# Patient Record
Sex: Male | Born: 1960 | Race: Black or African American | Hispanic: No | Marital: Married | Smoking: Former smoker
Health system: Southern US, Community
[De-identification: ages and names within clinical notes are randomized; demographics above are authoritative.]

## PROBLEM LIST (undated history)

## (undated) DIAGNOSIS — J9811 Atelectasis: Secondary | ICD-10-CM

## (undated) DIAGNOSIS — I10 Essential (primary) hypertension: Secondary | ICD-10-CM

## (undated) HISTORY — PX: COLONOSCOPY: SHX174

---

## 1998-07-19 ENCOUNTER — Encounter: Admission: RE | Admit: 1998-07-19 | Discharge: 1998-10-17 | Payer: Self-pay | Admitting: Orthopedic Surgery

## 2002-10-15 ENCOUNTER — Emergency Department (HOSPITAL_COMMUNITY): Admission: EM | Admit: 2002-10-15 | Discharge: 2002-10-15 | Payer: Self-pay | Admitting: Emergency Medicine

## 2002-10-15 ENCOUNTER — Encounter: Payer: Self-pay | Admitting: Emergency Medicine

## 2004-01-03 ENCOUNTER — Encounter: Admission: RE | Admit: 2004-01-03 | Discharge: 2004-01-03 | Payer: Self-pay | Admitting: *Deleted

## 2005-12-07 ENCOUNTER — Emergency Department (HOSPITAL_COMMUNITY): Admission: EM | Admit: 2005-12-07 | Discharge: 2005-12-07 | Payer: Self-pay | Admitting: Family Medicine

## 2013-12-23 ENCOUNTER — Emergency Department (HOSPITAL_COMMUNITY): Payer: Self-pay

## 2013-12-23 ENCOUNTER — Encounter (HOSPITAL_COMMUNITY): Payer: Self-pay | Admitting: Emergency Medicine

## 2013-12-23 ENCOUNTER — Emergency Department (HOSPITAL_COMMUNITY)
Admission: EM | Admit: 2013-12-23 | Discharge: 2013-12-23 | Disposition: A | Discharge status: Home / Self Care | Payer: Self-pay | Attending: Emergency Medicine | Admitting: Emergency Medicine

## 2013-12-23 ENCOUNTER — Emergency Department (INDEPENDENT_AMBULATORY_CARE_PROVIDER_SITE_OTHER)
Admission: EM | Admit: 2013-12-23 | Discharge: 2013-12-23 | Disposition: A | Discharge status: Nursing Home (Includes ICF) | Payer: Self-pay | Source: Home / Self Care

## 2013-12-23 DIAGNOSIS — Z79899 Other long term (current) drug therapy: Secondary | ICD-10-CM | POA: Insufficient documentation

## 2013-12-23 DIAGNOSIS — R100 Acute abdomen: Secondary | ICD-10-CM

## 2013-12-23 DIAGNOSIS — R109 Unspecified abdominal pain: Secondary | ICD-10-CM

## 2013-12-23 DIAGNOSIS — F17200 Nicotine dependence, unspecified, uncomplicated: Secondary | ICD-10-CM | POA: Insufficient documentation

## 2013-12-23 DIAGNOSIS — R1013 Epigastric pain: Secondary | ICD-10-CM | POA: Insufficient documentation

## 2013-12-23 DIAGNOSIS — I10 Essential (primary) hypertension: Secondary | ICD-10-CM | POA: Insufficient documentation

## 2013-12-23 DIAGNOSIS — R1110 Vomiting, unspecified: Secondary | ICD-10-CM

## 2013-12-23 DIAGNOSIS — K2920 Alcoholic gastritis without bleeding: Secondary | ICD-10-CM | POA: Insufficient documentation

## 2013-12-23 HISTORY — DX: Essential (primary) hypertension: I10

## 2013-12-23 LAB — URINALYSIS, ROUTINE W REFLEX MICROSCOPIC
Bilirubin Urine: NEGATIVE
Glucose, UA: NEGATIVE mg/dL
Ketones, ur: >80 mg/dL — AB
Leukocytes, UA: NEGATIVE
Protein, ur: 100 mg/dL — AB
Specific Gravity, Urine: 1.029 (ref 1.005–1.030)
pH: 5.5 (ref 5.0–8.0)

## 2013-12-23 LAB — CBC WITH DIFFERENTIAL/PLATELET
Basophils Absolute: 0 10*3/uL (ref 0.0–0.1)
Eosinophils Absolute: 0 10*3/uL (ref 0.0–0.7)
Eosinophils Relative: 0 % (ref 0–5)
HCT: 45.3 % (ref 39.0–52.0)
Hemoglobin: 15.6 g/dL (ref 13.0–17.0)
Lymphocytes Relative: 5 % — ABNORMAL LOW (ref 12–46)
MCH: 33.2 pg (ref 26.0–34.0)
MCV: 96.4 fL (ref 78.0–100.0)
Monocytes Relative: 4 % (ref 3–12)
Neutro Abs: 14.8 10*3/uL — ABNORMAL HIGH (ref 1.7–7.7)
Platelets: 294 10*3/uL (ref 150–400)
RBC: 4.7 MIL/uL (ref 4.22–5.81)
RDW: 13.1 % (ref 11.5–15.5)
WBC: 16.3 10*3/uL — ABNORMAL HIGH (ref 4.0–10.5)

## 2013-12-23 LAB — COMPREHENSIVE METABOLIC PANEL
ALT: 42 U/L (ref 0–53)
AST: 70 U/L — ABNORMAL HIGH (ref 0–37)
Albumin: 4.9 g/dL (ref 3.5–5.2)
BUN: 19 mg/dL (ref 6–23)
CO2: 22 mEq/L (ref 19–32)
Calcium: 9.3 mg/dL (ref 8.4–10.5)
Creatinine, Ser: 0.92 mg/dL (ref 0.50–1.35)
Sodium: 145 mEq/L (ref 137–147)
Total Bilirubin: 0.4 mg/dL (ref 0.3–1.2)
Total Protein: 8.5 g/dL — ABNORMAL HIGH (ref 6.0–8.3)

## 2013-12-23 LAB — URINE MICROSCOPIC-ADD ON

## 2013-12-23 MED ORDER — FENTANYL CITRATE 0.05 MG/ML IJ SOLN
100.0000 ug | Freq: Once | INTRAMUSCULAR | Status: AC
  Administered 2013-12-23: 100 ug via INTRAVENOUS
  Filled 2013-12-23: qty 2

## 2013-12-23 MED ORDER — ONDANSETRON HCL 4 MG/2ML IJ SOLN
4.0000 mg | Freq: Once | INTRAMUSCULAR | Status: AC
  Administered 2013-12-23: 4 mg via INTRAVENOUS
  Filled 2013-12-23: qty 2

## 2013-12-23 MED ORDER — FENTANYL CITRATE 0.05 MG/ML IJ SOLN
50.0000 ug | Freq: Once | INTRAMUSCULAR | Status: AC
  Administered 2013-12-23: 50 ug via INTRAVENOUS
  Filled 2013-12-23: qty 2

## 2013-12-23 MED ORDER — OXYCODONE-ACETAMINOPHEN 10-325 MG PO TABS: 1.0000 | ORAL_TABLET | ORAL | Status: DC | PRN

## 2013-12-23 MED ORDER — IOHEXOL 300 MG/ML  SOLN
100.0000 mL | Freq: Once | INTRAMUSCULAR | Status: AC | PRN
  Administered 2013-12-23: 100 mL via INTRAVENOUS

## 2013-12-23 MED ORDER — BISOPROLOL-HYDROCHLOROTHIAZIDE 10-6.25 MG PO TABS: 1.0000 | ORAL_TABLET | Freq: Every day | ORAL | Status: DC

## 2013-12-23 MED ORDER — KETOROLAC TROMETHAMINE 30 MG/ML IJ SOLN
30.0000 mg | Freq: Once | INTRAMUSCULAR | Status: AC
  Administered 2013-12-23: 30 mg via INTRAVENOUS
  Filled 2013-12-23: qty 1

## 2013-12-23 MED ORDER — PROMETHAZINE HCL 25 MG PO TABS: 25.0000 mg | ORAL_TABLET | Freq: Four times a day (QID) | ORAL | Status: DC | PRN

## 2013-12-23 MED ORDER — CYCLOBENZAPRINE HCL 10 MG PO TABS: 10.0000 mg | ORAL_TABLET | Freq: Three times a day (TID) | ORAL | Status: DC | PRN

## 2013-12-23 MED ORDER — SODIUM CHLORIDE 0.9 % IV BOLUS (SEPSIS)
1000.0000 mL | Freq: Once | INTRAVENOUS | Status: AC
  Administered 2013-12-23: 1000 mL via INTRAVENOUS

## 2013-12-23 MED ORDER — SODIUM CHLORIDE 0.9 % IV SOLN: Freq: Once | INTRAVENOUS | Status: DC

## 2013-12-23 NOTE — ED Notes (Signed)
Per EMS pt came from UC c/o vomiting and RLQ abdominal pain that started at approximately 1300 today. UC reports no visible blood in vomit. Pt denies diarrhea. Abdomen is rigid. EMS gave 4mg  zofran IV and approximately of NS IV. Pt reports nausea has improved.

## 2013-12-23 NOTE — ED Notes (Signed)
[facility] was contacted with request for transport of this patient, but no truck is currently available.  [facility] was contacted, and an ALS truck has been dispatched @ 14:48.

## 2013-12-23 NOTE — ED Provider Notes (Signed)
CSN: [id number]     Arrival date & time 12/23/13  1523 History   First MD Initiated Contact with Patient 12/23/13 1526     Chief Complaint  Patient presents with  . Abdominal Pain  . Emesis    HPI Pt reports he drinks 1 beer daily and last night he drank 2 cups of liquor with beer. Pt reports he vomited "all morning" more than 20 times - thinks it is due to drinking alcohol last night. Pt reports vomit was yellow after many episodes. No fever or chills.  No diarrhea.  No blood in emesis.  Past Medical History  Diagnosis Date  . Hypertension    History reviewed. No pertinent past surgical history. No family history on file. History  Substance Use Topics  . Smoking status: Current Every Day Smoker  . Smokeless tobacco: Not on file  . Alcohol Use: Yes     Comment: pt reports 1 beer daily    Review of Systems  All other systems reviewed and are negative.    Allergies  Asa  Home Medications   Current Outpatient Rx  Name  Route  Sig  Dispense  Refill  . bisoprolol-hydrochlorothiazide (ZIAC) 10-6.25 MG per tablet   Oral   Take 1 tablet by mouth daily.         . cholecalciferol (VITAMIN D) 1000 UNITS tablet   Oral   Take 1,000 Units by mouth daily.         . Cyanocobalamin (VITAMIN B-12 PO)   Oral   Take 1 tablet by mouth daily.         . vitamin C (ASCORBIC ACID) 500 MG tablet   Oral   Take 500 mg by mouth daily.         . vitamin E 200 UNIT capsule   Oral   Take 400 Units by mouth daily.         . cyclobenzaprine (FLEXERIL) 10 MG tablet   Oral   Take 1 tablet (10 mg total) by mouth 3 (three) times daily as needed for muscle spasms.   30 tablet   0   . oxyCODONE-acetaminophen (PERCOCET) 10-325 MG per tablet   Oral   Take 1 tablet by mouth every 4 (four) hours as needed for pain.   20 tablet   0   . promethazine (PHENERGAN) 25 MG tablet   Oral   Take 1 tablet (25 mg total) by mouth every 6 (six) hours as needed for nausea or vomiting.   30  tablet   0    BP 132/67  Pulse 104  Temp(Src) 98 F (36.7 C) (Oral)  Resp 16  SpO2 96% Physical Exam  Nursing note and vitals reviewed. Constitutional: He is oriented to person, place, and time. He appears well-developed and well-nourished. No distress.  HENT:  Head: Normocephalic and atraumatic.  Eyes: Pupils are equal, round, and reactive to light.  Neck: Normal range of motion.  Cardiovascular: Normal rate and intact distal pulses.   Pulmonary/Chest: No respiratory distress.  Abdominal: Normal appearance. He exhibits no distension. There is tenderness (epigastric). There is no rebound and no guarding.  Musculoskeletal: Normal range of motion.  Neurological: He is alert and oriented to person, place, and time. No cranial nerve deficit.  Skin: Skin is warm and dry. No rash noted.  Psychiatric: He has a normal mood and affect. His behavior is normal.    ED Course  Procedures (including critical care time)  Medications  fentaNYL (SUBLIMAZE)  injection 100 mcg (100 mcg Intravenous Given 12/23/13 1540)  ondansetron (ZOFRAN) injection 4 mg (4 mg Intravenous Given 12/23/13 1540)  iohexol (OMNIPAQUE) 300 MG/ML solution 100 mL (100 mLs Intravenous Contrast Given 12/23/13 1842)  fentaNYL (SUBLIMAZE) injection 100 mcg (100 mcg Intravenous Given 12/23/13 1938)  sodium chloride 0.9 % bolus 1,000 mL (0 mLs Intravenous Stopped 12/23/13 2036)  ondansetron (ZOFRAN) injection 4 mg (4 mg Intravenous Given 12/23/13 2000)  fentaNYL (SUBLIMAZE) injection 50 mcg (50 mcg Intravenous Given 12/23/13 2118)  ketorolac (TORADOL) 30 MG/ML injection 30 mg (30 mg Intravenous Given 12/23/13 2118)    Labs Review Labs Reviewed  COMPREHENSIVE METABOLIC PANEL - Abnormal; Notable for the following:    Glucose, Bld 137 (*)    Total Protein 8.5 (*)    AST 70 (*)    All other components within normal limits  URINALYSIS, ROUTINE W REFLEX MICROSCOPIC - Abnormal; Notable for the following:    Hgb urine dipstick  TRACE (*)    Ketones, ur >80 (*)    Protein, ur 100 (*)    All other components within normal limits  CBC WITH DIFFERENTIAL - Abnormal; Notable for the following:    WBC 16.3 (*)    Neutrophils Relative % 91 (*)    Lymphocytes Relative 5 (*)    Neutro Abs 14.8 (*)    All other components within normal limits  LIPASE, BLOOD  URINE MICROSCOPIC-ADD ON   Imaging Review Ct Abdomen Pelvis W Contrast  12/23/2013   CLINICAL DATA:  Abdominal pain right lower quadrant.  EXAM: CT ABDOMEN AND PELVIS WITH CONTRAST  TECHNIQUE: Multidetector CT imaging of the abdomen and pelvis was performed using the standard protocol following bolus administration of intravenous contrast.  CONTRAST:  OMNIPAQUE IOHEXOL 300 MG/ML  SOLN  COMPARISON:  None.  FINDINGS: Lung bases are unremarkable.  Abdominal images demonstrate minimal fatty infiltration of the liver without focal mass. There is a normal spleen, pancreas, gallbladder and adrenal glands. Kidneys are normal in size as there is a sub cm hypodensity over the mid pole of the right kidney likely a cyst but too small to characterize. Ureters are within normal. The appendix is normal. There is no free fluid or inflammatory change in the right lower quadrant. There is no evidence of bowel obstruction.  Pelvic images demonstrate no focal abnormality. There is normal spondylosis of the spine.  IMPRESSION: No acute findings in the abdomen/pelvis. No evidence of acute appendicitis.  Minimal fatty infiltration of the liver.  Subcentimeter hypodensity over the mid pole the right kidney too small to characterize but likely a cyst.   Electronically Signed   By: Elberta Fortis M.D.   On: 12/23/2013 18:55   Dg Abd Acute W/chest  12/23/2013   CLINICAL DATA:  Abdominal pain. Nausea and vomiting. Hypertension. Smoker.  EXAM: ACUTE ABDOMEN SERIES (ABDOMEN 2 VIEW & CHEST 1 VIEW)  COMPARISON:  None.  FINDINGS: There is no evidence of dilated bowel loops or free intraperitoneal air. No  radiopaque calculi or other significant radiographic abnormality is seen. Heart size and mediastinal contours are within normal limits. Both lungs are clear.  IMPRESSION: Negative abdominal radiographs.  No acute cardiopulmonary disease.   Electronically Signed   By: Myles Rosenthal M.D.   On: 12/23/2013 16:14    After treatment in the ED the patient feels back to baseline and wants to go home.  MDM   1. Alcoholic gastritis   2. Flank pain   3. Vomiting  Nelia Shi, MD 12/25/13 2154

## 2013-12-23 NOTE — ED Provider Notes (Signed)
CSN: [id number]     Arrival date & time 12/23/13  1412 History   First MD Initiated Contact with Patient 12/23/13 1440     Chief Complaint  Patient presents with  . Abdominal Pain   (Consider location/radiation/quality/duration/timing/severity/associated sxs/prior Treatment) HPI Comments: 52 year old male started with profuse and persistent vomiting since proximal o'clock this morning. Early this afternoon he developed severe acute abdominal pain greatest on the right. He presents now with vomiting, moaning, groaning and writhing in severe pain.  Patient is a 52 y.o. male presenting with abdominal pain.  Abdominal Pain Associated symptoms: nausea and vomiting     Past Medical History  Diagnosis Date  . Hypertension    History reviewed. No pertinent past surgical history. No family history on file. History  Substance Use Topics  . Smoking status: Current Every Day Smoker  . Smokeless tobacco: Not on file  . Alcohol Use: Yes    Review of Systems  Constitutional: Positive for activity change and appetite change.  HENT: Negative.   Respiratory: Negative.   Cardiovascular: Negative.   Gastrointestinal: Positive for nausea, vomiting and abdominal pain. Negative for abdominal distention.  Genitourinary: Negative.   Neurological: Negative.     Allergies  Asa  Home Medications  No current outpatient prescriptions on file. BP 172/91  Pulse 122  Temp(Src) 98.8 F (37.1 C) (Oral)  Resp 26  SpO2 99% Physical Exam  Nursing note and vitals reviewed. Constitutional: He is oriented to person, place, and time. He appears well-developed and well-nourished. He appears distressed.  Neck: Normal range of motion. Neck supple.  Cardiovascular: Normal heart sounds.   tachycardic  Pulmonary/Chest: Breath sounds normal. He has no wheezes.  Hyperventilating, lungs are clear.  Abdominal: There is tenderness.  Abdomen is flat and rigid. Palpation to any portion of the abdomen produces  greater pain.  Musculoskeletal: He exhibits no edema.  Neurological: He is alert and oriented to person, place, and time. He exhibits normal muscle tone.  Skin:  Clammy and warm    ED Course  Procedures (including critical care time) Labs Review Labs Reviewed - No data to display Imaging Review No results found.    MDM   1. Acute abdomen   2. Abdominal pain     Transfer this 52 year old male with severe abdominal pain, rigid abdomen and vomiting suspected of having an acute or surgical abdomen to the [facility] by EMS    Hayden Rasmussen, NP 12/23/13 1451

## 2013-12-23 NOTE — ED Notes (Signed)
Pt completed oral contrast. CT notified

## 2013-12-23 NOTE — ED Notes (Signed)
52 yr old male is here today with complaints of sever RLQ pain that started this morning. Denies: SOB, chest pain

## 2013-12-23 NOTE — ED Notes (Signed)
Pt reports he drinks 1 beer daily and last night he drank 2 cups of liquor with beer. Pt reports he vomited "all morning" more than 20 times - thinks it is due to drinking alcohol last night. Pt reports vomit was yellow after many episodes.

## 2013-12-27 NOTE — ED Provider Notes (Signed)
Medical screening examination/treatment/procedure(s) were performed by a resident physician or non-physician practitioner and as the supervising physician I was immediately available for consultation/collaboration.  Gayland Nicol, MD    Logyn Dedominicis S Marquesha Robideau, MD 12/27/13 0844 

## 2017-02-26 ENCOUNTER — Encounter (HOSPITAL_COMMUNITY): Payer: Self-pay | Admitting: Emergency Medicine

## 2017-02-26 ENCOUNTER — Inpatient Hospital Stay (HOSPITAL_COMMUNITY): Payer: 59

## 2017-02-26 ENCOUNTER — Emergency Department (HOSPITAL_COMMUNITY): Payer: 59

## 2017-02-26 ENCOUNTER — Inpatient Hospital Stay (HOSPITAL_COMMUNITY)
Admission: EM | Admit: 2017-02-26 | Discharge: 2017-03-01 | DRG: 201 | Disposition: A | Discharge status: Home / Self Care | Payer: 59 | Attending: Family Medicine | Admitting: Family Medicine

## 2017-02-26 DIAGNOSIS — Z886 Allergy status to analgesic agent status: Secondary | ICD-10-CM | POA: Diagnosis not present

## 2017-02-26 DIAGNOSIS — I10 Essential (primary) hypertension: Secondary | ICD-10-CM

## 2017-02-26 DIAGNOSIS — Z9689 Presence of other specified functional implants: Secondary | ICD-10-CM

## 2017-02-26 DIAGNOSIS — J439 Emphysema, unspecified: Secondary | ICD-10-CM | POA: Diagnosis not present

## 2017-02-26 DIAGNOSIS — Z79899 Other long term (current) drug therapy: Secondary | ICD-10-CM | POA: Diagnosis not present

## 2017-02-26 DIAGNOSIS — F17200 Nicotine dependence, unspecified, uncomplicated: Secondary | ICD-10-CM | POA: Diagnosis not present

## 2017-02-26 DIAGNOSIS — J939 Pneumothorax, unspecified: Secondary | ICD-10-CM

## 2017-02-26 DIAGNOSIS — J9383 Other pneumothorax: Principal | ICD-10-CM | POA: Diagnosis present

## 2017-02-26 DIAGNOSIS — R0602 Shortness of breath: Secondary | ICD-10-CM | POA: Diagnosis present

## 2017-02-26 DIAGNOSIS — F17290 Nicotine dependence, other tobacco product, uncomplicated: Secondary | ICD-10-CM | POA: Diagnosis present

## 2017-02-26 DIAGNOSIS — R079 Chest pain, unspecified: Secondary | ICD-10-CM | POA: Diagnosis not present

## 2017-02-26 DIAGNOSIS — F17209 Nicotine dependence, unspecified, with unspecified nicotine-induced disorders: Secondary | ICD-10-CM | POA: Diagnosis not present

## 2017-02-26 DIAGNOSIS — J9311 Primary spontaneous pneumothorax: Secondary | ICD-10-CM

## 2017-02-26 LAB — CBC WITH DIFFERENTIAL/PLATELET
Basophils Absolute: 0 10*3/uL (ref 0.0–0.1)
Basophils Relative: 1 %
Eosinophils Absolute: 0.1 10*3/uL (ref 0.0–0.7)
Eosinophils Relative: 1 %
HEMATOCRIT: 41.5 % (ref 39.0–52.0)
Hemoglobin: 14.3 g/dL (ref 13.0–17.0)
LYMPHS ABS: 1.9 10*3/uL (ref 0.7–4.0)
Lymphocytes Relative: 39 %
MCH: 33 pg (ref 26.0–34.0)
MCHC: 34.5 g/dL (ref 30.0–36.0)
MCV: 95.8 fL (ref 78.0–100.0)
MONOS PCT: 11 %
Monocytes Absolute: 0.5 10*3/uL (ref 0.1–1.0)
NEUTROS ABS: 2.3 10*3/uL (ref 1.7–7.7)
NEUTROS PCT: 48 %
Platelets: 290 10*3/uL (ref 150–400)
RBC: 4.33 MIL/uL (ref 4.22–5.81)
RDW: 13.1 % (ref 11.5–15.5)
WBC: 4.8 10*3/uL (ref 4.0–10.5)

## 2017-02-26 LAB — RAPID URINE DRUG SCREEN, HOSP PERFORMED
Amphetamines: NOT DETECTED
Barbiturates: NOT DETECTED
Benzodiazepines: NOT DETECTED
Cocaine: NOT DETECTED
Opiates: NOT DETECTED
TETRAHYDROCANNABINOL: POSITIVE — AB

## 2017-02-26 LAB — I-STAT CHEM 8, ED
BUN: 11 mg/dL (ref 6–20)
CREATININE: 1.1 mg/dL (ref 0.61–1.24)
Calcium, Ion: 1.09 mmol/L — ABNORMAL LOW (ref 1.15–1.40)
Chloride: 105 mmol/L (ref 101–111)
Glucose, Bld: 124 mg/dL — ABNORMAL HIGH (ref 65–99)
HCT: 43 % (ref 39.0–52.0)
Hemoglobin: 14.6 g/dL (ref 13.0–17.0)
POTASSIUM: 3.5 mmol/L (ref 3.5–5.1)
Sodium: 142 mmol/L (ref 135–145)
TCO2: 27 mmol/L (ref 0–100)

## 2017-02-26 LAB — I-STAT CG4 LACTIC ACID, ED: Lactic Acid, Venous: 1.89 mmol/L (ref 0.5–1.9)

## 2017-02-26 LAB — COMPREHENSIVE METABOLIC PANEL
ALK PHOS: 47 U/L (ref 38–126)
ALT: 19 U/L (ref 17–63)
ANION GAP: 9 (ref 5–15)
AST: 30 U/L (ref 15–41)
Albumin: 4.4 g/dL (ref 3.5–5.0)
BILIRUBIN TOTAL: 0.5 mg/dL (ref 0.3–1.2)
BUN: 12 mg/dL (ref 6–20)
CALCIUM: 9.1 mg/dL (ref 8.9–10.3)
CO2: 27 mmol/L (ref 22–32)
Chloride: 105 mmol/L (ref 101–111)
Creatinine, Ser: 1.01 mg/dL (ref 0.61–1.24)
GFR calc non Af Amer: >60 mL/min (ref >60)
GLUCOSE: 123 mg/dL — AB (ref 65–99)
Potassium: 3.4 mmol/L — ABNORMAL LOW (ref 3.5–5.1)
Sodium: 141 mmol/L (ref 135–145)
TOTAL PROTEIN: 7.7 g/dL (ref 6.5–8.1)

## 2017-02-26 LAB — ETHANOL: Alcohol, Ethyl (B): 47 mg/dL — ABNORMAL HIGH (ref <5)

## 2017-02-26 LAB — I-STAT TROPONIN, ED: Troponin i, poc: 0 ng/mL (ref 0.00–0.08)

## 2017-02-26 LAB — MAGNESIUM
MAGNESIUM: 2.3 mg/dL (ref 1.7–2.4)
MAGNESIUM: 2.1 mg/dL (ref 1.7–2.4)

## 2017-02-26 LAB — PROTIME-INR
INR: 0.92
Prothrombin Time: 12.3 seconds (ref 11.4–15.2)

## 2017-02-26 LAB — TROPONIN I

## 2017-02-26 MED ORDER — IOPAMIDOL (ISOVUE-370) INJECTION 76%: 100.0000 mL | Freq: Once | INTRAVENOUS | Status: DC | PRN

## 2017-02-26 MED ORDER — LEVALBUTEROL HCL 0.63 MG/3ML IN NEBU
0.6300 mg | INHALATION_SOLUTION | Freq: Four times a day (QID) | RESPIRATORY_TRACT | Status: DC | PRN
Start: 2017-02-26 — End: 2017-03-01

## 2017-02-26 MED ORDER — TRAZODONE HCL 50 MG PO TABS: 50.0000 mg | ORAL_TABLET | Freq: Every evening | ORAL | Status: DC | PRN

## 2017-02-26 MED ORDER — ACETAMINOPHEN 650 MG RE SUPP
650.0000 mg | Freq: Four times a day (QID) | RECTAL | Status: DC | PRN
Start: 2017-02-26 — End: 2017-03-01

## 2017-02-26 MED ORDER — ENOXAPARIN SODIUM 40 MG/0.4ML ~~LOC~~ SOLN
40.0000 mg | SUBCUTANEOUS | Status: DC
  Administered 2017-02-26 – 2017-02-28 (×3): 40 mg via SUBCUTANEOUS
  Filled 2017-02-26 (×3): qty 0.4

## 2017-02-26 MED ORDER — LIDOCAINE HCL (PF) 1 % IJ SOLN
5.0000 mL | Freq: Once | INTRAMUSCULAR | Status: AC
  Administered 2017-02-26: 5 mL via INTRADERMAL
  Filled 2017-02-26: qty 30

## 2017-02-26 MED ORDER — FENTANYL CITRATE (PF) 100 MCG/2ML IJ SOLN
50.0000 ug | Freq: Once | INTRAMUSCULAR | Status: AC
  Administered 2017-02-26: 50 ug via INTRAVENOUS
  Filled 2017-02-26: qty 2

## 2017-02-26 MED ORDER — ONDANSETRON HCL 4 MG/2ML IJ SOLN
4.0000 mg | Freq: Four times a day (QID) | INTRAMUSCULAR | Status: DC | PRN
  Administered 2017-02-26: 4 mg via INTRAVENOUS
  Filled 2017-02-26: qty 2

## 2017-02-26 MED ORDER — IOPAMIDOL (ISOVUE-370) INJECTION 76%
100.0000 mL | Freq: Once | INTRAVENOUS | Status: AC | PRN
  Administered 2017-02-26: 100 mL via INTRAVENOUS

## 2017-02-26 MED ORDER — IOPAMIDOL (ISOVUE-370) INJECTION 76%
INTRAVENOUS | Status: AC
  Filled 2017-02-26: qty 100

## 2017-02-26 MED ORDER — SODIUM CHLORIDE 0.9% FLUSH
3.0000 mL | Freq: Two times a day (BID) | INTRAVENOUS | Status: DC
  Administered 2017-02-26 – 2017-03-01 (×7): 3 mL via INTRAVENOUS

## 2017-02-26 MED ORDER — HYDROMORPHONE HCL 1 MG/ML IJ SOLN
0.5000 mg | Freq: Once | INTRAMUSCULAR | Status: AC
  Administered 2017-02-26: 0.5 mg via INTRAVENOUS
  Filled 2017-02-26: qty 0.5

## 2017-02-26 MED ORDER — ACETAMINOPHEN 325 MG PO TABS: 650.0000 mg | ORAL_TABLET | Freq: Four times a day (QID) | ORAL | Status: DC | PRN

## 2017-02-26 MED ORDER — LOSARTAN POTASSIUM 50 MG PO TABS
100.0000 mg | ORAL_TABLET | Freq: Every day | ORAL | Status: DC
  Administered 2017-02-27 – 2017-03-01 (×3): 100 mg via ORAL
  Filled 2017-02-26 (×3): qty 2

## 2017-02-26 MED ORDER — SODIUM CHLORIDE 0.9 % IV BOLUS (SEPSIS)
1000.0000 mL | Freq: Once | INTRAVENOUS | Status: AC
  Administered 2017-02-26: 1000 mL via INTRAVENOUS

## 2017-02-26 MED ORDER — HYDROMORPHONE HCL 1 MG/ML IJ SOLN: 0.5000 mg | Freq: Once | INTRAMUSCULAR | Status: DC

## 2017-02-26 MED ORDER — ONDANSETRON HCL 4 MG PO TABS: 4.0000 mg | ORAL_TABLET | Freq: Four times a day (QID) | ORAL | Status: DC | PRN

## 2017-02-26 MED ORDER — OXYCODONE HCL 5 MG PO TABS
5.0000 mg | ORAL_TABLET | ORAL | Status: DC | PRN
  Administered 2017-02-26 – 2017-02-28 (×11): 5 mg via ORAL
  Filled 2017-02-26 (×11): qty 1

## 2017-02-26 NOTE — ED Provider Notes (Signed)
[facility]-EMERGENCY DEPT Provider Note   CSN: [id number] Arrival date & time: 02/26/17  4098     History   Chief Complaint Chief Complaint  Patient presents with  . Chest Pain     HPI   Ray [name] is a 55 y.o. male complaining of acute severe back pain radiating around to the right chest proximally 1 hour prior to arrival with associated lightheadedness, diaphoresis. He says it is pressure-like and severe, exacerbated by movement and palpation. Patient states he is a smoker, history of hypertension, no diabetes, hyperlipidemia, family history of ACS, history of DVT/PE. Nice abdominal pain, shortness of breath, syncope, similar prior episodes, history of cardiac issues.  Past Medical History:  Diagnosis Date  . Hypertension     There are no active problems to display for this patient.   History reviewed. No pertinent surgical history.    Home Medications    Prior to Admission medications   Medication Sig Start Date End Date Taking? Authorizing Provider  bisoprolol-hydrochlorothiazide (ZIAC) 10-6.25 MG per tablet Take 1 tablet by mouth daily. Patient taking differently: Take 1 tablet by mouth every morning.  12/23/13  Yes Nelva Nay, MD  losartan (COZAAR) 100 MG tablet Take 100 mg by mouth every morning. 02/16/17  Yes Historical Provider, MD  Multiple Vitamins-Minerals (MULTIVITAMIN MEN 50+ PO) Take 1 tablet by mouth daily.   Yes Historical Provider, MD  traZODone (DESYREL) 50 MG tablet Take 50 mg by mouth at bedtime as needed for sleep.  01/29/17  Yes Historical Provider, MD    Family History History reviewed. No pertinent family history.  Social History Social History  Substance Use Topics  . Smoking status: Current Every Day Smoker  . Smokeless tobacco: Never Used  . Alcohol use Yes     Comment: pt reports 1 beer daily     Allergies   Asa [aspirin]   Review of Systems Review of Systems  10 systems reviewed and found to be negative, except as noted in the  HPI.  Physical Exam Updated Vital Signs BP 149/87   Pulse 84   Temp 98.1 F (36.7 C) (Oral)   Resp 20   Ht 6' (1.829 m)   Wt 72.6 kg   SpO2 100%   BMI 21.70 kg/m   Physical Exam  Constitutional: He is oriented to person, place, and time. He appears well-developed and well-nourished. He appears distressed.  Mildly distress, diaphoretic  HENT:  Head: Normocephalic and atraumatic.  Mouth/Throat: Oropharynx is clear and moist.  Eyes: Conjunctivae and EOM are normal. Pupils are equal, round, and reactive to light.  Neck: Normal range of motion. No JVD present. No tracheal deviation present.  Cardiovascular: Normal rate, regular rhythm and intact distal pulses.   Radial pulse equal bilaterally  Pulmonary/Chest: Effort normal and breath sounds normal. No stridor. No respiratory distress. He has no wheezes. He has no rales. He exhibits no tenderness.  Abdominal: Soft. He exhibits no distension and no mass. There is no tenderness. There is no rebound and no guarding.  Musculoskeletal: Normal range of motion. He exhibits no edema or tenderness.  No calf asymmetry, superficial collaterals, palpable cords, edema, Homans sign negative bilaterally.    Neurological: He is alert and oriented to person, place, and time.  Skin: Skin is warm. He is diaphoretic.  Psychiatric: He has a normal mood and affect.  Nursing note and vitals reviewed.    ED Treatments / Results  Labs (all labs ordered are listed, but only abnormal results are displayed)  Labs Reviewed  COMPREHENSIVE METABOLIC PANEL - Abnormal; Notable for the following:       Result Value   Potassium 3.4 (*)    Glucose, Bld 123 (*)    All other components within normal limits  ETHANOL - Abnormal; Notable for the following:    Alcohol, Ethyl (B) 47 (*)    All other components within normal limits  I-STAT CHEM 8, ED - Abnormal; Notable for the following:    Glucose, Bld 124 (*)    Calcium, Ion 1.09 (*)    All other components  within normal limits  CBC WITH DIFFERENTIAL/PLATELET  PROTIME-INR  MAGNESIUM  RAPID URINE DRUG SCREEN, HOSP PERFORMED  I-STAT TROPOININ, ED  I-STAT CG4 LACTIC ACID, ED    EKG  EKG Interpretation  Date/Time:  Tuesday February 26 2017 06:50:52 EST Ventricular Rate:  103 PR Interval:    QRS Duration: 82 QT Interval:  329 QTC Calculation: 431 R Axis:   87 Text Interpretation:  Sinus tachycardia Prolonged PR interval rate is faster Confirmed by Erroll Luna [phone]) on 02/26/2017 6:56:44 AM       Radiology Dg Chest Portable 2 Views  Result Date: 02/26/2017 CLINICAL DATA:  Right chest tube insertion EXAM: CHEST  2 VIEW PORTABLE COMPARISON:  02/26/2017 FINDINGS: Interval placement of pleural pigtail chest tube. The locking pigtail loop is partially formed in the medial right hemithorax. Interval partial re-expansion of the right lung. Moderate right lateral basilar pneumothorax remains, approximately 15%. Heart is normal size. No confluent airspace opacities. IMPRESSION: Partial re-expansion of the right lung with placement of pigtail pleural drainage catheter. Approximately 15% right basilar pneumothorax remains. Electronically Signed   By: Charlett Nose M.D.   On: 02/26/2017 08:49   Dg Chest Port 1 View  Result Date: 02/26/2017 CLINICAL DATA:  Chest pain and shortness of breath. EXAM: PORTABLE CHEST 1 VIEW COMPARISON:  Single-view of the chest 12/23/2013. FINDINGS: The patient has a very large right pneumothorax with complete collapse the right lung. There is minimal right to left midline shift CROWDING OF THE bronchovascular structures in the left chest is noted. Heart size is normal. IMPRESSION: Large right pneumothorax with mild right to left midline shift. Critical Value/emergent results were called by telephone at the time of interpretation on 02/26/2017 at 7:36 am to Dr. Theda Belfast , who verbally acknowledged these results. Electronically Signed   By: Drusilla Kanner M.D.   On:  02/26/2017 07:37     CRITICAL CARE Performed by: Wynetta Emery   Total critical care time: 45 minutes  Critical care time was exclusive of separately billable procedures and treating other patients.  Critical care was necessary to treat or prevent imminent or life-threatening deterioration.  Critical care was time spent personally by me on the following activities: development of treatment plan with patient and/or surrogate as well as nursing, discussions with consultants, evaluation of patient's response to treatment, examination of patient, obtaining history from patient or surrogate, ordering and performing treatments and interventions, ordering and review of laboratory studies, ordering and review of radiographic studies, pulse oximetry and re-evaluation of patient's condition.   Procedures CHEST TUBE INSERTION Date/Time: 02/26/2017 9:13 AM Performed by: Wynetta Emery Authorized by: Wynetta Emery   Consent:    Consent obtained:  Written   Consent given by:  Patient   Risks discussed:  Damage to surrounding structures, infection, pain, bleeding, incomplete drainage and nerve damage   Alternatives discussed:  No treatment, delayed treatment and alternative treatment Pre-procedure details:  Skin preparation:  ChloraPrep   Preparation: Patient was prepped and draped in the usual sterile fashion   Procedure details:    Placement location:  R lateral   Scalpel size:  11   Tube size ([place]): 10.   Dissection instrument: N/A: pigtail with guidwire.   Tube connected to:  Suction and water seal   Drainage characteristics:  Air only   Suture material:  2-0 silk   Dressing:  Petrolatum-impregnated gauze and 4x4 sterile gauze Post-procedure details:    Post-insertion x-ray findings: tube in good position     Patient tolerance of procedure:  Tolerated well, no immediate complications   (including critical care time)  Medications Ordered in ED Medications  iopamidol  (ISOVUE-370) 76 % injection (not administered)  iopamidol (ISOVUE-370) 76 % injection 100 mL (not administered)  fentaNYL (SUBLIMAZE) injection 50 mcg (50 mcg Intravenous Given 02/26/17 0706)  sodium chloride 0.9 % bolus 1,000 mL (0 mLs Intravenous Stopped 02/26/17 0800)  lidocaine (PF) (XYLOCAINE) 1 % injection 5 mL (5 mLs Intradermal Given 02/26/17 0839)  HYDROmorphone (DILAUDID) injection 0.5 mg (0.5 mg Intravenous Given 02/26/17 0932)     Initial Impression / Assessment and Plan / ED Course  I have reviewed the triage vital signs and the nursing notes.  Pertinent labs & imaging results that were available during my care of the patient were reviewed by me and considered in my medical decision making (see chart for details).     Vitals:   02/26/17 0815 02/26/17 0830 02/26/17 0900 02/26/17 0930  BP: 161/87 145/88 141/92 149/87  Pulse: 94 87 88 84  Resp: (!) 29 25 24 20   Temp:      TempSrc:      SpO2: 97% 100% 100% 100%  Weight:      Height:        Medications  iopamidol (ISOVUE-370) 76 % injection (not administered)  iopamidol (ISOVUE-370) 76 % injection 100 mL (not administered)  fentaNYL (SUBLIMAZE) injection 50 mcg (50 mcg Intravenous Given 02/26/17 0706)  sodium chloride 0.9 % bolus 1,000 mL (0 mLs Intravenous Stopped 02/26/17 0800)  lidocaine (PF) (XYLOCAINE) 1 % injection 5 mL (5 mLs Intradermal Given 02/26/17 0839)  HYDROmorphone (DILAUDID) injection 0.5 mg (0.5 mg Intravenous Given 02/26/17 0932)    Dia Sitteryrone Barnier is 56 y.o. male presenting with 2 severe right-sided chest pain onset 1 hour prior to arrival with associated diaphoresis and feeling lightheaded. Patient has no shortness of breath or syncope. Pulses equal bilaterally, chest x-ray with a right sided pneumothorax approximately 80% with no tension component. Vital signs stable. Lungs sounds audible on both sides.  Pigtail drain catheter inserted under supervision of attending physician with out complication. X-ray shows a  significant reduction in the pneumothorax, patient's vital signs remained normal, he is comfortable with some back pain.  Cardiothoracic surgery from Dr. Dorris FetchHendrickson appreciated: He states that since this patient are he has the tube they can be admitted to hospitalist and they will consult. Case discussed with Dr. Susie CassetteAbrol who accepts admission.  Final Clinical Impressions(s) / ED Diagnoses   Final diagnoses:  Spontaneous pneumothorax    New Prescriptions New Prescriptions   No medications on file     Wynetta Emeryicole Kyshaun Barnette, PA-C 02/26/17 [id number]    Canary Brimhristopher J Tegeler, MD 02/28/17 [phone]

## 2017-02-26 NOTE — Progress Notes (Signed)
Chest tube output 34mL

## 2017-02-26 NOTE — ED Triage Notes (Signed)
Pt reports having pressure in substernal chest with radiation into back that started appx 1 hr prior to arrival while in shower. Pt diaphoretic and short of breath on triage.

## 2017-02-26 NOTE — H&P (Addendum)
[facility] History and Physical  Ray [name] RUE:[id number] DOB: [birth year] DOA: 02/26/2017  Referring physician:   PCP: Evlyn Courier, MD   Chief Complaint: Chest pain and back pain  HPI:  56 year old male with a history of hypertension, who smokes an occasional cigar, presents to the ER today with acute onset of right-sided back pain, that started spontaneously this morning. Pain subsequently radiated into his chest and substernal area. Pain was exacerbated by movement. Patient then started experiencing diaphoresis, lightheadedness, chest heaviness. He denies any preceding viral URI symptoms. Denies any preceding spasmodic cough, fever. No prior history of lung disease. Denies any recent weight loss. No prior history of cystic fibrosis ER course BP 149/87   Pulse 84   Temp 98.1 F (36.7 C) (Oral)   Resp 20   Ht 6' (1.829 m)   Wt 72.6 kg   SpO2 100%    Patient found to be in significant respiratory distress and diaphoretic on presentation. chest x-ray with a right sided pneumothorax approximately 80% with no tension component. Pigtail drain catheter inserted  . Cardiothoracic surgery called by EDP, Dr. Dorris Fetch notified: He states that since this patient are he has the tube they can be admitted to hospitalist and they will consult.     Review of Systems: negative for the following  Constitutional: Denies fever, chills, diaphoresis, appetite change and fatigue.  HEENT: Denies photophobia, eye pain, redness, hearing loss, ear pain, congestion, sore throat, rhinorrhea, sneezing, mouth sores, trouble swallowing, neck pain, neck stiffness and tinnitus.  Respiratory: Positive for SOB, DOE, cough, chest tightness, and wheezing.  Cardiovascular: Denies chest pain, palpitations and leg swelling.  Gastrointestinal: Denies nausea, vomiting, abdominal pain, diarrhea, constipation, blood in stool and abdominal distention.  Genitourinary: Denies dysuria, urgency, frequency, hematuria,  flank pain and difficulty urinating.  Musculoskeletal: Denies myalgias, back pain, joint swelling, arthralgias and gait problem.  Skin: Denies pallor, rash and wound.  Neurological: Denies dizziness, seizures, syncope, weakness, light-headedness, numbness and headaches.  Hematological: Denies adenopathy. Easy bruising, personal or family bleeding history  Psychiatric/Behavioral: Denies suicidal ideation, mood changes, confusion, nervousness, sleep disturbance and agitation       Past Medical History:  Diagnosis Date  . Hypertension      History reviewed. No pertinent surgical history.    Social History:  reports that he has been smoking.  He has never used smokeless tobacco. He reports that he drinks alcohol. He reports that he uses drugs, including Marijuana.    Allergies  Allergen Reactions  . Asa [Aspirin] Nausea Only        FAMILY HISTORY  When questioned  Directly-patient reports  No family history of HTN, CVA ,DIABETES, TB, Cancer CAD, Bleeding Disorders, Sickle Cell, diabetes, anemia, asthma,   Prior to Admission medications   Medication Sig Start Date End Date Taking? Authorizing Provider  bisoprolol-hydrochlorothiazide (ZIAC) 10-6.25 MG per tablet Take 1 tablet by mouth daily. Patient taking differently: Take 1 tablet by mouth every morning.  12/23/13  Yes Nelva Nay, MD  losartan (COZAAR) 100 MG tablet Take 100 mg by mouth every morning. 02/16/17  Yes Historical Provider, MD  Multiple Vitamins-Minerals (MULTIVITAMIN MEN 50+ PO) Take 1 tablet by mouth daily.   Yes Historical Provider, MD  traZODone (DESYREL) 50 MG tablet Take 50 mg by mouth at bedtime as needed for sleep.  01/29/17  Yes Historical Provider, MD     Physical Exam: Vitals:   02/26/17 0900 02/26/17 0930 02/26/17 1000 02/26/17 1030  BP: 141/92 149/87 147/83 144/90  Pulse: 88 84 88 82  Resp: 24 20 (!) 27 23  Temp:      TempSrc:      SpO2: 100% 100% 100% 100%  Weight:      Height:           Constitutional: NAD, calm, comfortable Vitals:   02/26/17 0900 02/26/17 0930 02/26/17 1000 02/26/17 1030  BP: 141/92 149/87 147/83 144/90  Pulse: 88 84 88 82  Resp: 24 20 (!) 27 23  Temp:      TempSrc:      SpO2: 100% 100% 100% 100%  Weight:      Height:       Eyes: PERRL, lids and conjunctivae normal ENMT: Mucous membranes are moist. Posterior pharynx clear of any exudate or lesions.Normal dentition.  Neck: normal, supple, no masses, no thyromegaly Respiratory: clear to auscultation bilaterally, no wheezing, no crackles. Normal respiratory effort. No accessory muscle use.  Cardiovascular: Regular rate and rhythm, no murmurs / rubs / gallops. No extremity edema. 2+ pedal pulses. No carotid bruits.  Abdomen: no tenderness, no masses palpated. No hepatosplenomegaly. Bowel sounds positive.  Musculoskeletal: no clubbing / cyanosis. No joint deformity upper and lower extremities. Good ROM, no contractures. Normal muscle tone.  Skin: no rashes, lesions, ulcers. No induration Neurologic: CN 2-12 grossly intact. Sensation intact, DTR normal. Strength 5/5 in all 4.  Psychiatric: Normal judgment and insight. Alert and oriented x 3. Normal mood.     Labs on Admission: I have personally reviewed following labs and imaging studies  CBC:  Recent Labs Lab 02/26/17 0650 02/26/17 0701  WBC 4.8  --   NEUTROABS 2.3  --   HGB 14.3 14.6  HCT 41.5 43.0  MCV 95.8  --   PLT 290  --     Basic Metabolic Panel:  Recent Labs Lab 02/26/17 0650 02/26/17 0700 02/26/17 0701  NA 141  --  142  K 3.4*  --  3.5  CL 105  --  105  CO2 27  --   --   GLUCOSE 123*  --  124*  BUN 12  --  11  CREATININE 1.01  --  1.10  CALCIUM 9.1  --   --   MG 2.3 2.1  --     GFR: Estimated Creatinine Clearance: 79.8 mL/min (by C-G formula based on SCr of 1.1 mg/dL).  Liver Function Tests:  Recent Labs Lab 02/26/17 0650  AST 30  ALT 19  ALKPHOS 47  BILITOT 0.5  PROT 7.7  ALBUMIN 4.4   No  results for input(s): LIPASE, AMYLASE in the last 168 hours. No results for input(s): AMMONIA in the last 168 hours.  Coagulation Profile:  Recent Labs Lab 02/26/17 0650  INR 0.92   No results for input(s): DDIMER in the last 72 hours.  Cardiac Enzymes: No results for input(s): CKTOTAL, CKMB, CKMBINDEX, TROPONINI in the last 168 hours.  BNP (last 3 results) No results for input(s): PROBNP in the last 8760 hours.  HbA1C: No results for input(s): HGBA1C in the last 72 hours. No results found for: HGBA1C   CBG: No results for input(s): GLUCAP in the last 168 hours.  Lipid Profile: No results for input(s): CHOL, HDL, LDLCALC, TRIG, CHOLHDL, LDLDIRECT in the last 72 hours.  Thyroid Function Tests: No results for input(s): TSH, T4TOTAL, FREET4, T3FREE, THYROIDAB in the last 72 hours.  Anemia Panel: No results for input(s): VITAMINB12, FOLATE, FERRITIN, TIBC, IRON, RETICCTPCT in the last 72 hours.  Urine analysis:  Component Value Date/Time   COLORURINE YELLOW 12/23/2013 1646   APPEARANCEUR CLEAR 12/23/2013 1646   LABSPEC 1.029 12/23/2013 1646   PHURINE 5.5 12/23/2013 1646   GLUCOSEU NEGATIVE 12/23/2013 1646   HGBUR TRACE (A) 12/23/2013 1646   BILIRUBINUR NEGATIVE 12/23/2013 1646   KETONESUR >80 (A) 12/23/2013 1646   PROTEINUR 100 (A) 12/23/2013 1646   UROBILINOGEN 0.2 12/23/2013 1646   NITRITE NEGATIVE 12/23/2013 1646   LEUKOCYTESUR NEGATIVE 12/23/2013 1646    Sepsis Labs: @LABRCNTIP (procalcitonin:4,lacticidven:4) )No results found for this or any previous visit (from the past 240 hour(s)).       Radiological Exams on Admission: Dg Chest Portable 2 Views  Result Date: 02/26/2017 CLINICAL DATA:  Right chest tube insertion EXAM: CHEST  2 VIEW PORTABLE COMPARISON:  02/26/2017 FINDINGS: Interval placement of pleural pigtail chest tube. The locking pigtail loop is partially formed in the medial right hemithorax. Interval partial re-expansion of the right lung.  Moderate right lateral basilar pneumothorax remains, approximately 15%. Heart is normal size. No confluent airspace opacities. IMPRESSION: Partial re-expansion of the right lung with placement of pigtail pleural drainage catheter. Approximately 15% right basilar pneumothorax remains. Electronically Signed   By: Charlett Nose M.D.   On: 02/26/2017 08:49   Dg Chest Port 1 View  Result Date: 02/26/2017 CLINICAL DATA:  Chest pain and shortness of breath. EXAM: PORTABLE CHEST 1 VIEW COMPARISON:  Single-view of the chest 12/23/2013. FINDINGS: The patient has a very large right pneumothorax with complete collapse the right lung. There is minimal right to left midline shift CROWDING OF THE bronchovascular structures in the left chest is noted. Heart size is normal. IMPRESSION: Large right pneumothorax with mild right to left midline shift. Critical Value/emergent results were called by telephone at the time of interpretation on 02/26/2017 at 7:36 am to Dr. Theda Belfast , who verbally acknowledged these results. Electronically Signed   By: Drusilla Kanner M.D.   On: 02/26/2017 07:37   Dg Chest Portable 2 Views  Result Date: 02/26/2017 CLINICAL DATA:  Right chest tube insertion EXAM: CHEST  2 VIEW PORTABLE COMPARISON:  02/26/2017 FINDINGS: Interval placement of pleural pigtail chest tube. The locking pigtail loop is partially formed in the medial right hemithorax. Interval partial re-expansion of the right lung. Moderate right lateral basilar pneumothorax remains, approximately 15%. Heart is normal size. No confluent airspace opacities. IMPRESSION: Partial re-expansion of the right lung with placement of pigtail pleural drainage catheter. Approximately 15% right basilar pneumothorax remains. Electronically Signed   By: Charlett Nose M.D.   On: 02/26/2017 08:49   Dg Chest Port 1 View  Result Date: 02/26/2017 CLINICAL DATA:  Chest pain and shortness of breath. EXAM: PORTABLE CHEST 1 VIEW COMPARISON:  Single-view of the  chest 12/23/2013. FINDINGS: The patient has a very large right pneumothorax with complete collapse the right lung. There is minimal right to left midline shift CROWDING OF THE bronchovascular structures in the left chest is noted. Heart size is normal. IMPRESSION: Large right pneumothorax with mild right to left midline shift. Critical Value/emergent results were called by telephone at the time of interpretation on 02/26/2017 at 7:36 am to Dr. Theda Belfast , who verbally acknowledged these results. Electronically Signed   By: Drusilla Kanner M.D.   On: 02/26/2017 07:37      EKG: Independently reviewed. * EKG Interpretation  Date/Time:                  Tuesday February 26 2017 06:50:52 EST Ventricular Rate:  103 PR Interval:                        QRS Duration:        82 QT Interval:                      329 QTC Calculation:    431 R Axis:                         87 Text Interpretation:  Sinus tachycardia Prolonged PR interval rate is faster    Assessment/Plan Principal Problem:   Spontaneous Pneumothorax, without tension pneumothorax Status post insertion of pigtail pleural catheter Resolution of spontaneous pneumothorax from 80% to 15% Cardiothoracic surgery notified  Will admit to telemetry CT scan to rule out potential underlying causes such as-chronic obstructive pulmonary disease with emphysema, cystic fibrosis, tuberculosis, lung cancer and HIV-associated Pneumocystis carinii pneumonia, followed by more rare but "typical" disorders, such as lymphangioleiomyomatosis and histiocytosis X  We'll also check HIV antibody   Chest pain Likely secondary to spontaneous pneumothorax Given history of hypertension will obtain 2-D echo to rule out wall motion abnormalities Monitor on telemetry Continue to cycle cardiac enzymes     Hypertension-continue losartan.     Nicotine dependence-occasional cigar, smoking cessation counseling has been done     DVT prophylaxis:  Lovenox      Code Status Orders  Full code        consults called:  Family Communication: Admission, patients condition and plan of care including tests being ordered have been discussed with the patient/mother   who indicates understanding and agree with the plan and Code Status  Admission status: inpatient    Disposition plan: Further plan will depend as patient's clinical course evolves and further radiologic and laboratory data become available. Likely home when stable   At the time of admission, it appears that the appropriate admission status for this patient is INPATIENT .Thisis judged to be reasonable and necessary in order to provide the required intensity of service to ensure the patient's safetygiven thepresenting symptoms, physical exam findings, and initial radiographic and laboratory data in the context of their chronic comorbidities.   Richarda OverlieABROL,Kilyn Maragh MD [facility] Pager [phone]  If 7PM-7AM, please contact night-coverage [email or web address] Password [email or web address]  02/26/2017, 10:46 AM

## 2017-02-26 NOTE — ED Notes (Signed)
Patient transported to CT 

## 2017-02-26 NOTE — ED Notes (Signed)
Hospitalist at bedside 

## 2017-02-26 NOTE — Progress Notes (Signed)
[address]       [place] [address]             [phone]        Ray [name] [facility] Medical Record #[id number] Date of Birth: 11/03/1961  Referring: [facility] ER  Primary Care: Evlyn Courier, MD  Chief Complaint:    Chief Complaint  Patient presents with  . Chest Pain    History of Present Illness:     Pt is 56 yo male came to ER this am. He  reports having pressure in substernal chest with radiation into back that started appx 1 hr prior to arrival while in shower. Pt diaphoretic and short of breath on triage. He says it is pressure-like and severe, exacerbated by movement and palpation. Patient very intermittent cigar  Smoker. He has  history of hypertension, no diabetes, hyperlipidemia, family history of ACS, history of DVT/PE.  No  similar prior episodes of PTX , history of cardiac issues.  Current Activity/ Functional Status: Patient is independent with mobility/ambulation, transfers, ADL's, IADL's.   Zubrod Score: At the time of surgery this patient's most appropriate activity status/level should be described as: [x]     0    Normal activity, no symptoms []     1    Restricted in physical strenuous activity but ambulatory, able to do out light work []     2    Ambulatory and capable of self care, unable to do work activities, up and about                 more than 50%  Of the time                            []     3    Only limited self care, in bed greater than 50% of waking hours []     4    Completely disabled, no self care, confined to bed or chair []     5    Moribund  Past Medical History:  Diagnosis Date  . Hypertension     History reviewed. No pertinent surgical history.  History  Smoking Status  . Current Every Day Smoker  Smokeless Tobacco  . Never Used   History  Alcohol Use  . Yes    Comment: pt reports 1 beer daily    Social History   Social History  . Marital status: Married    Spouse name: N/A  . Number of children:  N/A  . Years of education: N/A   Occupational History  . Not on file.   Social History Main Topics  . Smoking status: Current Every Day Smoker  . Smokeless tobacco: Never Used  . Alcohol use Yes     Comment: pt reports 1 beer daily  . Drug use: Yes    Types: Marijuana  . Sexual activity: Yes      Allergies  Allergen Reactions  . Asa [Aspirin] Nausea Only    Current Facility-Administered Medications  Medication Dose Route Frequency Provider Last Rate Last Dose  . acetaminophen (TYLENOL) tablet 650 mg  650 mg Oral Q6H PRN Richarda Overlie, MD       Or  . acetaminophen (TYLENOL) suppository 650 mg  650 mg Rectal Q6H PRN Richarda Overlie, MD      . enoxaparin (LOVENOX) injection 40 mg  40 mg Subcutaneous Q24H Richarda Overlie, MD      .  iopamidol (ISOVUE-370) 76 % injection           . levalbuterol (XOPENEX) nebulizer solution 0.63 mg  0.63 mg Nebulization Q6H PRN Richarda Overlie, MD      . ondansetron (ZOFRAN) tablet 4 mg  4 mg Oral Q6H PRN Richarda Overlie, MD       Or  . ondansetron (ZOFRAN) injection 4 mg  4 mg Intravenous Q6H PRN Richarda Overlie, MD      . oxyCODONE (Oxy IR/ROXICODONE) immediate release tablet 5 mg  5 mg Oral Q4H PRN Richarda Overlie, MD   5 mg at 02/26/17 1156  . sodium chloride flush (NS) 0.9 % injection 3 mL  3 mL Intravenous Q12H Richarda Overlie, MD   3 mL at 02/26/17 1203   Current Outpatient Prescriptions  Medication Sig Dispense Refill  . bisoprolol-hydrochlorothiazide (ZIAC) 10-6.25 MG per tablet Take 1 tablet by mouth daily. (Patient taking differently: Take 1 tablet by mouth every morning. ) 30 tablet 0  . losartan (COZAAR) 100 MG tablet Take 100 mg by mouth every morning.  1  . Multiple Vitamins-Minerals (MULTIVITAMIN MEN 50+ PO) Take 1 tablet by mouth daily.    . traZODone (DESYREL) 50 MG tablet Take 50 mg by mouth at bedtime as needed for sleep.   4     (Not in a hospital admission)  History reviewed. No pertinent family history.   Review of Systems:        Cardiac Review of Systems: Y or N  Chest Pain Cove.Etienne    ]  Resting SOB Cove.Etienne   ] Exertional SOB  [ y]  Orthopnea Milo.Brash  ]   Pedal Edema Milo.Brash   ]    Palpitations Milo.Brash  ] Syncope  Milo.Brash  ]   Presyncope [ n  ]  General Review of Systems: [Y] = yes [  ]=no Constitional: recent weight change [  ]; anorexia [  ]; fatigue [  ]; nausea [  ]; night sweats [  ]; fever [  ]; or chills [  ]                                                               Dental: poor dentition[  ]; Last Dentist visit:   Eye : blurred vision [  ]; diplopia [   ]; vision changes [  ];  Amaurosis fugax[  ]; Resp: cough [  ];  wheezing[n  ];  hemoptysis[ n ]; shortness of breath[  ]; paroxysmal nocturnal dyspnea[  ]; dyspnea on exertion[  ]; or orthopnea[  ];  GI:  gallstones[  ], vomiting[  ];  dysphagia[  ]; melena[  ];  hematochezia [  ]; heartburn[  ];   Hx of  Colonoscopy[  ]; GU: kidney stones [  ]; hematuria[  ];   dysuria [  ];  nocturia[  ];  history of     obstruction [  ]; urinary frequency [  ]             Skin: rash, swelling[  ];, hair loss[  ];  peripheral edema[  ];  or itching[  ]; Musculosketetal: myalgias[  ];  joint swelling[  ];  joint erythema[  ];  joint pain[  ];  back pain[  ];  Heme/Lymph: bruising[  ];  bleeding[  ];  anemia[  ];  Neuro: TIA[  ];  headaches[  ];  stroke[  ];  vertigo[  ];  seizures[  ];   paresthesias[  ];  difficulty walking[  ];  Psych:depression[  ]; anxiety[  ];  Endocrine: diabetes[ n ];  thyroid dysfunction[ n ];  Immunizations: Flu Milo.Brash[n  ]; Pneumococcal[n  ];  Other:  Physical Exam: BP 144/90   Pulse 82   Temp 98.1 F (36.7 C) (Oral)   Resp 23   Ht 6' (1.829 m)   Wt 160 lb (72.6 kg)   SpO2 100%   BMI 21.70 kg/m    General appearance: alert, cooperative, appears stated age and no distress Head: Normocephalic, without obvious abnormality, atraumatic Neck: no adenopathy, no carotid bruit, no JVD, supple, symmetrical, trachea midline and thyroid not enlarged, symmetric, no  tenderness/mass/nodules Lymph nodes: Cervical, supraclavicular, and axillary nodes normal. Resp: clear to auscultation bilaterally Back: symmetric, no curvature. ROM normal. No CVA tenderness. Cardio: regular rate and rhythm, S1, S2 normal, no murmur, click, rub or gallop GI: soft, non-tender; bowel sounds normal; no masses,  no organomegaly Extremities: extremities normal, atraumatic, no cyanosis or edema and Homans sign is negative, no sign of DVT Neurologic: Grossly normal Small bore chest tube in the right chest, no air leak   Diagnostic Studies & Laboratory data:     Recent Radiology Findings:   Ct Angio Chest Pe W Or Wo Contrast  Result Date: 02/26/2017 CLINICAL DATA:  Shortness breath. Sudden onset of upper chest pain. Right pneumothorax today. EXAM: CT ANGIOGRAPHY CHEST WITH CONTRAST TECHNIQUE: Multidetector CT imaging of the chest was performed using the standard protocol during bolus administration of intravenous contrast. Multiplanar CT image reconstructions and MIPs were obtained to evaluate the vascular anatomy. CONTRAST:  100 cc Isovue 370 COMPARISON:  Chest x-rays dated 02/26/2017 FINDINGS: Cardiovascular: Satisfactory opacification of the pulmonary arteries to the segmental level. No evidence of pulmonary embolism. Normal heart size. No pericardial effusion. Mediastinum/Nodes: No enlarged mediastinal, hilar, or axillary lymph nodes. Thyroid gland, trachea, and esophagus demonstrate no significant findings. Lungs/Pleura: There is a small anterior basal right pneumothorax. Right-sided chest tube in good position. Multiple small blebs at the lung apices, more on the right than the left. Focal atelectasis and consolidation in the right lower lobe with a small amount of pleural fluid loculated around the right lower lobe. Upper Abdomen: Normal. Musculoskeletal: No chest wall abnormality. No acute or significant osseous findings. Review of the MIP images confirms the above findings.  IMPRESSION: 1. No pulmonary emboli. 2. Multiple small emphysematous blebs at both lung apices. 3. Small residual anterior basal right pneumothorax. 4. Residual atelectasis/consolidation in the right lower lobe with a small amount of fluid loculated along the major fissure. Electronically Signed   By: Francene BoyersJames  Maxwell M.D.   On: 02/26/2017 11:26   Dg Chest Portable 2 Views  Result Date: 02/26/2017 CLINICAL DATA:  Right chest tube insertion EXAM: CHEST  2 VIEW PORTABLE COMPARISON:  02/26/2017 FINDINGS: Interval placement of pleural pigtail chest tube. The locking pigtail loop is partially formed in the medial right hemithorax. Interval partial re-expansion of the right lung. Moderate right lateral basilar pneumothorax remains, approximately 15%. Heart is normal size. No confluent airspace opacities. IMPRESSION: Partial re-expansion of the right lung with placement of pigtail pleural drainage catheter. Approximately 15% right basilar pneumothorax remains. Electronically Signed   By: Charlett NoseKevin  Dover M.D.   On: 02/26/2017 08:49   Dg  Chest Port 1 View  Result Date: 02/26/2017 CLINICAL DATA:  Chest pain and shortness of breath. EXAM: PORTABLE CHEST 1 VIEW COMPARISON:  Single-view of the chest 12/23/2013. FINDINGS: The patient has a very large right pneumothorax with complete collapse the right lung. There is minimal right to left midline shift CROWDING OF THE bronchovascular structures in the left chest is noted. Heart size is normal. IMPRESSION: Large right pneumothorax with mild right to left midline shift. Critical Value/emergent results were called by telephone at the time of interpretation on 02/26/2017 at 7:36 am to Dr. Theda Belfast , who verbally acknowledged these results. Electronically Signed   By: Drusilla Kanner M.D.   On: 02/26/2017 07:37     I have independently reviewed the above radiologic studies.  Recent Lab Findings: Lab Results  Component Value Date   WBC 4.8 02/26/2017   HGB 14.6 02/26/2017     HCT 43.0 02/26/2017   PLT 290 02/26/2017   GLUCOSE 124 (H) 02/26/2017   ALT 19 02/26/2017   AST 30 02/26/2017   NA 142 02/26/2017   K 3.5 02/26/2017   CL 105 02/26/2017   CREATININE 1.10 02/26/2017   BUN 11 02/26/2017   CO2 27 02/26/2017   INR 0.92 02/26/2017      Assessment / Plan:    Initial episode of spontaneous PTX on the right, now with out air leak and chest tube in place.  Does not appear at this point to require VATS for treatment, ie no air leak and initial  Episode of PTX- would recommend  Follow with chest xrays, ensure full inflation , try on water seal , if stable then remove chest tube. If needs further treatment then please call and will need transfer to [facility].      I  spent 30 minutes counseling the patient face to face and 50% or more the  time was spent in counseling and coordination of care. The total time spent in the appointment was 40 minutes.    Delight Ovens MD      [address] [place] [place] [address] Office [phone]   Beeper [phone]  02/26/2017 12:35 PM     Patient ID: Ray [name], male   DOB: November 07, 1961, 56 y.o.   MRN: [id number]

## 2017-02-27 ENCOUNTER — Inpatient Hospital Stay (HOSPITAL_COMMUNITY): Payer: 59

## 2017-02-27 DIAGNOSIS — F17200 Nicotine dependence, unspecified, uncomplicated: Secondary | ICD-10-CM

## 2017-02-27 DIAGNOSIS — R079 Chest pain, unspecified: Secondary | ICD-10-CM

## 2017-02-27 LAB — COMPREHENSIVE METABOLIC PANEL
ALK PHOS: 45 U/L (ref 38–126)
ALT: 15 U/L — AB (ref 17–63)
ANION GAP: 8 (ref 5–15)
AST: 20 U/L (ref 15–41)
Albumin: 3.8 g/dL (ref 3.5–5.0)
BUN: 8 mg/dL (ref 6–20)
CALCIUM: 8.8 mg/dL — AB (ref 8.9–10.3)
CO2: 27 mmol/L (ref 22–32)
CREATININE: 0.9 mg/dL (ref 0.61–1.24)
Chloride: 106 mmol/L (ref 101–111)
Glucose, Bld: 106 mg/dL — ABNORMAL HIGH (ref 65–99)
Potassium: 3.6 mmol/L (ref 3.5–5.1)
SODIUM: 141 mmol/L (ref 135–145)
Total Bilirubin: 0.9 mg/dL (ref 0.3–1.2)
Total Protein: 6.7 g/dL (ref 6.5–8.1)

## 2017-02-27 LAB — CBC
HCT: 42.1 % (ref 39.0–52.0)
HEMOGLOBIN: 14.1 g/dL (ref 13.0–17.0)
MCH: 32.3 pg (ref 26.0–34.0)
MCHC: 33.5 g/dL (ref 30.0–36.0)
MCV: 96.3 fL (ref 78.0–100.0)
PLATELETS: 281 10*3/uL (ref 150–400)
RBC: 4.37 MIL/uL (ref 4.22–5.81)
RDW: 13.2 % (ref 11.5–15.5)
WBC: 6.1 10*3/uL (ref 4.0–10.5)

## 2017-02-27 LAB — ECHOCARDIOGRAM COMPLETE
Height: 72 in
Weight: 2560 oz

## 2017-02-27 MED ORDER — PERFLUTREN LIPID MICROSPHERE
1.0000 mL | INTRAVENOUS | Status: AC | PRN
  Administered 2017-02-27: 3 mL via INTRAVENOUS
  Filled 2017-02-27: qty 10

## 2017-02-27 MED ORDER — PERFLUTREN LIPID MICROSPHERE
INTRAVENOUS | Status: AC
  Filled 2017-02-27: qty 10

## 2017-02-27 NOTE — Progress Notes (Signed)
PROGRESS NOTE [facility]   [place]   RUE:[id number] DOB: [birth year]  DOA: 02/26/2017 PCP: Evlyn Courier, MD   Brief Narrative:  56 y/o old M PMHx of HTN who presented to the ED c/o r sided back pain associated with diaphoresis, lightheadedness, and chest heaviness. Patient was found to have a r side pneumothorax. Patient admitted for chest tube placement.   Subjective: Patient seen and examined with wife at bedside, patient c/o pain at the surgical site. Denies dyspnea.   Assessment & Plan: Spontaneous Pneumothorax - 2/2 to emphysematous blebs S/p pigtail pleural catheter  CXR this AM - full inflation  CT surgery recommendations appreciated  Change CT to water seal and repeat CXR in AM   HTN - stable  Continue home medications   Tobacco abuse  Tobacco cessation discussed   DVT prophylaxis: Lovenox  Code Status: FULL  Family Communication: Wife at bedside  Disposition Plan: Home likely in 48 hrs   Consultants:   CT surgery Dr. Secundino Sage   Procedures:   Pigtail pleural cath   Antimicrobials:  None    Objective: Vitals:   02/26/17 1330 02/26/17 1645 02/26/17 2030 02/27/17 0532  BP: 159/90 (!) 164/90 (!) 147/84 140/69  Pulse: 88 86 69 66  Resp: (!) 36 18 20 20   Temp:  98.6 F (37 C) 98.4 F (36.9 C) 98.6 F (37 C)  TempSrc:  Oral Oral Oral  SpO2: 100% 96% 100% 99%  Weight:      Height:  6' (1.829 m)      Intake/Output Summary (Last 24 hours) at 02/27/17 1316 Last data filed at 02/27/17 0600  Gross per 24 hour  Intake                0 ml  Output               15 ml  Net              -15 ml   Filed Weights   02/26/17 0654  Weight: 72.6 kg (160 lb)    Examination:  General exam: Appears calm and comfortable  Respiratory system: CTA, no wheezing, rales or crackles. Chest tube in place no air leak noted Cardiovascular system: S1 & S2 heard, RRR. No JVD, murmurs, rubs or gallops Gastrointestinal system: Abdomen is nondistended, soft and  nontender.  Central nervous system: Alert and oriented. No focal neurological deficits. Extremities: No pedal edema.  Skin: No rashes, lesions or ulcers Psychiatry: Judgement and insight appear normal. Mood & affect appropriate.    Data Reviewed: I have personally reviewed following labs and imaging studies  CBC:  Recent Labs Lab 02/26/17 0650 02/26/17 0701 02/27/17 0522  WBC 4.8  --  6.1  NEUTROABS 2.3  --   --   HGB 14.3 14.6 14.1  HCT 41.5 43.0 42.1  MCV 95.8  --  96.3  PLT 290  --  281   Basic Metabolic Panel:  Recent Labs Lab 02/26/17 0650 02/26/17 0700 02/26/17 0701 02/27/17 0522  NA 141  --  142 141  K 3.4*  --  3.5 3.6  CL 105  --  105 106  CO2 27  --   --  27  GLUCOSE 123*  --  124* 106*  BUN 12  --  11 8  CREATININE 1.01  --  1.10 0.90  CALCIUM 9.1  --   --  8.8*  MG 2.3 2.1  --   --    GFR: Estimated  Creatinine Clearance: 97.5 mL/min (by C-G formula based on SCr of 0.9 mg/dL). Liver Function Tests:  Recent Labs Lab 02/26/17 0650 02/27/17 0522  AST 30 20  ALT 19 15*  ALKPHOS 47 45  BILITOT 0.5 0.9  PROT 7.7 6.7  ALBUMIN 4.4 3.8   No results for input(s): LIPASE, AMYLASE in the last 168 hours. No results for input(s): AMMONIA in the last 168 hours. Coagulation Profile:  Recent Labs Lab 02/26/17 0650  INR 0.92   Cardiac Enzymes:  Recent Labs Lab 02/26/17 1158  TROPONINI <0.03   BNP (last 3 results) No results for input(s): PROBNP in the last 8760 hours. HbA1C: No results for input(s): HGBA1C in the last 72 hours. CBG: No results for input(s): GLUCAP in the last 168 hours. Lipid Profile: No results for input(s): CHOL, HDL, LDLCALC, TRIG, CHOLHDL, LDLDIRECT in the last 72 hours. Thyroid Function Tests: No results for input(s): TSH, T4TOTAL, FREET4, T3FREE, THYROIDAB in the last 72 hours. Anemia Panel: No results for input(s): VITAMINB12, FOLATE, FERRITIN, TIBC, IRON, RETICCTPCT in the last 72 hours. Sepsis Labs:  Recent  Labs Lab 02/26/17 0702  LATICACIDVEN 1.89    No results found for this or any previous visit (from the past 240 hour(s)).    Radiology Studies: Ct Angio Chest Pe W Or Wo Contrast  Result Date: 02/26/2017 CLINICAL DATA:  Shortness breath. Sudden onset of upper chest pain. Right pneumothorax today. EXAM: CT ANGIOGRAPHY CHEST WITH CONTRAST TECHNIQUE: Multidetector CT imaging of the chest was performed using the standard protocol during bolus administration of intravenous contrast. Multiplanar CT image reconstructions and MIPs were obtained to evaluate the vascular anatomy. CONTRAST:  100 cc Isovue 370 COMPARISON:  Chest x-rays dated 02/26/2017 FINDINGS: Cardiovascular: Satisfactory opacification of the pulmonary arteries to the segmental level. No evidence of pulmonary embolism. Normal heart size. No pericardial effusion. Mediastinum/Nodes: No enlarged mediastinal, hilar, or axillary lymph nodes. Thyroid gland, trachea, and esophagus demonstrate no significant findings. Lungs/Pleura: There is a small anterior basal right pneumothorax. Right-sided chest tube in good position. Multiple small blebs at the lung apices, more on the right than the left. Focal atelectasis and consolidation in the right lower lobe with a small amount of pleural fluid loculated around the right lower lobe. Upper Abdomen: Normal. Musculoskeletal: No chest wall abnormality. No acute or significant osseous findings. Review of the MIP images confirms the above findings. IMPRESSION: 1. No pulmonary emboli. 2. Multiple small emphysematous blebs at both lung apices. 3. Small residual anterior basal right pneumothorax. 4. Residual atelectasis/consolidation in the right lower lobe with a small amount of fluid loculated along the major fissure. Electronically Signed   By: Francene BoyersJames  Maxwell M.D.   On: 02/26/2017 11:26   Dg Chest Portable 2 Views  Result Date: 02/26/2017 CLINICAL DATA:  Right chest tube insertion EXAM: CHEST  2 VIEW PORTABLE  COMPARISON:  02/26/2017 FINDINGS: Interval placement of pleural pigtail chest tube. The locking pigtail loop is partially formed in the medial right hemithorax. Interval partial re-expansion of the right lung. Moderate right lateral basilar pneumothorax remains, approximately 15%. Heart is normal size. No confluent airspace opacities. IMPRESSION: Partial re-expansion of the right lung with placement of pigtail pleural drainage catheter. Approximately 15% right basilar pneumothorax remains. Electronically Signed   By: Charlett NoseKevin  Dover M.D.   On: 02/26/2017 08:49   Dg Chest Port 1 View  Result Date: 02/27/2017 CLINICAL DATA:  Follow-up right-sided pneumothorax EXAM: PORTABLE CHEST 1 VIEW COMPARISON:  Portable chest x-ray of February 26, 2017 FINDINGS:  The lungs are well-expanded. No definite right-sided pneumothorax is observed. The small caliber right-sided chest tube tip overlies the dome of the right hemidiaphragm an appears to been withdrawn slightly since yesterday's study. Persistent patchy density at the right lung base is observed. There is no alveolar infiltrate elsewhere or pleural effusion. There is no mediastinal shift. The heart and pulmonary vascularity are normal. The bony thorax exhibits no acute abnormality. IMPRESSION: No residual right-sided pneumothorax. The small caliber chest tube tip overlies the dome of the hemidiaphragm. Persistent subsegmental atelectasis at the right lung base. Electronically Signed   By: David  [place]az[place]and M.D.   On: 02/27/2017 08:53   Dg Chest Port 1 View  Result Date: 02/26/2017 CLINICAL DATA:  Follow up pneumothorax, chest tube. EXAM: PORTABLE CHEST 1 VIEW COMPARISON:  Chest x-rays dated 02/26/2017 and 12/23/2013. FINDINGS: Right-sided chest tube is now positioned at the lung base. There is now no residual pneumothorax. Probable atelectasis and small pleural effusion at the right lung base. Coarse interstitial markings bilaterally suggesting interstitial edema. Heart size and  mediastinal contours are normal. Osseous structures about the chest are unremarkable. IMPRESSION: Chest tube now at the right lung base. No residual pneumothorax. Probable bilateral interstitial edema. Probable atelectasis and small effusion at the right lung base. Electronically Signed   By: Bary Richard M.D.   On: 02/26/2017 13:38   Dg Chest Port 1 View  Result Date: 02/26/2017 CLINICAL DATA:  Chest pain and shortness of breath. EXAM: PORTABLE CHEST 1 VIEW COMPARISON:  Single-view of the chest 12/23/2013. FINDINGS: The patient has a very large right pneumothorax with complete collapse the right lung. There is minimal right to left midline shift CROWDING OF THE bronchovascular structures in the left chest is noted. Heart size is normal. IMPRESSION: Large right pneumothorax with mild right to left midline shift. Critical Value/emergent results were called by telephone at the time of interpretation on 02/26/2017 at 7:36 am to Dr. Theda Belfast , who verbally acknowledged these results. Electronically Signed   By: Drusilla Kanner M.D.   On: 02/26/2017 07:37    Scheduled Meds: . enoxaparin (LOVENOX) injection  40 mg Subcutaneous Q24H  . losartan  100 mg Oral Daily  . sodium chloride flush  3 mL Intravenous Q12H   Continuous Infusions:   LOS: 1 day    Latrelle Dodrill, MD Pager: Text Page via [email or web address]  [phone]  If 7PM-7AM, please contact night-coverage [email or web address] Password [email or web address] 02/27/2017, 1:16 PM

## 2017-02-27 NOTE — Progress Notes (Signed)
  Echocardiogram 2D Echocardiogram with definity has been performed.  Leta JunglingCooper, Lisl Slingerland M 02/27/2017, 10:50 AM

## 2017-02-27 NOTE — Care Management Note (Signed)
Case Management Note  Patient Details  Name: Ray [name] MRN: [id number] Date of Birth: 05/10/1961  Subjective/Objective:55 y.o. [name] admitted with Spontaneous Pneumothorax. Anticipate CT to be removed today.Home with spouse.                     Action/Plan: Anticipate discharge home 02/28/2017. No further CM needs but will be available should additional discharge needs arise.   Expected Discharge Date:   (unknown)               Expected Discharge Plan:  Home/Self Care  In-House Referral:  NA  Discharge planning Services  CM Consult  Post Acute Care Choice:  NA Choice offered to:  Patient, Spouse  DME Arranged:  N/A DME Agency:  NA  HH Arranged:  NA HH Agency:  NA  Status of Service:  Completed, signed off  If discussed at Long Length of Stay Meetings, dates discussed:    Additional Comments:  Ray [name], Ray Leitzel M, RN 02/27/2017, 10:04 AM

## 2017-02-27 NOTE — Progress Notes (Signed)
    Xray reviewed this am- appears full inflation Suggest placing ct to water seal today if no air leak. Delight OvensEdward B Juquan Reznick MD      [address] [place] [id number] Office [phone]   Beeper [phone]

## 2017-02-28 ENCOUNTER — Inpatient Hospital Stay (HOSPITAL_COMMUNITY): Payer: 59

## 2017-02-28 DIAGNOSIS — F17209 Nicotine dependence, unspecified, with unspecified nicotine-induced disorders: Secondary | ICD-10-CM

## 2017-02-28 MED ORDER — MORPHINE SULFATE (PF) 4 MG/ML IV SOLN
1.0000 mg | Freq: Once | INTRAVENOUS | Status: AC
  Administered 2017-02-28: 1 mg via INTRAVENOUS
  Filled 2017-02-28: qty 1

## 2017-02-28 NOTE — Progress Notes (Signed)
Patient ID: Ray [name], male   DOB: 28-Jul-1961, 56 y.o.   MRN: [id number] Chest xray reviewed If not air leak on water seal, consider remove of chest tube Will need follow up pa and lat chest xray before d/c  Delight OvensEdward B Latayna Ritchie MD      [address] [place] [id number] Office [phone]   Beeper [phone]

## 2017-02-28 NOTE — Progress Notes (Signed)
PROGRESS NOTE [facility]   [place]   ZOX:[id number] DOB: 1961/08/02  DOA: 02/26/2017 PCP: Evlyn Courier, MD   Brief Narrative:  56 y/o old M PMHx of HTN who presented to the ED c/o r sided back pain associated with diaphoresis, lightheadedness, and chest heaviness. Patient was found to have a r side pneumothorax. Patient admitted for chest tube placement.   Subjective: Doing well, today pain at the surgical site well controlled. No acute events overnight. Denies SOB. No air leak noted on water seal    Assessment & Plan: Spontaneous Pneumothorax - 2/2 to emphysematous blebs S/p pigtail pleural catheter  CXR this AM - continues to be reassuring  CT surgery recommendations appreciated  Will remove chest tube today. Repeat CXR PA and Lateral tomorrow   HTN - stable  Continue home medications   Tobacco abuse  Tobacco cessation discussed   DVT prophylaxis: Lovenox  Code Status: FULL  Family Communication: Wife at bedside  Disposition Plan: Home in AM if CXR stable   Consultants:   CT surgery Dr. Hakim Sage   Procedures:   Pigtail pleural cath   Antimicrobials:  None    Objective: Vitals:   02/27/17 1323 02/27/17 2002 02/28/17 0435 02/28/17 1346  BP: 134/75 103/61 132/81 125/66  Pulse: 75 96 65 73  Resp: 20 20 18 16   Temp: 98.7 F (37.1 C) 97.9 F (36.6 C) 98.4 F (36.9 C) 99 F (37.2 C)  TempSrc: Oral Oral Oral Oral  SpO2: 100% 100% 100% 99%  Weight:      Height:        Intake/Output Summary (Last 24 hours) at 02/28/17 1536 Last data filed at 02/28/17 1248  Gross per 24 hour  Intake                0 ml  Output              254 ml  Net             -254 ml   Filed Weights   02/26/17 0654  Weight: 72.6 kg (160 lb)    Examination:  General exam: NAD  Respiratory system: Clear, Chest tube no air leak noted  Cardiovascular system: S1S2 no murmurs  Gastrointestinal system: Soft NTND  Central nervous system: Non focal  Extremities: No LE  edema   Skin: No rash  Psychiatry: Mood appropriate     Data Reviewed: I have personally reviewed following labs and imaging studies  CBC:  Recent Labs Lab 02/26/17 0650 02/26/17 0701 02/27/17 0522  WBC 4.8  --  6.1  NEUTROABS 2.3  --   --   HGB 14.3 14.6 14.1  HCT 41.5 43.0 42.1  MCV 95.8  --  96.3  PLT 290  --  281   Basic Metabolic Panel:  Recent Labs Lab 02/26/17 0650 02/26/17 0700 02/26/17 0701 02/27/17 0522  NA 141  --  142 141  K 3.4*  --  3.5 3.6  CL 105  --  105 106  CO2 27  --   --  27  GLUCOSE 123*  --  124* 106*  BUN 12  --  11 8  CREATININE 1.01  --  1.10 0.90  CALCIUM 9.1  --   --  8.8*  MG 2.3 2.1  --   --    GFR: Estimated Creatinine Clearance: 97.5 mL/min (by C-G formula based on SCr of 0.9 mg/dL). Liver Function Tests:  Recent Labs Lab 02/26/17 0650 02/27/17 0522  AST 30 20  ALT 19 15*  ALKPHOS 47 45  BILITOT 0.5 0.9  PROT 7.7 6.7  ALBUMIN 4.4 3.8   No results for input(s): LIPASE, AMYLASE in the last 168 hours. No results for input(s): AMMONIA in the last 168 hours. Coagulation Profile:  Recent Labs Lab 02/26/17 0650  INR 0.92   Cardiac Enzymes:  Recent Labs Lab 02/26/17 1158  TROPONINI <0.03   BNP (last 3 results) No results for input(s): PROBNP in the last 8760 hours. HbA1C: No results for input(s): HGBA1C in the last 72 hours. CBG: No results for input(s): GLUCAP in the last 168 hours. Lipid Profile: No results for input(s): CHOL, HDL, LDLCALC, TRIG, CHOLHDL, LDLDIRECT in the last 72 hours. Thyroid Function Tests: No results for input(s): TSH, T4TOTAL, FREET4, T3FREE, THYROIDAB in the last 72 hours. Anemia Panel: No results for input(s): VITAMINB12, FOLATE, FERRITIN, TIBC, IRON, RETICCTPCT in the last 72 hours. Sepsis Labs:  Recent Labs Lab 02/26/17 0702  LATICACIDVEN 1.89    No results found for this or any previous visit (from the past 240 hour(s)).    Radiology Studies: Dg Chest Port 1  View  Result Date: 02/28/2017 CLINICAL DATA:  Follow-up pneumothorax EXAM: PORTABLE CHEST 1 VIEW COMPARISON:  02/27/2017 FINDINGS: Right chest tube is again identified and stable. No residual or recurrent pneumothorax is seen. The left lung remains clear. No cardiac abnormality is noted. IMPRESSION: No evidence of pneumothorax. Electronically Signed   By: Alcide CleverMark  Lukens M.D.   On: 02/28/2017 07:10   Dg Chest Port 1 View  Result Date: 02/27/2017 CLINICAL DATA:  Follow-up right-sided pneumothorax EXAM: PORTABLE CHEST 1 VIEW COMPARISON:  Portable chest x-ray of February 26, 2017 FINDINGS: The lungs are well-expanded. No definite right-sided pneumothorax is observed. The small caliber right-sided chest tube tip overlies the dome of the right hemidiaphragm an appears to been withdrawn slightly since yesterday's study. Persistent patchy density at the right lung base is observed. There is no alveolar infiltrate elsewhere or pleural effusion. There is no mediastinal shift. The heart and pulmonary vascularity are normal. The bony thorax exhibits no acute abnormality. IMPRESSION: No residual right-sided pneumothorax. The small caliber chest tube tip overlies the dome of the hemidiaphragm. Persistent subsegmental atelectasis at the right lung base. Electronically Signed   By: David  SwazilandJordan M.D.   On: 02/27/2017 08:53    Scheduled Meds: . enoxaparin (LOVENOX) injection  40 mg Subcutaneous Q24H  . losartan  100 mg Oral Daily  .  morphine injection  1 mg Intravenous Once  . sodium chloride flush  3 mL Intravenous Q12H   Continuous Infusions:   LOS: 2 days    Latrelle DodrillEdwin Silva, MD Pager: Text Page via [email or web address]  [phone]  If 7PM-7AM, please contact night-coverage [email or web address] Password [email or web address] 02/28/2017, 3:36 PM

## 2017-03-01 ENCOUNTER — Inpatient Hospital Stay (HOSPITAL_COMMUNITY): Payer: 59

## 2017-03-01 DIAGNOSIS — J439 Emphysema, unspecified: Secondary | ICD-10-CM

## 2017-03-01 MED ORDER — TRAMADOL HCL 50 MG PO TABS: 50.0000 mg | ORAL_TABLET | Freq: Four times a day (QID) | ORAL | 0 refills | Status: DC | PRN

## 2017-03-01 NOTE — Discharge Summary (Signed)
Physician Discharge Summary  Ray [name]  JYN:[id number]  DOB: 1961-03-08  DOA: 02/26/2017 PCP: Evlyn Courier, MD  Admit date: 02/26/2017 Discharge date: 03/01/2017  Admitted From: Home   Disposition:  Home   Recommendations for Outpatient Follow-up:  1. Follow up with PCP in 4-5 days 2. Please obtain BMP/CBC in one week to monitor hgb and renal function 3. Please remove sutures from chest wall in 4-5 days   Discharge Condition:Stable  CODE STATUS: FULL  Diet recommendation: Heart Healthy  Brief/Interim Summary: 56 y/o old M PMHx of HTN who presented to the ED c/o r sided back pain associated with diaphoresis, lightheadedness, and chest heaviness. Patient was found to have a r side pneumothorax. Patient admitted for chest tube placement. Pneumothorax has resolved. Chest tube was removed and patient continue to stay well. Patient will be discharge home to follow up with PCP   Subjective: Patient seen and examined, have no complaints this Am. Denies chest pain, SOB and palpitations. No acute events overnight.    Discharge Diagnoses/Hospital Course:  Spontaneous Pneumothorax - 2/2 to emphysematous blebs S/p pigtail pleural catheter 02/26/17, removed on 02/28/17 Multiple CXR series reassuring no signs of pneumothorax  Will prescribe Tramadol for pain as needed.  Follow up with PMD in 4-5 days for suture removal  Advised to avoid contact physical sports No smoking  HTN - stable  Continue home medications   Tobacco abuse - occasional cigars Tobacco cessation discussed  Emphysema - long term smoker  Follow up with PCP  May need PFT at some point   All other chronic medical condition were stable during the hospitalization.  On the day of the discharge the patient's vitals were stable, and no other acute medical condition were reported by patient. Patient was felt safe to be discharge to home   Discharge Instructions  You were cared for by a hospitalist during your hospital stay.  If you have any questions about your discharge medications or the care you received while you were in the hospital after you are discharged, you can call the unit and asked to speak with the hospitalist on call if the hospitalist that took care of you is not available. Once you are discharged, your primary care physician will handle any further medical issues. Please note that NO REFILLS for any discharge medications will be authorized once you are discharged, as it is imperative that you return to your primary care physician (or establish a relationship with a primary care physician if you do not have one) for your aftercare needs so that they can reassess your need for medications and monitor your lab values.  Discharge Instructions    Call MD for:  difficulty breathing, headache or visual disturbances    Complete by:  As directed    Call MD for:  extreme fatigue    Complete by:  As directed    Call MD for:  hives    Complete by:  As directed    Call MD for:  persistant dizziness or light-headedness    Complete by:  As directed    Call MD for:  persistant nausea and vomiting    Complete by:  As directed    Call MD for:  redness, tenderness, or signs of infection (pain, swelling, redness, odor or green/yellow discharge around incision site)    Complete by:  As directed    Call MD for:  severe uncontrolled pain    Complete by:  As directed    Call MD  for:  temperature >100.4    Complete by:  As directed    Diet - low sodium heart healthy    Complete by:  As directed    Increase activity slowly    Complete by:  As directed      Allergies as of 03/01/2017      Reactions   Asa [aspirin] Nausea Only      Medication List    TAKE these medications   bisoprolol-hydrochlorothiazide 10-6.25 MG tablet Commonly known as:  ZIAC Take 1 tablet by mouth daily. What changed:  when to take this   losartan 100 MG tablet Commonly known as:  COZAAR Take 100 mg by mouth every morning.    MULTIVITAMIN MEN 50+ PO Take 1 tablet by mouth daily.   traMADol 50 MG tablet Commonly known as:  ULTRAM Take 1 tablet (50 mg total) by mouth every 6 (six) hours as needed.   traZODone 50 MG tablet Commonly known as:  DESYREL Take 50 mg by mouth at bedtime as needed for sleep.      Follow-up Information    Evlyn Courier, MD. Schedule an appointment as soon as possible for a visit in 1 week(s).   Specialty:  Family Medicine Contact information: [address] [place] [address] [phone]          Allergies  Allergen Reactions  . Asa [Aspirin] Nausea Only    Consultations:  CTS   Procedures/Studies: Dg Chest 2 View  Result Date: 03/01/2017 CLINICAL DATA:  56 year old male with history of right-sided pneumothorax status post chest tube removal yesterday. EXAM: CHEST  2 VIEW COMPARISON:  Multiple priors, most recently 02/28/2017. FINDINGS: Previously noted small bore right-sided chest tube has been removed. No definite pneumothorax identified. Trace bilateral pleural effusions. No acute consolidative airspace disease. Mild emphysematous changes are again noted in the apices of the lungs bilaterally. No suspicious appearing pulmonary nodules or masses. No evidence of pulmonary edema. Heart size is normal. Upper mediastinal contours are within normal limits. IMPRESSION: 1. Status post right-sided chest tube removal. No evidence of residual/recurrent pneumothorax. 2. Trace bilateral pleural effusions. 3. Emphysema. Electronically Signed   By: Trudie Reed M.D.   On: 03/01/2017 09:18   Ct Angio Chest Pe W Or Wo Contrast  Result Date: 02/26/2017 CLINICAL DATA:  Shortness breath. Sudden onset of upper chest pain. Right pneumothorax today. EXAM: CT ANGIOGRAPHY CHEST WITH CONTRAST TECHNIQUE: Multidetector CT imaging of the chest was performed using the standard protocol during bolus administration of intravenous contrast. Multiplanar CT image reconstructions and MIPs were  obtained to evaluate the vascular anatomy. CONTRAST:  100 cc Isovue 370 COMPARISON:  Chest x-rays dated 02/26/2017 FINDINGS: Cardiovascular: Satisfactory opacification of the pulmonary arteries to the segmental level. No evidence of pulmonary embolism. Normal heart size. No pericardial effusion. Mediastinum/Nodes: No enlarged mediastinal, hilar, or axillary lymph nodes. Thyroid gland, trachea, and esophagus demonstrate no significant findings. Lungs/Pleura: There is a small anterior basal right pneumothorax. Right-sided chest tube in good position. Multiple small blebs at the lung apices, more on the right than the left. Focal atelectasis and consolidation in the right lower lobe with a small amount of pleural fluid loculated around the right lower lobe. Upper Abdomen: Normal. Musculoskeletal: No chest wall abnormality. No acute or significant osseous findings. Review of the MIP images confirms the above findings. IMPRESSION: 1. No pulmonary emboli. 2. Multiple small emphysematous blebs at both lung apices. 3. Small residual anterior basal right pneumothorax. 4. Residual atelectasis/consolidation  in the right lower lobe with a small amount of fluid loculated along the major fissure. Electronically Signed   By: Francene Boyers M.D.   On: 02/26/2017 11:26   Dg Chest Portable 2 Views  Result Date: 02/26/2017 CLINICAL DATA:  Right chest tube insertion EXAM: CHEST  2 VIEW PORTABLE COMPARISON:  02/26/2017 FINDINGS: Interval placement of pleural pigtail chest tube. The locking pigtail loop is partially formed in the medial right hemithorax. Interval partial re-expansion of the right lung. Moderate right lateral basilar pneumothorax remains, approximately 15%. Heart is normal size. No confluent airspace opacities. IMPRESSION: Partial re-expansion of the right lung with placement of pigtail pleural drainage catheter. Approximately 15% right basilar pneumothorax remains. Electronically Signed   By: Charlett Nose M.D.   On:  02/26/2017 08:49   Dg Chest Port 1 View  Result Date: 02/28/2017 CLINICAL DATA:  Follow-up pneumothorax EXAM: PORTABLE CHEST 1 VIEW COMPARISON:  02/27/2017 FINDINGS: Right chest tube is again identified and stable. No residual or recurrent pneumothorax is seen. The left lung remains clear. No cardiac abnormality is noted. IMPRESSION: No evidence of pneumothorax. Electronically Signed   By: Alcide Clever M.D.   On: 02/28/2017 07:10   Dg Chest Port 1 View  Result Date: 02/27/2017 CLINICAL DATA:  Follow-up right-sided pneumothorax EXAM: PORTABLE CHEST 1 VIEW COMPARISON:  Portable chest x-ray of February 26, 2017 FINDINGS: The lungs are well-expanded. No definite right-sided pneumothorax is observed. The small caliber right-sided chest tube tip overlies the dome of the right hemidiaphragm an appears to been withdrawn slightly since yesterday's study. Persistent patchy density at the right lung base is observed. There is no alveolar infiltrate elsewhere or pleural effusion. There is no mediastinal shift. The heart and pulmonary vascularity are normal. The bony thorax exhibits no acute abnormality. IMPRESSION: No residual right-sided pneumothorax. The small caliber chest tube tip overlies the dome of the hemidiaphragm. Persistent subsegmental atelectasis at the right lung base. Electronically Signed   By: David  [place] M.D.   On: 02/27/2017 08:53   Dg Chest Port 1 View  Result Date: 02/26/2017 CLINICAL DATA:  Follow up pneumothorax, chest tube. EXAM: PORTABLE CHEST 1 VIEW COMPARISON:  Chest x-rays dated 02/26/2017 and 12/23/2013. FINDINGS: Right-sided chest tube is now positioned at the lung base. There is now no residual pneumothorax. Probable atelectasis and small pleural effusion at the right lung base. Coarse interstitial markings bilaterally suggesting interstitial edema. Heart size and mediastinal contours are normal. Osseous structures about the chest are unremarkable. IMPRESSION: Chest tube now at the right  lung base. No residual pneumothorax. Probable bilateral interstitial edema. Probable atelectasis and small effusion at the right lung base. Electronically Signed   By: Bary Richard M.D.   On: 02/26/2017 13:38   Dg Chest Port 1 View  Result Date: 02/26/2017 CLINICAL DATA:  Chest pain and shortness of breath. EXAM: PORTABLE CHEST 1 VIEW COMPARISON:  Single-view of the chest 12/23/2013. FINDINGS: The patient has a very large right pneumothorax with complete collapse the right lung. There is minimal right to left midline shift CROWDING OF THE bronchovascular structures in the left chest is noted. Heart size is normal. IMPRESSION: Large right pneumothorax with mild right to left midline shift. Critical Value/emergent results were called by telephone at the time of interpretation on 02/26/2017 at 7:36 am to Dr. Theda Belfast , who verbally acknowledged these results. Electronically Signed   By: Drusilla Kanner M.D.   On: 02/26/2017 07:37   ECHO 02/27/17 ------------------------------------------------------------------- Study Conclusions  - Left  ventricle: The cavity size was normal. Systolic function was   normal. The estimated ejection fraction was in the range of 55%   to 60%. Wall motion was normal; there were no regional wall   motion abnormalities. Left ventricular diastolic function   parameters were normal. - Atrial septum: No defect or patent foramen ovale was identified.  Discharge Exam: Vitals:   02/28/17 2101 03/01/17 0452  BP: (!) 144/80 (!) 145/89  Pulse: 75 77  Resp: 18 18  Temp: 99.6 F (37.6 C) 98.5 F (36.9 C)   Vitals:   02/28/17 0435 02/28/17 1346 02/28/17 2101 03/01/17 0452  BP: 132/81 125/66 (!) 144/80 (!) 145/89  Pulse: 65 73 75 77  Resp: 18 16 18 18   Temp: 98.4 F (36.9 C) 99 F (37.2 C) 99.6 F (37.6 C) 98.5 F (36.9 C)  TempSrc: Oral Oral Oral Oral  SpO2: 100% 99% 100% 100%  Weight:      Height:        General: Pt is alert, awake, not in acute  distress Cardiovascular: RRR, S1/S2 +, no rubs, no gallops Respiratory: CTA bilaterally, no wheezing, no rhonchi, dressing in place.  Abdominal: Soft, NT, ND, bowel sounds + Extremities: no edema, no cyanosis   The results of significant diagnostics from this hospitalization (including imaging, microbiology, ancillary and laboratory) are listed below for reference.     Microbiology: No results found for this or any previous visit (from the past 240 hour(s)).   Labs: BNP (last 3 results) No results for input(s): BNP in the last 8760 hours. Basic Metabolic Panel:  Recent Labs Lab 02/26/17 0650 02/26/17 0700 02/26/17 0701 02/27/17 0522  NA 141  --  142 141  K 3.4*  --  3.5 3.6  CL 105  --  105 106  CO2 27  --   --  27  GLUCOSE 123*  --  124* 106*  BUN 12  --  11 8  CREATININE 1.01  --  1.10 0.90  CALCIUM 9.1  --   --  8.8*  MG 2.3 2.1  --   --    Liver Function Tests:  Recent Labs Lab 02/26/17 0650 02/27/17 0522  AST 30 20  ALT 19 15*  ALKPHOS 47 45  BILITOT 0.5 0.9  PROT 7.7 6.7  ALBUMIN 4.4 3.8   No results for input(s): LIPASE, AMYLASE in the last 168 hours. No results for input(s): AMMONIA in the last 168 hours. CBC:  Recent Labs Lab 02/26/17 0650 02/26/17 0701 02/27/17 0522  WBC 4.8  --  6.1  NEUTROABS 2.3  --   --   HGB 14.3 14.6 14.1  HCT 41.5 43.0 42.1  MCV 95.8  --  96.3  PLT 290  --  281   Cardiac Enzymes:  Recent Labs Lab 02/26/17 1158  TROPONINI <0.03   BNP: Invalid input(s): POCBNP CBG: No results for input(s): GLUCAP in the last 168 hours. D-Dimer No results for input(s): DDIMER in the last 72 hours. Hgb A1c No results for input(s): HGBA1C in the last 72 hours. Lipid Profile No results for input(s): CHOL, HDL, LDLCALC, TRIG, CHOLHDL, LDLDIRECT in the last 72 hours. Thyroid function studies No results for input(s): TSH, T4TOTAL, T3FREE, THYROIDAB in the last 72 hours.  Invalid input(s): FREET3 Anemia work up No results for  input(s): VITAMINB12, FOLATE, FERRITIN, TIBC, IRON, RETICCTPCT in the last 72 hours. Urinalysis    Component Value Date/Time   COLORURINE YELLOW 12/23/2013 1646   APPEARANCEUR CLEAR 12/23/2013  1646   LABSPEC 1.029 12/23/2013 1646   PHURINE 5.5 12/23/2013 1646   GLUCOSEU NEGATIVE 12/23/2013 1646   HGBUR TRACE (A) 12/23/2013 1646   BILIRUBINUR NEGATIVE 12/23/2013 1646   KETONESUR >80 (A) 12/23/2013 1646   PROTEINUR 100 (A) 12/23/2013 1646   UROBILINOGEN 0.2 12/23/2013 1646   NITRITE NEGATIVE 12/23/2013 1646   LEUKOCYTESUR NEGATIVE 12/23/2013 1646   Sepsis Labs Invalid input(s): PROCALCITONIN,  WBC,  LACTICIDVEN Microbiology No results found for this or any previous visit (from the past 240 hour(s)).   Time coordinating discharge: 35 minutes  SIGNED:  Latrelle DodrillEdwin Silva, MD  [facility] 03/01/2017, 11:56 AM  Pager please text page via  [email or web address] Password [email or web address]

## 2017-09-24 ENCOUNTER — Encounter (HOSPITAL_COMMUNITY): Payer: Self-pay | Admitting: Emergency Medicine

## 2017-09-24 ENCOUNTER — Emergency Department (HOSPITAL_COMMUNITY): Payer: Self-pay

## 2017-09-24 ENCOUNTER — Emergency Department (HOSPITAL_COMMUNITY)
Admission: EM | Admit: 2017-09-24 | Discharge: 2017-09-24 | Disposition: A | Discharge status: Home / Self Care | Payer: Self-pay | Attending: Emergency Medicine | Admitting: Emergency Medicine

## 2017-09-24 DIAGNOSIS — R0789 Other chest pain: Secondary | ICD-10-CM | POA: Insufficient documentation

## 2017-09-24 DIAGNOSIS — F17200 Nicotine dependence, unspecified, uncomplicated: Secondary | ICD-10-CM | POA: Insufficient documentation

## 2017-09-24 DIAGNOSIS — Z79899 Other long term (current) drug therapy: Secondary | ICD-10-CM | POA: Insufficient documentation

## 2017-09-24 DIAGNOSIS — I10 Essential (primary) hypertension: Secondary | ICD-10-CM | POA: Insufficient documentation

## 2017-09-24 DIAGNOSIS — R0602 Shortness of breath: Secondary | ICD-10-CM | POA: Insufficient documentation

## 2017-09-24 HISTORY — DX: Atelectasis: J98.11

## 2017-09-24 LAB — BASIC METABOLIC PANEL
ANION GAP: 9 (ref 5–15)
BUN: 13 mg/dL (ref 6–20)
CHLORIDE: 104 mmol/L (ref 101–111)
CO2: 29 mmol/L (ref 22–32)
CREATININE: 1.18 mg/dL (ref 0.61–1.24)
Calcium: 9.3 mg/dL (ref 8.9–10.3)
Glucose, Bld: 114 mg/dL — ABNORMAL HIGH (ref 65–99)
POTASSIUM: 4.1 mmol/L (ref 3.5–5.1)
Sodium: 142 mmol/L (ref 135–145)

## 2017-09-24 LAB — CBC
HCT: 38.1 % — ABNORMAL LOW (ref 39.0–52.0)
HEMOGLOBIN: 12.5 g/dL — AB (ref 13.0–17.0)
MCH: 31.3 pg (ref 26.0–34.0)
MCHC: 32.8 g/dL (ref 30.0–36.0)
MCV: 95.5 fL (ref 78.0–100.0)
Platelets: 260 10*3/uL (ref 150–400)
RBC: 3.99 MIL/uL — AB (ref 4.22–5.81)
RDW: 13.4 % (ref 11.5–15.5)
WBC: 4.9 10*3/uL (ref 4.0–10.5)

## 2017-09-24 LAB — D-DIMER, QUANTITATIVE (NOT AT ARMC): D DIMER QUANT: 0.32 ug{FEU}/mL (ref 0.00–0.50)

## 2017-09-24 NOTE — Discharge Instructions (Signed)
You may use Tylenol or ibuprofen as needed for pain. Follow-up with your primary care doctor if your symptoms are not improving. Return to the emergency room immediately if you develop worsening pain, difficulty breathing, shortness of breath, or any new or worsening symptoms.

## 2017-09-24 NOTE — ED Triage Notes (Signed)
Pt reports shortness of breath and and right side chest pain, radiating from r/shoulder to r/lower chest. Denies mid-to left chest wall pain. Pt reports hx r/side atelectasis 6 months ago. Pt is alert, oriented and appropriate. Wife at bedside

## 2017-09-24 NOTE — ED Provider Notes (Signed)
[facility]-EMERGENCY DEPT Provider Note   CSN: [id number] Arrival date & time: 09/24/17  1403     History   Chief Complaint Chief Complaint  Patient presents with  . Shortness of Breath  . Chest Pain    HPI Ray [name] is a 56 y.o. male presenting with right-sided chest pain.  Patient states he was at work this morning moving a light box when he had acute onset right-sided scapular pain. The pain persisted and is radiating to the front. He reports associated shortness of breath. The pain did not improve with rest. Pain is worse with deep inspiration, is not worsened by movement of the arm. He denies pain of the left side. He reports symptoms feel similar, although less severe, as they did in March when he was diagnosed with a pneumothorax. Currently, he denies pain or shortness of breath. He denies recent fever, chills, cough, congestion, nausea, vomiting, or abdominal pain. He denies numbness or tingling. He has not taken anything for pain.   HPI  Past Medical History:  Diagnosis Date  . Atelectasis    r/side  . Hypertension     Patient Active Problem List   Diagnosis Date Noted  . Pneumothorax 02/26/2017  . Hypertension 02/26/2017  . Nicotine dependence 02/26/2017  . Spontaneous pneumothorax 02/26/2017    History reviewed. No pertinent surgical history.     Home Medications    Prior to Admission medications   Medication Sig Start Date End Date Taking? Authorizing Provider  bisoprolol-hydrochlorothiazide (ZIAC) 10-6.25 MG per tablet Take 1 tablet by mouth daily. 12/23/13  Yes Nelva Nay, MD  losartan (COZAAR) 100 MG tablet Take 100 mg by mouth every morning. 02/16/17  Yes [provider]  traZODone (DESYREL) 50 MG tablet Take 50 mg by mouth at bedtime as needed for sleep.  01/29/17  Yes [provider]  traMADol (ULTRAM) 50 MG tablet Take 1 tablet (50 mg total) by mouth every 6 (six) hours as needed. Patient not taking: Reported on 09/24/2017 03/01/17    Lenox Ponds, MD    Family History Family History  Problem Relation Age of Onset  . Hypertension Mother     Social History Social History  Substance Use Topics  . Smoking status: Current Every Day Smoker  . Smokeless tobacco: Never Used  . Alcohol use Yes     Comment: pt reports 1 beer daily     Allergies   Asa [aspirin]   Review of Systems Review of Systems  Constitutional: Negative for chills and fever.  HENT: Negative for congestion and sore throat.   Respiratory: Positive for shortness of breath. Negative for cough and chest tightness.   Cardiovascular: Positive for chest pain. Negative for palpitations and leg swelling.  Gastrointestinal: Negative for abdominal pain, nausea and vomiting.  Musculoskeletal: Negative for neck pain.  Skin: Negative for wound.  Allergic/Immunologic: Negative for immunocompromised state.  Neurological: Negative for weakness and headaches.  Hematological: Does not bruise/bleed easily.  Psychiatric/Behavioral: Negative for agitation and confusion.     Physical Exam Updated Vital Signs BP (!) 149/77   Pulse 87   Temp 98.3 F (36.8 C) (Oral)   Resp 18   Wt 70.3 kg (155 lb)   SpO2 100%   BMI 21.02 kg/m   Physical Exam  Constitutional: He is oriented to person, place, and time. He appears well-developed and well-nourished. No distress.  HENT:  Head: Normocephalic and atraumatic.  Mouth/Throat: Uvula is midline, oropharynx is clear and moist and mucous membranes are  normal.  Eyes: Pupils are equal, round, and reactive to light. Conjunctivae and EOM are normal.  Neck: Normal range of motion.  No tracheal deviation  Cardiovascular: Normal rate, regular rhythm and intact distal pulses.   Pulmonary/Chest: Effort normal and breath sounds normal. No respiratory distress. He has no decreased breath sounds. He has no wheezes. He has no rhonchi. He has no rales.  Slight pain with deep inspiration. No obvious distress. Patient  speaking in full sentences without difficulty. Good lung sounds in all fields.  Abdominal: Soft. He exhibits no distension. There is no tenderness. There is no guarding.  Musculoskeletal: Normal range of motion.  Moves all extremities without difficulty. No pain with movement of her arms. Radial pedal pulses intact bilaterally. No leg pain or swelling.  Lymphadenopathy:    He has no cervical adenopathy.  Neurological: He is alert and oriented to person, place, and time.  Skin: Skin is warm and dry.  Psychiatric: He has a normal mood and affect.  Nursing note and vitals reviewed.    ED Treatments / Results  Labs (all labs ordered are listed, but only abnormal results are displayed) Labs Reviewed  CBC - Abnormal; Notable for the following:       Result Value   RBC 3.99 (*)    Hemoglobin 12.5 (*)    HCT 38.1 (*)    All other components within normal limits  BASIC METABOLIC PANEL - Abnormal; Notable for the following:    Glucose, Bld 114 (*)    All other components within normal limits  D-DIMER, QUANTITATIVE (NOT AT [facility])    EKG  EKG Interpretation None       Radiology Dg Chest 2 View  Result Date: 09/24/2017 CLINICAL DATA:  Shortness of breath and right-sided chest pain EXAM: CHEST  2 VIEW COMPARISON:  Chest radiograph 09/24/2017 FINDINGS: The heart size and mediastinal contours are within normal limits. Both lungs are clear. The visualized skeletal structures are unremarkable. IMPRESSION: No active cardiopulmonary disease. Electronically Signed   By: Deatra Robinson M.D.   On: 09/24/2017 17:57   Dg Chest 2 View  Result Date: 09/24/2017 CLINICAL DATA:  Right-sided chest pain, some shortness of breath, former smoking history EXAM: CHEST  2 VIEW COMPARISON:  Chest x-ray of 03/01/2017 FINDINGS: No active infiltrate or effusion is seen. There are more prominent perihilar markings better seen on the lateral view which could indicate bronchitis with some peribronchial thickening. No  definite adenopathy is noted. Mediastinal and hilar contours are unremarkable. The heart is within normal limits in size. No bony abnormality is seen. IMPRESSION: Slightly more prominent perihilar markings may indicate bronchitis. No pneumonia or effusion is seen. Electronically Signed   By: Dwyane Dee M.D.   On: 09/24/2017 15:30    Procedures Procedures (including critical care time)  Medications Ordered in ED Medications - No data to display   Initial Impression / Assessment and Plan / ED Course  I have reviewed the triage vital signs and the nursing notes.  Pertinent labs & imaging results that were available during my care of the patient were reviewed by me and considered in my medical decision making (see chart for details).     Patient presenting with right-sided scapular pain radiating to the front. Associated shortness of breath. Symptoms resolved currently. She is concerned as he has recent history of spontaneous pneumothorax and states symptoms feel similar. Will order basic labs, d-dimer, and chest x-ray for evaluation.  Physical labs reassuring. D-dimer negative. Chest x-ray  negative for obvious pneumothorax, discussed with Dr. Criss Alvine and will order repeat x-ray in a few hours to ensure no change.  Repeat x-ray shows no change. Likely muscular pain. Discussed findings with patient. At this time, patient appears safe for discharge. Strict return precautions given. Patient states he understands and agrees to plan.   Final Clinical Impressions(s) / ED Diagnoses   Final diagnoses:  Atypical chest pain    New Prescriptions Discharge Medication List as of 09/24/2017  6:07 PM       Alveria Apley, PA-C 09/24/17 2250    Pricilla Loveless, MD 09/25/17 [phone]

## 2017-12-19 ENCOUNTER — Inpatient Hospital Stay (HOSPITAL_COMMUNITY): Payer: 59

## 2017-12-19 ENCOUNTER — Emergency Department (HOSPITAL_COMMUNITY): Payer: 59

## 2017-12-19 ENCOUNTER — Encounter (HOSPITAL_COMMUNITY): Payer: Self-pay

## 2017-12-19 ENCOUNTER — Inpatient Hospital Stay (HOSPITAL_COMMUNITY)
Admission: EM | Admit: 2017-12-19 | Discharge: 2017-12-23 | DRG: 165 | Disposition: A | Discharge status: Home / Self Care | Payer: 59 | Attending: Cardiothoracic Surgery | Admitting: Cardiothoracic Surgery

## 2017-12-19 ENCOUNTER — Other Ambulatory Visit: Payer: Self-pay

## 2017-12-19 DIAGNOSIS — Z8249 Family history of ischemic heart disease and other diseases of the circulatory system: Secondary | ICD-10-CM | POA: Diagnosis not present

## 2017-12-19 DIAGNOSIS — J9311 Primary spontaneous pneumothorax: Principal | ICD-10-CM | POA: Diagnosis present

## 2017-12-19 DIAGNOSIS — J9383 Other pneumothorax: Secondary | ICD-10-CM | POA: Diagnosis present

## 2017-12-19 DIAGNOSIS — Z87891 Personal history of nicotine dependence: Secondary | ICD-10-CM | POA: Diagnosis not present

## 2017-12-19 DIAGNOSIS — J939 Pneumothorax, unspecified: Secondary | ICD-10-CM | POA: Diagnosis present

## 2017-12-19 DIAGNOSIS — R079 Chest pain, unspecified: Secondary | ICD-10-CM | POA: Diagnosis present

## 2017-12-19 DIAGNOSIS — I10 Essential (primary) hypertension: Secondary | ICD-10-CM | POA: Diagnosis present

## 2017-12-19 DIAGNOSIS — J449 Chronic obstructive pulmonary disease, unspecified: Secondary | ICD-10-CM | POA: Diagnosis present

## 2017-12-19 DIAGNOSIS — J439 Emphysema, unspecified: Secondary | ICD-10-CM | POA: Diagnosis present

## 2017-12-19 LAB — COMPREHENSIVE METABOLIC PANEL
ALT: 59 U/L (ref 17–63)
ALT: 61 U/L (ref 17–63)
ANION GAP: 8 (ref 5–15)
AST: 93 U/L — ABNORMAL HIGH (ref 15–41)
AST: 84 U/L — ABNORMAL HIGH (ref 15–41)
Albumin: 4 g/dL (ref 3.5–5.0)
Albumin: 4.5 g/dL (ref 3.5–5.0)
Alkaline Phosphatase: 54 U/L (ref 38–126)
Alkaline Phosphatase: 62 U/L (ref 38–126)
Anion gap: 12 (ref 5–15)
BILIRUBIN TOTAL: 0.7 mg/dL (ref 0.3–1.2)
BUN: 8 mg/dL (ref 6–20)
BUN: 7 mg/dL (ref 6–20)
CO2: 27 mmol/L (ref 22–32)
CO2: 28 mmol/L (ref 22–32)
Calcium: 8.5 mg/dL — ABNORMAL LOW (ref 8.9–10.3)
Calcium: 9.3 mg/dL (ref 8.9–10.3)
Chloride: 99 mmol/L — ABNORMAL LOW (ref 101–111)
Chloride: 95 mmol/L — ABNORMAL LOW (ref 101–111)
Creatinine, Ser: 1.09 mg/dL (ref 0.61–1.24)
Creatinine, Ser: 1.26 mg/dL — ABNORMAL HIGH (ref 0.61–1.24)
GFR calc Af Amer: >60 mL/min (ref >60)
GFR calc non Af Amer: >60 mL/min (ref >60)
Glucose, Bld: 119 mg/dL — ABNORMAL HIGH (ref 65–99)
Glucose, Bld: 125 mg/dL — ABNORMAL HIGH (ref 65–99)
POTASSIUM: 4.2 mmol/L (ref 3.5–5.1)
Potassium: 4 mmol/L (ref 3.5–5.1)
Sodium: 134 mmol/L — ABNORMAL LOW (ref 135–145)
Sodium: 135 mmol/L (ref 135–145)
TOTAL PROTEIN: 7.4 g/dL (ref 6.5–8.1)
Total Bilirubin: 1.1 mg/dL (ref 0.3–1.2)
Total Protein: 7.6 g/dL (ref 6.5–8.1)

## 2017-12-19 LAB — BLOOD GAS, ARTERIAL
Acid-Base Excess: 3.9 mmol/L — ABNORMAL HIGH (ref 0.0–2.0)
Bicarbonate: 27.8 mmol/L (ref 20.0–28.0)
Drawn by: 313941
FIO2: 21
O2 Saturation: 95.2 %
pCO2 arterial: 40.5 mmHg (ref 32.0–48.0)
pH, Arterial: 7.451 — ABNORMAL HIGH (ref 7.350–7.450)
pO2, Arterial: 76.2 mmHg — ABNORMAL LOW (ref 83.0–108.0)

## 2017-12-19 LAB — PROTIME-INR
INR: 0.94
Prothrombin Time: 12.5 seconds (ref 11.4–15.2)

## 2017-12-19 LAB — CBC
HCT: 41.7 % (ref 39.0–52.0)
HCT: 43.1 % (ref 39.0–52.0)
HEMOGLOBIN: 14.2 g/dL (ref 13.0–17.0)
Hemoglobin: 14.7 g/dL (ref 13.0–17.0)
MCH: 32.7 pg (ref 26.0–34.0)
MCH: 32.9 pg (ref 26.0–34.0)
MCHC: 34.1 g/dL (ref 30.0–36.0)
MCHC: 34.1 g/dL (ref 30.0–36.0)
MCV: 96.1 fL (ref 78.0–100.0)
MCV: 96.4 fL (ref 78.0–100.0)
PLATELETS: 312 10*3/uL (ref 150–400)
Platelets: 276 10*3/uL (ref 150–400)
RBC: 4.34 MIL/uL (ref 4.22–5.81)
RBC: 4.47 MIL/uL (ref 4.22–5.81)
RDW: 13.4 % (ref 11.5–15.5)
RDW: 13.4 % (ref 11.5–15.5)
WBC: 5 10*3/uL (ref 4.0–10.5)
WBC: 6.6 10*3/uL (ref 4.0–10.5)

## 2017-12-19 LAB — BASIC METABOLIC PANEL
ANION GAP: 12 (ref 5–15)
BUN: 8 mg/dL (ref 6–20)
CALCIUM: 9.3 mg/dL (ref 8.9–10.3)
CO2: 26 mmol/L (ref 22–32)
Chloride: 95 mmol/L — ABNORMAL LOW (ref 101–111)
Creatinine, Ser: 1.2 mg/dL (ref 0.61–1.24)
Glucose, Bld: 119 mg/dL — ABNORMAL HIGH (ref 65–99)
Potassium: 4.4 mmol/L (ref 3.5–5.1)
SODIUM: 133 mmol/L — AB (ref 135–145)

## 2017-12-19 LAB — APTT: aPTT: 29 seconds (ref 24–36)

## 2017-12-19 LAB — I-STAT TROPONIN, ED: TROPONIN I, POC: 0 ng/mL (ref 0.00–0.08)

## 2017-12-19 LAB — ABO/RH: ABO/RH(D): O POS

## 2017-12-19 LAB — SURGICAL PCR SCREEN
MRSA, PCR: NEGATIVE
Staphylococcus aureus: NEGATIVE

## 2017-12-19 LAB — TYPE AND SCREEN
ABO/RH(D): O POS
Antibody Screen: NEGATIVE

## 2017-12-19 MED ORDER — LOSARTAN POTASSIUM 50 MG PO TABS: 100.0000 mg | ORAL_TABLET | Freq: Every day | ORAL | Status: DC

## 2017-12-19 MED ORDER — SODIUM CHLORIDE 0.9 % IV SOLN: 250.0000 mL | INTRAVENOUS | Status: DC | PRN

## 2017-12-19 MED ORDER — MORPHINE SULFATE (PF) 4 MG/ML IV SOLN
4.0000 mg | Freq: Once | INTRAVENOUS | Status: AC
  Administered 2017-12-19: 4 mg via INTRAVENOUS
  Filled 2017-12-19: qty 1

## 2017-12-19 MED ORDER — MORPHINE SULFATE 2 MG/ML IV SOLN
INTRAVENOUS | Status: DC
  Administered 2017-12-19: 19:00:00 via INTRAVENOUS
  Administered 2017-12-20: 18 mg via INTRAVENOUS
  Administered 2017-12-20: 08:00:00 via INTRAVENOUS
  Administered 2017-12-20: 10.5 mg via INTRAVENOUS
  Filled 2017-12-19: qty 30

## 2017-12-19 MED ORDER — TRAMADOL HCL 50 MG PO TABS
50.0000 mg | ORAL_TABLET | Freq: Four times a day (QID) | ORAL | Status: DC | PRN
Start: 2017-12-19 — End: 2017-12-20
  Administered 2017-12-19: 50 mg via ORAL
  Filled 2017-12-19: qty 1

## 2017-12-19 MED ORDER — SODIUM CHLORIDE 0.9% FLUSH
3.0000 mL | Freq: Two times a day (BID) | INTRAVENOUS | Status: DC
  Administered 2017-12-19: 3 mL via INTRAVENOUS

## 2017-12-19 MED ORDER — TRAZODONE HCL 50 MG PO TABS: 50.0000 mg | ORAL_TABLET | Freq: Every evening | ORAL | Status: DC | PRN

## 2017-12-19 MED ORDER — DEXTROSE 5 % IV SOLN
1.5000 g | INTRAVENOUS | Status: AC
  Administered 2017-12-20: 1.5 g via INTRAVENOUS
  Filled 2017-12-19: qty 1.5

## 2017-12-19 MED ORDER — DIPHENHYDRAMINE HCL 50 MG/ML IJ SOLN: 12.5000 mg | Freq: Four times a day (QID) | INTRAMUSCULAR | Status: DC | PRN

## 2017-12-19 MED ORDER — MIDAZOLAM HCL 2 MG/2ML IJ SOLN
1.0000 mg | Freq: Once | INTRAMUSCULAR | Status: AC
  Administered 2017-12-19: 1 mg via INTRAVENOUS

## 2017-12-19 MED ORDER — DOCUSATE SODIUM 100 MG PO CAPS
100.0000 mg | ORAL_CAPSULE | Freq: Two times a day (BID) | ORAL | Status: DC
  Filled 2017-12-19: qty 1

## 2017-12-19 MED ORDER — ONDANSETRON HCL 4 MG/2ML IJ SOLN
4.0000 mg | Freq: Four times a day (QID) | INTRAMUSCULAR | Status: DC | PRN
  Administered 2017-12-19 – 2017-12-20 (×2): 4 mg via INTRAVENOUS
  Filled 2017-12-19 (×2): qty 2

## 2017-12-19 MED ORDER — BISOPROLOL-HYDROCHLOROTHIAZIDE 10-6.25 MG PO TABS
1.0000 | ORAL_TABLET | Freq: Every day | ORAL | Status: DC
  Filled 2017-12-19: qty 1

## 2017-12-19 MED ORDER — SODIUM CHLORIDE 0.9% FLUSH: 3.0000 mL | INTRAVENOUS | Status: DC | PRN

## 2017-12-19 MED ORDER — LIDOCAINE-EPINEPHRINE (PF) 2 %-1:200000 IJ SOLN
20.0000 mL | Freq: Once | INTRAMUSCULAR | Status: AC
  Administered 2017-12-19: 20 mL
  Filled 2017-12-19: qty 20

## 2017-12-19 MED ORDER — DIPHENHYDRAMINE HCL 12.5 MG/5ML PO ELIX: 12.5000 mg | ORAL_SOLUTION | Freq: Four times a day (QID) | ORAL | Status: DC | PRN

## 2017-12-19 MED ORDER — MIDAZOLAM HCL 2 MG/2ML IJ SOLN
INTRAMUSCULAR | Status: AC
Start: 2017-12-19 — End: 2017-12-20
  Filled 2017-12-19: qty 2

## 2017-12-19 MED ORDER — NALOXONE HCL 0.4 MG/ML IJ SOLN: 0.4000 mg | INTRAMUSCULAR | Status: DC | PRN

## 2017-12-19 MED ORDER — SODIUM CHLORIDE 0.9% FLUSH: 9.0000 mL | INTRAVENOUS | Status: DC | PRN

## 2017-12-19 MED ORDER — ENOXAPARIN SODIUM 30 MG/0.3ML ~~LOC~~ SOLN: 30.0000 mg | SUBCUTANEOUS | Status: DC

## 2017-12-19 NOTE — ED Notes (Signed)
Attempted to give report to 2Hc-2H04C. Charge will call back.

## 2017-12-19 NOTE — ED Triage Notes (Signed)
Patient c/o non productive cough and mid chest pain that started this AM. Patient  States difficulty in taking a deep breath.

## 2017-12-19 NOTE — Progress Notes (Addendum)
CT Surg PM  Rounds Recurrent R spontPNTXC R chest tube in place Respiratory status stable For VATS tomorrow

## 2017-12-19 NOTE — ED Provider Notes (Signed)
[facility]-EMERGENCY DEPT Provider Note   CSN: [id number] Arrival date & time: 12/19/17  1056     History   Chief Complaint Chief Complaint  Patient presents with  . Shortness of Breath  . Chest Pain  . Cough    HPI Ray [name] is a 56 y.o. male.  HPI 56 year old male with a prior history of right-sided pneumothorax who presents to the emergency department with acute onset right-sided chest pain.  He has some shortness of breath.  Prior to my evaluation he is found to have a recurrent large right-sided pneumothorax.  Prior history of intermittent cigar and cigarette use.  CT scan performed in March 2016 does demonstrate some bleb-like changes bilaterally.  Pain is moderate in severity   Past Medical History:  Diagnosis Date  . Atelectasis    r/side  . Hypertension     Patient Active Problem List   Diagnosis Date Noted  . Pneumothorax 02/26/2017  . Hypertension 02/26/2017  . Nicotine dependence 02/26/2017  . Spontaneous pneumothorax 02/26/2017    History reviewed. No pertinent surgical history.     Home Medications    Prior to Admission medications   Medication Sig Start Date End Date Taking? Authorizing Provider  bisoprolol-hydrochlorothiazide (ZIAC) 10-6.25 MG per tablet Take 1 tablet by mouth daily. 12/23/13   Nelva NayBeaton, Robert, MD  losartan (COZAAR) 100 MG tablet Take 100 mg by mouth every morning. 02/16/17   [provider]  traMADol (ULTRAM) 50 MG tablet Take 1 tablet (50 mg total) by mouth every 6 (six) hours as needed. Patient not taking: Reported on 09/24/2017 03/01/17   Randel PiggSilva Zapata, Dorma RussellEdwin, MD  traZODone (DESYREL) 50 MG tablet Take 50 mg by mouth at bedtime as needed for sleep.  01/29/17   [provider]    Family History Family History  Problem Relation Age of Onset  . Hypertension Mother     Social History Social History   Tobacco Use  . Smoking status: Former Games developermoker  . Smokeless tobacco: Never Used    Substance Use Topics  . Alcohol use: Yes    Comment: pt reports 1 beer daily  . Drug use: Yes    Types: Marijuana     Allergies   Asa [aspirin]   Review of Systems Review of Systems  All other systems reviewed and are negative.    Physical Exam Updated Vital Signs BP (!) 160/88 (BP Location: Left Arm)   Pulse 95   Temp 99 F (37.2 C) (Oral)   Resp 20   Ht 6' (1.829 m)   Wt 73.5 kg (162 lb)   SpO2 95%   BMI 21.97 kg/m   Physical Exam  Constitutional: He is oriented to person, place, and time. He appears well-developed and well-nourished.  HENT:  Head: Normocephalic and atraumatic.  Eyes: EOM are normal.  Neck: Normal range of motion.  Cardiovascular: Normal rate, regular rhythm and normal heart sounds.  Pulmonary/Chest: Effort normal. No respiratory distress.  Decreased breath sounds on the right  Abdominal: Soft. He exhibits no distension. There is no tenderness.  Musculoskeletal: Normal range of motion.  Neurological: He is alert and oriented to person, place, and time.  Skin: Skin is warm and dry.  Psychiatric: He has a normal mood and affect. Judgment normal.  Nursing note and vitals reviewed.    ED Treatments / Results  Labs (all labs ordered are listed, but only abnormal results are displayed) Labs Reviewed  BASIC METABOLIC PANEL - Abnormal; Notable for the  following components:      Result Value   Sodium 133 (*)    Chloride 95 (*)    Glucose, Bld 119 (*)    All other components within normal limits  CBC  I-STAT TROPONIN, ED    EKG  EKG Interpretation  Date/Time:  Thursday December 19 2017 11:05:35 EST Ventricular Rate:  98 PR Interval:    QRS Duration: 93 QT Interval:  344 QTC Calculation: 440 R Axis:   91 Text Interpretation:  Sinus rhythm Right atrial enlargement Borderline right axis deviation Baseline wander in lead(s) V1 No significant change was found Confirmed by Azalia Bilisampos, Azaria Stegman ([id number]) on 12/19/2017 1:36:52 PM        Radiology Dg Chest 2 View  Result Date: 12/19/2017 CLINICAL DATA:  Cough and chest pain EXAM: CHEST  2 VIEW COMPARISON:  09/24/2017 FINDINGS: Large right pneumothorax. Slight shift of the mediastinum to the left. Nipple shadow in the right mid lung. Normal heart size. Left lung clear. IMPRESSION: Large right pneumothorax. Early tension pneumothorax. Critical Value/emergent results were called by telephone at the time of interpretation on 12/19/2017 at 11:37 am to Dr. Azalia BilisKevin Yesena Reaves, who verbally acknowledged these results. Electronically Signed   By: Jolaine ClickArthur  Hoss M.D.   On: 12/19/2017 11:38    Procedures CHEST TUBE INSERTION Performed by: Azalia Bilisampos, Shivonne Schwartzman, MD Authorized by: Azalia Bilisampos, Synia Douglass, MD   Consent:    Consent obtained:  Verbal   Consent given by:  Patient   Risks discussed:  Bleeding, pain, damage to surrounding structures and infection Pre-procedure details:    Skin preparation:  Betadine   Preparation: Patient was prepped and draped in the usual sterile fashion   Sedation:    Sedation type:  Anxiolysis Anesthesia (see MAR for exact dosages):    Anesthesia method:  Local infiltration   Local anesthetic:  Lidocaine 2% WITH epi Procedure details:    Placement location:  R lateral   Tube size (Fr):  16 (Percutaneous insertion technique)   Tube connected to:  Suction   Dressing: tegaderm. Post-procedure details:    Post-insertion x-ray findings: tube in good position     Patient tolerance of procedure:  Tolerated well, no immediate complications   (including critical care time)  Medications Ordered in ED Medications  lidocaine-EPINEPHrine (XYLOCAINE W/EPI) 2 %-1:200000 (PF) injection 20 mL (not administered)     Initial Impression / Assessment and Plan / ED Course  I have reviewed the triage vital signs and the nursing notes.  Pertinent labs & imaging results that were available during my care of the patient were reviewed by me and considered in my medical decision  making (see chart for details).     11:54 AM Case discussed with CT surgery team (Dr Maren BeachVanTrigt). Agrees with pigtail catheter and [facility].  Will likely benefit from VATS given recurrent nature of right sided pneumothorax  1:33 PM Tolerated procedure. Reexpansion of lung. Chest tube in good place.   Final Clinical Impressions(s) / ED Diagnoses   Final diagnoses:  None    ED Discharge Orders    None       Azalia Bilisampos, Margrett Kalb, MD 12/19/17 [phone]

## 2017-12-19 NOTE — H&P (Signed)
Subjective:   Patient is a 56 y.o. male presents with a nonproductive cough with some chest pain.. Onset of symptoms was abrupt starting a few hours ago with gradually worsening course since that time.  The [facility] was found to have a large right side pneumothorax requiring chest tube placement.  We are asked to see the patient for admission the [facility] is being [facility] for further management to include obtaining a CAT scan of the chest and possible video-assisted thoracoscopy.  The pain was primarily right-sided and was associated with some shortness of breath.  He has a history of both cigar and cigarette use.  In March 2016 he was found on CAT scan to have blebs bilaterally.  This is a recurrence on the right side.  Currently patients symptoms have improved some.  He continues to have some cough.  Patient Active Problem List   Diagnosis Date Noted  . Pneumothorax on right 12/19/2017  . Pneumothorax 02/26/2017  . Hypertension 02/26/2017  . Nicotine dependence 02/26/2017  . Spontaneous pneumothorax 02/26/2017   Past Medical History:  Diagnosis Date  . Atelectasis    r/side  . Hypertension     History reviewed. No pertinent surgical history.   (Not in a hospital admission) Allergies  Allergen Reactions  . Asa [Aspirin] Nausea Only    Social History   Tobacco Use  . Smoking status: Former Games developermoker  . Smokeless tobacco: Never Used  Substance Use Topics  . Alcohol use: Yes    Comment: pt reports 1 beer daily    Family History  Problem Relation Age of Onset  . Hypertension Mother     Review of Systems Pertinent items noted in HPI and remainder of comprehensive ROS otherwise negative.  Objective:   Patient Vitals for the past 8 hrs:  BP Temp Temp src Pulse Resp SpO2 Height Weight  12/19/17 1500 (!) 145/89 - - 79 (!) 21 98 % - -  12/19/17 1445 139/78 - - 84 19 98 % - -  12/19/17  1400 124/84 - - 88 (!) 21 98 % - -  12/19/17 1330 (!) 145/83 - - 93 (!) 26 99 % - -  12/19/17 1315 - - - 93 (!) 26 97 % - -  12/19/17 1300 (!) 153/86 - - (!) 104 - (!) 70 % - -  12/19/17 1230 - - - (!) 103 19 95 % - -  12/19/17 1200 (!) 157/96 - - 91 (!) 23 96 % - -  12/19/17 1149 (!) 172/92 - - (!) 102 (!) 24 93 % - -  12/19/17 1112 - - - - - - 6' (1.829 m) 162 lb (73.5 kg)  12/19/17 1106 (!) 160/88 99 F (37.2 C) Oral 95 20 95 % - -   No intake/output data recorded. No intake/output data recorded.   BP (!) 145/89   Pulse 61   Temp 98.4 F (36.9 C) (Oral)   Resp (!) 26   Ht 6' (1.829 m)   Wt 162 lb (73.5 kg)   SpO2 99%   BMI 21.97 kg/m  General appearance: alert, cooperative and no distress Lungs: clear to auscultation bilaterally Heart: regular rate and rhythm Abdomen: soft, non-tender; bowel sounds normal; no masses,  no organomegaly Extremities: extremities normal, atraumatic, no cyanosis or edema Neurologic: Grossly normal  Data Review CBC:  Lab Results  Component Value Date   WBC 5.0 12/19/2017  RBC 4.34 12/19/2017   BMP:  Lab Results  Component Value Date   GLUCOSE 119 (H) 12/19/2017   CO2 27 12/19/2017   BUN 8 12/19/2017   CREATININE 1.09 12/19/2017   CALCIUM 8.5 (L) 12/19/2017   Coagulation:  Lab Results  Component Value Date   INR 0.92 02/26/2017   Cardiac markers: No results found for: CKMB, TROPONINT, MYOGLOBIN ABGs: No results found for: [facility] Rad[facility] review: Large right-sided pneumothorax prior to chest tube placement.  The lung is fully reexpanded currently.  Assessment:   Active Problems:   Spontaneous pneumothorax   Pneumothorax on right  Patient Active Problem List   Diagnosis Date Noted  . Pneumothorax on right 12/19/2017  . Pneumothorax 02/26/2017  . Hypertension 02/26/2017  . Nicotine dependence 02/26/2017  . Spontaneous pneumothorax 02/26/2017    Plan:   Admission for chest tube management and VATS for bleb resection and  pleurodesis tomorrow   I have seen the patient reviewed his history, we have renewed his x-rays with him and his family.  With the recurrence in less than 9 months with a spontaneous pneumothorax on the right I recommended to the patient that we proceed with right video-assisted thoracoscopy and bronchoscopy with stapling of blebs.  Risks and options of surgery were discussed with the patient in detail.  I explained to him that with the second pneumothorax the chance of recurrent problems is much greater without surgery.  He is willing to proceed.  We will plan to proceed with surgery tomorrow.  The goals risks and alternatives of the planned surgical procedure Procedure(s): VIDEO BRONCHOSCOPY (N/A) VIDEO ASSISTED THORACOSCOPY (Right) STAPLING OF BLEBS (Right)  have been discussed with the patient in detail. The risks of the procedure including death, infection, stroke, myocardial infarction, bleeding, blood transfusion have all been discussed specifically.  I have quoted [employer] a 1 % of perioperative mortality and a complication rate as high as 30 %. The patient's questions have been answered.Dia Sitteryrone Vanwart is willing  to proceed with the planned procedure.  Delight OvensEdward B Zeppelin Beckstrand MD      [address] [place] [id number] Office [phone]   Beeper [phone]

## 2017-12-20 ENCOUNTER — Inpatient Hospital Stay (HOSPITAL_COMMUNITY): Payer: 59

## 2017-12-20 ENCOUNTER — Inpatient Hospital Stay (HOSPITAL_COMMUNITY): Payer: 59 | Admitting: Certified Registered"

## 2017-12-20 ENCOUNTER — Encounter (HOSPITAL_COMMUNITY)
Admission: EM | Disposition: A | Discharge status: Home / Self Care | Payer: Self-pay | Source: Home / Self Care | Attending: Cardiothoracic Surgery

## 2017-12-20 DIAGNOSIS — J439 Emphysema, unspecified: Secondary | ICD-10-CM | POA: Diagnosis present

## 2017-12-20 DIAGNOSIS — J9311 Primary spontaneous pneumothorax: Secondary | ICD-10-CM

## 2017-12-20 HISTORY — PX: VIDEO ASSISTED THORACOSCOPY: SHX5073

## 2017-12-20 HISTORY — PX: STAPLING OF BLEBS: SHX6429

## 2017-12-20 HISTORY — PX: VIDEO BRONCHOSCOPY: SHX5072

## 2017-12-20 LAB — BASIC METABOLIC PANEL
Anion gap: 12 (ref 5–15)
BUN: 7 mg/dL (ref 6–20)
CALCIUM: 9.3 mg/dL (ref 8.9–10.3)
CO2: 29 mmol/L (ref 22–32)
CREATININE: 1.33 mg/dL — AB (ref 0.61–1.24)
Chloride: 96 mmol/L — ABNORMAL LOW (ref 101–111)
GFR calc non Af Amer: 59 mL/min — ABNORMAL LOW (ref >60)
Glucose, Bld: 117 mg/dL — ABNORMAL HIGH (ref 65–99)
Potassium: 4.1 mmol/L (ref 3.5–5.1)
SODIUM: 137 mmol/L (ref 135–145)

## 2017-12-20 LAB — GLUCOSE, CAPILLARY
Glucose-Capillary: 193 mg/dL — ABNORMAL HIGH (ref 65–99)
Glucose-Capillary: 157 mg/dL — ABNORMAL HIGH (ref 65–99)

## 2017-12-20 LAB — HIV ANTIBODY (ROUTINE TESTING W REFLEX): HIV Screen 4th Generation wRfx: NONREACTIVE

## 2017-12-20 SURGERY — BRONCHOSCOPY, VIDEO-ASSISTED
Anesthesia: General | Site: Chest | Laterality: Right

## 2017-12-20 MED ORDER — DEXAMETHASONE SODIUM PHOSPHATE 10 MG/ML IJ SOLN
INTRAMUSCULAR | Status: DC | PRN
  Administered 2017-12-20: 10 mg via INTRAVENOUS

## 2017-12-20 MED ORDER — OXYCODONE HCL 5 MG PO TABS: 5.0000 mg | ORAL_TABLET | ORAL | Status: DC | PRN

## 2017-12-20 MED ORDER — MIDAZOLAM HCL 2 MG/2ML IJ SOLN
INTRAMUSCULAR | Status: AC
  Filled 2017-12-20: qty 2

## 2017-12-20 MED ORDER — VANCOMYCIN HCL IN DEXTROSE 1-5 GM/200ML-% IV SOLN
1000.0000 mg | Freq: Two times a day (BID) | INTRAVENOUS | Status: AC
  Administered 2017-12-20: 1000 mg via INTRAVENOUS
  Filled 2017-12-20: qty 200

## 2017-12-20 MED ORDER — PROPOFOL 10 MG/ML IV BOLUS
INTRAVENOUS | Status: AC
  Filled 2017-12-20: qty 20

## 2017-12-20 MED ORDER — PROPOFOL 10 MG/ML IV BOLUS
INTRAVENOUS | Status: DC | PRN
Start: 2017-12-20 — End: 2017-12-20
  Administered 2017-12-20: 150 mg via INTRAVENOUS

## 2017-12-20 MED ORDER — LACTATED RINGERS IV SOLN
INTRAVENOUS | Status: DC | PRN
  Administered 2017-12-20: 11:00:00 via INTRAVENOUS

## 2017-12-20 MED ORDER — SUGAMMADEX SODIUM 200 MG/2ML IV SOLN
INTRAVENOUS | Status: DC | PRN
  Administered 2017-12-20: 200 mg via INTRAVENOUS

## 2017-12-20 MED ORDER — FENTANYL CITRATE (PF) 250 MCG/5ML IJ SOLN
INTRAMUSCULAR | Status: AC
  Filled 2017-12-20: qty 5

## 2017-12-20 MED ORDER — DEXTROSE 5 % IV SOLN
1.5000 g | Freq: Two times a day (BID) | INTRAVENOUS | Status: AC
  Administered 2017-12-20 – 2017-12-21 (×2): 1.5 g via INTRAVENOUS
  Filled 2017-12-20 (×2): qty 1.5

## 2017-12-20 MED ORDER — FENTANYL CITRATE (PF) 100 MCG/2ML IJ SOLN
INTRAMUSCULAR | Status: AC
  Administered 2017-12-20: 50 ug
  Filled 2017-12-20: qty 2

## 2017-12-20 MED ORDER — POTASSIUM CHLORIDE 10 MEQ/50ML IV SOLN: 10.0000 meq | Freq: Every day | INTRAVENOUS | Status: DC | PRN

## 2017-12-20 MED ORDER — 0.9 % SODIUM CHLORIDE (POUR BTL) OPTIME
TOPICAL | Status: DC | PRN
  Administered 2017-12-20: 1000 mL

## 2017-12-20 MED ORDER — ACETAMINOPHEN 160 MG/5ML PO SOLN
1000.0000 mg | Freq: Four times a day (QID) | ORAL | Status: DC
  Filled 2017-12-20: qty 40.6

## 2017-12-20 MED ORDER — FENTANYL CITRATE (PF) 100 MCG/2ML IJ SOLN
INTRAMUSCULAR | Status: DC | PRN
  Administered 2017-12-20 (×2): 50 ug via INTRAVENOUS
  Administered 2017-12-20: 100 ug via INTRAVENOUS
  Administered 2017-12-20 (×2): 50 ug via INTRAVENOUS

## 2017-12-20 MED ORDER — FENTANYL CITRATE (PF) 100 MCG/2ML IJ SOLN: 25.0000 ug | INTRAMUSCULAR | Status: DC | PRN

## 2017-12-20 MED ORDER — LOSARTAN POTASSIUM 50 MG PO TABS
100.0000 mg | ORAL_TABLET | Freq: Every day | ORAL | Status: DC
  Administered 2017-12-20 – 2017-12-23 (×4): 100 mg via ORAL
  Filled 2017-12-20 (×5): qty 2

## 2017-12-20 MED ORDER — GLYCOPYRROLATE 0.2 MG/ML IV SOSY
PREFILLED_SYRINGE | INTRAVENOUS | Status: DC | PRN
  Administered 2017-12-20: .2 mg via INTRAVENOUS

## 2017-12-20 MED ORDER — TRAMADOL HCL 50 MG PO TABS
50.0000 mg | ORAL_TABLET | Freq: Four times a day (QID) | ORAL | Status: DC | PRN
  Administered 2017-12-22: 100 mg via ORAL
  Filled 2017-12-20: qty 2

## 2017-12-20 MED ORDER — ROCURONIUM BROMIDE 10 MG/ML (PF) SYRINGE
PREFILLED_SYRINGE | INTRAVENOUS | Status: DC | PRN
  Administered 2017-12-20: 50 mg via INTRAVENOUS
  Administered 2017-12-20: 20 mg via INTRAVENOUS
  Administered 2017-12-20 (×2): 10 mg via INTRAVENOUS

## 2017-12-20 MED ORDER — MIDAZOLAM HCL 2 MG/2ML IJ SOLN
INTRAMUSCULAR | Status: AC
  Administered 2017-12-20: 1 mg
  Filled 2017-12-20: qty 2

## 2017-12-20 MED ORDER — ALBUTEROL SULFATE (2.5 MG/3ML) 0.083% IN NEBU
2.5000 mg | INHALATION_SOLUTION | RESPIRATORY_TRACT | Status: DC
  Administered 2017-12-20 – 2017-12-21 (×3): 2.5 mg via RESPIRATORY_TRACT
  Filled 2017-12-20 (×3): qty 3

## 2017-12-20 MED ORDER — DIPHENHYDRAMINE HCL 50 MG/ML IJ SOLN: 12.5000 mg | Freq: Four times a day (QID) | INTRAMUSCULAR | Status: DC | PRN

## 2017-12-20 MED ORDER — NALOXONE HCL 0.4 MG/ML IJ SOLN: 0.4000 mg | INTRAMUSCULAR | Status: DC | PRN

## 2017-12-20 MED ORDER — SODIUM CHLORIDE 0.9% FLUSH
9.0000 mL | INTRAVENOUS | Status: DC | PRN
Start: 2017-12-20 — End: 2017-12-22

## 2017-12-20 MED ORDER — ALBUTEROL SULFATE (2.5 MG/3ML) 0.083% IN NEBU: 2.5000 mg | INHALATION_SOLUTION | RESPIRATORY_TRACT | Status: DC

## 2017-12-20 MED ORDER — LIDOCAINE 2% (20 MG/ML) 5 ML SYRINGE
INTRAMUSCULAR | Status: DC | PRN
  Administered 2017-12-20: 100 mg via INTRAVENOUS

## 2017-12-20 MED ORDER — DEXTROSE-NACL 5-0.45 % IV SOLN
INTRAVENOUS | Status: DC
  Administered 2017-12-20 – 2017-12-21 (×2): via INTRAVENOUS

## 2017-12-20 MED ORDER — SENNOSIDES-DOCUSATE SODIUM 8.6-50 MG PO TABS
1.0000 | ORAL_TABLET | Freq: Every day | ORAL | Status: DC
  Administered 2017-12-20 – 2017-12-21 (×2): 1 via ORAL
  Filled 2017-12-20 (×2): qty 1

## 2017-12-20 MED ORDER — ACETAMINOPHEN 500 MG PO TABS
1000.0000 mg | ORAL_TABLET | Freq: Four times a day (QID) | ORAL | Status: DC
  Administered 2017-12-20 – 2017-12-22 (×8): 1000 mg via ORAL
  Filled 2017-12-20 (×7): qty 2

## 2017-12-20 MED ORDER — BISOPROLOL-HYDROCHLOROTHIAZIDE 10-6.25 MG PO TABS
1.0000 | ORAL_TABLET | Freq: Every day | ORAL | Status: DC
  Administered 2017-12-20 – 2017-12-23 (×4): 1 via ORAL
  Filled 2017-12-20 (×3): qty 1

## 2017-12-20 MED ORDER — FENTANYL 40 MCG/ML IV SOLN
INTRAVENOUS | Status: DC
  Administered 2017-12-20: 330 ug via INTRAVENOUS
  Administered 2017-12-20: 1000 ug via INTRAVENOUS
  Administered 2017-12-20: 4.88 mL via INTRAVENOUS
  Administered 2017-12-21: 60 ug via INTRAVENOUS
  Administered 2017-12-21: 165 ug via INTRAVENOUS
  Administered 2017-12-21: 120 ug via INTRAVENOUS
  Administered 2017-12-21: 90 ug via INTRAVENOUS
  Administered 2017-12-21: 105 ug via INTRAVENOUS
  Administered 2017-12-21: 60 ug via INTRAVENOUS
  Administered 2017-12-21: 105 ug via INTRAVENOUS
  Administered 2017-12-21: 1000 ug via INTRAVENOUS
  Administered 2017-12-22: 15 ug via INTRAVENOUS
  Filled 2017-12-20: qty 25

## 2017-12-20 MED ORDER — INSULIN ASPART 100 UNIT/ML ~~LOC~~ SOLN
0.0000 [IU] | SUBCUTANEOUS | Status: DC
  Administered 2017-12-20: 2 [IU] via SUBCUTANEOUS
  Administered 2017-12-20: 4 [IU] via SUBCUTANEOUS
  Administered 2017-12-21: 8 [IU] via SUBCUTANEOUS
  Administered 2017-12-21: 2 [IU] via SUBCUTANEOUS

## 2017-12-20 MED ORDER — DIPHENHYDRAMINE HCL 12.5 MG/5ML PO ELIX
12.5000 mg | ORAL_SOLUTION | Freq: Four times a day (QID) | ORAL | Status: DC | PRN
  Administered 2017-12-20 – 2017-12-21 (×2): 12.5 mg via ORAL
  Filled 2017-12-20 (×2): qty 5

## 2017-12-20 MED ORDER — ONDANSETRON HCL 4 MG/2ML IJ SOLN
4.0000 mg | Freq: Four times a day (QID) | INTRAMUSCULAR | Status: DC | PRN
Start: 2017-12-20 — End: 2017-12-22
  Administered 2017-12-22: 4 mg via INTRAVENOUS
  Filled 2017-12-20: qty 2

## 2017-12-20 MED ORDER — BISACODYL 5 MG PO TBEC
10.0000 mg | DELAYED_RELEASE_TABLET | Freq: Every day | ORAL | Status: DC
  Administered 2017-12-20 – 2017-12-21 (×2): 10 mg via ORAL
  Filled 2017-12-20 (×2): qty 2

## 2017-12-20 MED ORDER — ONDANSETRON HCL 4 MG/2ML IJ SOLN
INTRAMUSCULAR | Status: DC | PRN
  Administered 2017-12-20: 4 mg via INTRAVENOUS

## 2017-12-20 SURGICAL SUPPLY — 89 items
ADH SKN CLS APL DERMABOND .7 (GAUZE/BANDAGES/DRESSINGS) ×3
APL SRG 22X2 LUM MLBL SLNT (VASCULAR PRODUCTS)
APL SRG 7X2 LUM MLBL SLNT (VASCULAR PRODUCTS)
APPLICATOR TIP COSEAL (VASCULAR PRODUCTS) IMPLANT
APPLICATOR TIP EXT COSEAL (VASCULAR PRODUCTS) IMPLANT
APPLIER CLIP ROT 10 11.4 M/L (STAPLE) ×4
APR CLP MED LRG 11.4X10 (STAPLE) ×3
BAG SPEC RTRVL LRG 6X4 10 (ENDOMECHANICALS) ×6
BLADE SURG 11 STRL SS (BLADE) IMPLANT
BRUSH CYTOL CELLEBRITY 1.5X140 (MISCELLANEOUS) IMPLANT
CANISTER SUCT 3000ML PPV (MISCELLANEOUS) ×4 IMPLANT
CATH KIT ON Q 5IN SLV (PAIN MANAGEMENT) IMPLANT
CATH THORACIC 28FR (CATHETERS) IMPLANT
CATH THORACIC 36FR (CATHETERS) IMPLANT
CATH THORACIC 36FR RT ANG (CATHETERS) IMPLANT
CLEANER TIP ELECTROSURG 2X2 (MISCELLANEOUS) ×5 IMPLANT
CLIP APPLIE ROT 10 11.4 M/L (STAPLE) IMPLANT
CLIP VESOCCLUDE MED 24/CT (CLIP) ×1 IMPLANT
CLIP VESOCCLUDE MED 6/CT (CLIP) IMPLANT
CONN ST 1/4X3/8  BEN (MISCELLANEOUS) ×2
CONN ST 1/4X3/8 BEN (MISCELLANEOUS) IMPLANT
CONT SPEC 4OZ CLIKSEAL STRL BL (MISCELLANEOUS) ×8 IMPLANT
COVER BACK TABLE 60X90IN (DRAPES) ×3 IMPLANT
CUTTER ECHEON FLEX ENDO 45 340 (ENDOMECHANICALS) ×1 IMPLANT
DERMABOND ADVANCED (GAUZE/BANDAGES/DRESSINGS) ×1
DERMABOND ADVANCED .7 DNX12 (GAUZE/BANDAGES/DRESSINGS) IMPLANT
DRAIN CHANNEL 28F RND 3/8 FF (WOUND CARE) ×3 IMPLANT
DRAPE LAPAROSCOPIC ABDOMINAL (DRAPES) ×4 IMPLANT
DRAPE WARM FLUID 44X44 (DRAPE) ×3 IMPLANT
DRILL BIT 7/64X5 (BIT) IMPLANT
ELECT BLADE 4.0 EZ CLEAN MEGAD (MISCELLANEOUS) ×8
ELECT REM PT RETURN 9FT ADLT (ELECTROSURGICAL) ×4
ELECTRODE BLDE 4.0 EZ CLN MEGD (MISCELLANEOUS) ×3 IMPLANT
ELECTRODE REM PT RTRN 9FT ADLT (ELECTROSURGICAL) ×3 IMPLANT
FORCEPS BIOP RJ4 1.8 (CUTTING FORCEPS) IMPLANT
GAUZE SPONGE 4X4 12PLY STRL (GAUZE/BANDAGES/DRESSINGS) ×4 IMPLANT
GAUZE SPONGE 4X4 12PLY STRL LF (GAUZE/BANDAGES/DRESSINGS) ×1 IMPLANT
GLOVE BIO SURGEON STRL SZ 6.5 (GLOVE) ×10 IMPLANT
GOWN STRL REUS W/ TWL LRG LVL3 (GOWN DISPOSABLE) ×9 IMPLANT
GOWN STRL REUS W/TWL LRG LVL3 (GOWN DISPOSABLE) ×12
KIT BASIN OR (CUSTOM PROCEDURE TRAY) ×4 IMPLANT
KIT CLEAN ENDO COMPLIANCE (KITS) ×4 IMPLANT
KIT ROOM TURNOVER OR (KITS) ×4 IMPLANT
KIT SUCTION CATH 14FR (SUCTIONS) ×4 IMPLANT
MARKER SKIN DUAL TIP RULER LAB (MISCELLANEOUS) IMPLANT
NS IRRIG 1000ML POUR BTL (IV SOLUTION) ×8 IMPLANT
OIL SILICONE PENTAX (PARTS (SERVICE/REPAIRS)) ×4 IMPLANT
PACK CHEST (CUSTOM PROCEDURE TRAY) ×4 IMPLANT
PAD ARMBOARD 7.5X6 YLW CONV (MISCELLANEOUS) ×8 IMPLANT
PASSER SUT SWANSON 36MM LOOP (INSTRUMENTS) IMPLANT
PATTIES SURGICAL .5X1.5 (GAUZE/BANDAGES/DRESSINGS) ×1 IMPLANT
POUCH SPECIMEN RETRIEVAL 10MM (ENDOMECHANICALS) ×2 IMPLANT
RELOAD STAPLE 45 GOLD REG/THCK (STAPLE) IMPLANT
RUBBERBAND STERILE (MISCELLANEOUS) ×1 IMPLANT
SCISSORS LAP 5X35 DISP (ENDOMECHANICALS) ×1 IMPLANT
SEALANT PROGEL (MISCELLANEOUS) IMPLANT
SEALANT SURG COSEAL 4ML (VASCULAR PRODUCTS) IMPLANT
SEALANT SURG COSEAL 8ML (VASCULAR PRODUCTS) IMPLANT
SOLUTION ANTI FOG 6CC (MISCELLANEOUS) ×4 IMPLANT
SPONGE TONSIL 1 RF SGL (DISPOSABLE) ×1 IMPLANT
STAPLE RELOAD 45MM GOLD (STAPLE) ×16 IMPLANT
SUT PROLENE 3 0 SH DA (SUTURE) IMPLANT
SUT PROLENE 4 0 RB 1 (SUTURE)
SUT PROLENE 4-0 RB1 .5 CRCL 36 (SUTURE) IMPLANT
SUT SILK  1 MH (SUTURE) ×4
SUT SILK 1 MH (SUTURE) ×12 IMPLANT
SUT SILK 2 0SH CR/8 30 (SUTURE) IMPLANT
SUT SILK 3 0SH CR/8 30 (SUTURE) IMPLANT
SUT VIC AB 1 CTX 18 (SUTURE) IMPLANT
SUT VIC AB 1 CTX 36 (SUTURE)
SUT VIC AB 1 CTX36XBRD ANBCTR (SUTURE) IMPLANT
SUT VIC AB 2-0 CTX 36 (SUTURE) ×1 IMPLANT
SUT VIC AB 2-0 UR6 27 (SUTURE) ×1 IMPLANT
SUT VIC AB 3-0 X1 27 (SUTURE) ×2 IMPLANT
SUT VICRYL 2 TP 1 (SUTURE) IMPLANT
SWAB COLLECTION DEVICE MRSA (MISCELLANEOUS) IMPLANT
SWAB CULTURE ESWAB REG 1ML (MISCELLANEOUS) IMPLANT
SYR 20ML ECCENTRIC (SYRINGE) ×4 IMPLANT
SYSTEM SAHARA CHEST DRAIN ATS (WOUND CARE) ×4 IMPLANT
TAPE CLOTH 4X10 WHT NS (GAUZE/BANDAGES/DRESSINGS) ×4 IMPLANT
TIP APPLICATOR SPRAY EXTEND 16 (VASCULAR PRODUCTS) IMPLANT
TOWEL GREEN STERILE (TOWEL DISPOSABLE) ×4 IMPLANT
TOWEL GREEN STERILE FF (TOWEL DISPOSABLE) ×4 IMPLANT
TRAP SPECIMEN MUCOUS 40CC (MISCELLANEOUS) IMPLANT
TRAY FOLEY CATH SILVER 16FR LF (SET/KITS/TRAYS/PACK) ×4 IMPLANT
TROCAR BLADELESS 12MM (ENDOMECHANICALS) IMPLANT
TROCAR XCEL BLUNT TIP 100MML (ENDOMECHANICALS) ×1 IMPLANT
TUBE CONNECTING 20X1/4 (TUBING) ×4 IMPLANT
WATER STERILE IRR 1000ML POUR (IV SOLUTION) ×8 IMPLANT

## 2017-12-20 NOTE — Care Management Note (Signed)
Case Management Note Donn PieriniKristi Chandrika Sandles RN, BSN Unit 4E-Case Manager-- 2H coverage [phone]  Patient Details  Name: Ray [name] MRN: [id number] Date of Birth: [birth year]  Subjective/Objective:   Pt admitted with recurrent Pntx- s/p VATS on 12/20/17                 Action/Plan: PTA pt lived at home with spouse- anticipate return home- CM to follow  Expected Discharge Date:                  Expected Discharge Plan:  Home/Self Care  In-House Referral:     Discharge planning Services  CM Consult  Post Acute Care Choice:    Choice offered to:     DME Arranged:    DME Agency:     HH Arranged:    HH Agency:     Status of Service:  In process, will continue to follow  If discussed at Long Length of Stay Meetings, dates discussed:    Discharge Disposition:   Additional Comments:  Ray [name], Ray Debruler Hall, RN 12/20/2017, 3:32 PM

## 2017-12-20 NOTE — Progress Notes (Signed)
Patient ID: Ray [name], male   DOB: January 31, 1961, 56 y.o.   MRN: [id number] TCTS evening rounds:  Hemodynamically stable. Pain under control No air leak from chest tube.

## 2017-12-20 NOTE — Anesthesia Procedure Notes (Signed)
Procedure Name: Intubation Date/Time: 12/20/2017 11:05 AM Performed by: Imagene Riches, CRNA Pre-anesthesia Checklist: Patient identified, Emergency Drugs available, Suction available and Patient being monitored Patient Re-evaluated:Patient Re-evaluated prior to induction Oxygen Delivery Method: Circle System Utilized Preoxygenation: Pre-oxygenation with 100% oxygen Induction Type: IV induction Ventilation: Mask ventilation without difficulty Laryngoscope Size: Mac and 3 Grade View: Grade II Tube type: Oral Endobronchial tube: Left and Double lumen EBT and 39 Fr Number of attempts: 1 Airway Equipment and Method: Stylet and Oral airway Placement Confirmation: ETT inserted through vocal cords under direct vision,  positive ETCO2 and breath sounds checked- equal and bilateral Tube secured with: Tape Dental Injury: Teeth and Oropharynx as per pre-operative assessment

## 2017-12-20 NOTE — Brief Op Note (Addendum)
      [address]       [place] [id number]             [phone]      12/20/2017  1:13 PM  PATIENT:  Ray [name]  56 y.o. male  PRE-OPERATIVE DIAGNOSIS:  recurrent right ptx  POST-OPERATIVE DIAGNOSIS:  recurrent right ptx  PROCEDURE:  Procedure(s): VIDEO BRONCHOSCOPY (N/A) VIDEO ASSISTED THORACOSCOPY (Right) STAPLING OF BLEBS (Right)  SURGEON:  Surgeon(s) and Role:    * Delight OvensGerhardt, Cimone Fahey B, MD - Primary  PHYSICIAN ASSISTANT:  Jari Favreessa Conte, PA-C   ANESTHESIA:   general  EBL:  100 mL   BLOOD ADMINISTERED:none  DRAINS: ONE BLAKE DRAIN   LOCAL MEDICATIONS USED:  NONE  SPECIMEN:  Source of Specimen:  BLEBS  DISPOSITION OF SPECIMEN:  PATHOLOGY  COUNTS:  YES  DICTATION: .Dragon Dictation  PLAN OF CARE: Admit to inpatient   PATIENT DISPOSITION:  ICU - intubated and hemodynamically stable.   Delay start of Pharmacological VTE agent (>24hrs) due to surgical blood loss or risk of bleeding: yes

## 2017-12-20 NOTE — Anesthesia Procedure Notes (Signed)
Arterial Line Insertion Start/End12/28/2018 10:30 AM, 12/20/2017 10:35 AM Performed by: Rosalio MacadamiaHayes, Christine T, CRNA, CRNA  Patient location: Pre-op. Preanesthetic checklist: patient identified, IV checked, site marked, risks and benefits discussed, surgical consent, monitors and equipment checked, pre-op evaluation and anesthesia consent Left, radial was placed Catheter size: 20 G Hand hygiene performed  and maximum sterile barriers used   Attempts: 1 Procedure performed without using ultrasound guided technique. Following insertion, dressing applied and Biopatch. Post procedure assessment: normal  Patient tolerated the procedure well with no immediate complications.

## 2017-12-20 NOTE — Anesthesia Postprocedure Evaluation (Signed)
Anesthesia Post Note  Patient: Reise Ergle  Procedure(s) Performed: VIDEO BRONCHOSCOPY (N/A ) VIDEO ASSISTED THORACOSCOPY (Right Chest) STAPLING OF BLEBS (Right )     Patient location during evaluation: PACU Anesthesia Type: General Level of consciousness: awake and sedated Pain management: pain level controlled Vital Signs Assessment: post-procedure vital signs reviewed and stable Respiratory status: spontaneous breathing, nonlabored ventilation, respiratory function stable and patient connected to nasal cannula oxygen Cardiovascular status: blood pressure returned to baseline and stable Postop Assessment: no apparent nausea or vomiting Anesthetic complications: no    Last Vitals:  Vitals:   12/20/17 1436 12/20/17 1440  BP: (!) 149/99 (!) 149/93  Pulse: 100 95  Resp: 14 (!) 8  Temp:  (!) 36.3 C  SpO2: 100% 100%    Last Pain:  Vitals:   12/20/17 1440  TempSrc:   PainSc: Asleep                 Laelyn Blumenthal,JAMES TERRILL

## 2017-12-20 NOTE — Transfer of Care (Signed)
Immediate Anesthesia Transfer of Care Note  Patient: Cordarrell Mogg  Procedure(s) Performed: VIDEO BRONCHOSCOPY (N/A ) VIDEO ASSISTED THORACOSCOPY (Right Chest) STAPLING OF BLEBS (Right )  Patient Location: PACU  Anesthesia Type:General  Level of Consciousness: awake, alert  and oriented  Airway & Oxygen Therapy: Patient Spontanous Breathing and Patient connected to nasal cannula oxygen  Post-op Assessment: Report given to RN and Post -op Vital signs reviewed and stable  Post vital signs: Reviewed and stable  Last Vitals:  Vitals:   12/20/17 0800 12/20/17 1000  BP: (!) 144/86 (!) 153/79  Pulse: 69 64  Resp: 17 (!) 21  Temp:    SpO2: 100% 100%    Last Pain:  Vitals:   12/20/17 0800  TempSrc:   PainSc: 0-No pain         Complications: No apparent anesthesia complications

## 2017-12-20 NOTE — Anesthesia Procedure Notes (Addendum)
Central Venous Catheter Insertion Performed by: Sharee HolsterMassagee, Abcde Oneil, MD, anesthesiologist Start/End12/28/2018 9:50 AM, 12/20/2017 10:06 AM Preanesthetic checklist: patient identified, IV checked, site marked, risks and benefits discussed, surgical consent, monitors and equipment checked, pre-op evaluation and timeout performed Position: Trendelenburg Lidocaine 1% used for infiltration and patient sedated Hand hygiene performed , maximum sterile barriers used  and Seldinger technique used Catheter size: 7.5 Fr Central line was placed.Double lumen Procedure performed using ultrasound guided technique. Ultrasound Notes:anatomy identified, needle tip was noted to be adjacent to the nerve/plexus identified, no ultrasound evidence of intravascular and/or intraneural injection and image(s) printed for medical record Attempts: 1 Following insertion, line sutured, dressing applied and Biopatch. Post procedure assessment: blood return through all ports, free fluid flow and no air  Patient tolerated the procedure well with no immediate complications.

## 2017-12-20 NOTE — Progress Notes (Signed)
      [address]       [place] [id number]             [phone]     Pre Procedure note for inpatients:   Ray [name] has been scheduled for Procedure(s): VIDEO BRONCHOSCOPY (N/A) VIDEO ASSISTED THORACOSCOPY (Right) STAPLING OF BLEBS (Right) today. The various methods of treatment have been discussed with the patient. After consideration of the risks, benefits and treatment options the patient has consented to the planned procedure.   The patient has been seen and labs reviewed. There are no changes in the patient's condition to prevent proceeding with the planned procedure today.  Recent labs:  Lab Results  Component Value Date   WBC 6.6 12/19/2017   HGB 14.7 12/19/2017   HCT 43.1 12/19/2017   PLT 276 12/19/2017   GLUCOSE 117 (H) 12/20/2017   ALT 61 12/19/2017   AST 84 (H) 12/19/2017   NA 137 12/20/2017   K 4.1 12/20/2017   CL 96 (L) 12/20/2017   CREATININE 1.33 (H) 12/20/2017   BUN 7 12/20/2017   CO2 29 12/20/2017   INR 0.94 12/19/2017    Delight OvensEdward B Denasia Venn, MD 12/20/2017 7:31 AM

## 2017-12-20 NOTE — Anesthesia Preprocedure Evaluation (Signed)
Anesthesia Evaluation  Patient identified by MRN, date of birth, ID band Patient awake    Reviewed: Allergy & Precautions, NPO status , Patient's Chart, lab work & pertinent test results  History of Anesthesia Complications Negative for: history of anesthetic complications  Airway Mallampati: II       Dental   Pulmonary COPD, former smoker,    breath sounds clear to auscultation       Cardiovascular hypertension,  Rhythm:Regular Rate:Normal     Neuro/Psych    GI/Hepatic negative GI ROS, Neg liver ROS,   Endo/Other  negative endocrine ROS  Renal/GU negative Renal ROS     Musculoskeletal   Abdominal   Peds  Hematology negative hematology ROS (+)   Anesthesia Other Findings   Reproductive/Obstetrics                             Anesthesia Physical Anesthesia Plan  ASA: III  Anesthesia Plan: General   Post-op Pain Management:    Induction:   PONV Risk Score and Plan: 3 and Treatment may vary due to age or medical condition  Airway Management Planned: Double Lumen EBT  Additional Equipment: Arterial line and CVP  Intra-op Plan:   Post-operative Plan: Extubation in OR  Informed Consent: I have reviewed the patients History and Physical, chart, labs and discussed the procedure including the risks, benefits and alternatives for the proposed anesthesia with the patient or authorized representative who has indicated his/her understanding and acceptance.   Dental advisory given  Plan Discussed with: CRNA  Anesthesia Plan Comments:         Anesthesia Quick Evaluation

## 2017-12-21 ENCOUNTER — Encounter (HOSPITAL_COMMUNITY): Payer: Self-pay | Admitting: Cardiothoracic Surgery

## 2017-12-21 ENCOUNTER — Inpatient Hospital Stay (HOSPITAL_COMMUNITY): Payer: 59

## 2017-12-21 LAB — POCT I-STAT 3, ART BLOOD GAS (G3+)
Acid-Base Excess: 9 mmol/L — ABNORMAL HIGH (ref 0.0–2.0)
Bicarbonate: 33.6 mmol/L — ABNORMAL HIGH (ref 20.0–28.0)
O2 Saturation: 99 %
Patient temperature: 98.8
TCO2: 35 mmol/L — ABNORMAL HIGH (ref 22–32)
pCO2 arterial: 44.9 mmHg (ref 32.0–48.0)
pH, Arterial: 7.482 — ABNORMAL HIGH (ref 7.350–7.450)
pO2, Arterial: 121 mmHg — ABNORMAL HIGH (ref 83.0–108.0)

## 2017-12-21 LAB — CBC
HCT: 36.7 % — ABNORMAL LOW (ref 39.0–52.0)
HCT: 37.6 % — ABNORMAL LOW (ref 39.0–52.0)
Hemoglobin: 12.1 g/dL — ABNORMAL LOW (ref 13.0–17.0)
Hemoglobin: 12.7 g/dL — ABNORMAL LOW (ref 13.0–17.0)
MCH: 32.3 pg (ref 26.0–34.0)
MCH: 33.1 pg (ref 26.0–34.0)
MCHC: 33 g/dL (ref 30.0–36.0)
MCHC: 33.8 g/dL (ref 30.0–36.0)
MCV: 97.9 fL (ref 78.0–100.0)
MCV: 97.9 fL (ref 78.0–100.0)
PLATELETS: 302 10*3/uL (ref 150–400)
Platelets: 261 10*3/uL (ref 150–400)
RBC: 3.75 MIL/uL — AB (ref 4.22–5.81)
RBC: 3.84 MIL/uL — ABNORMAL LOW (ref 4.22–5.81)
RDW: 13.7 % (ref 11.5–15.5)
RDW: 13.5 % (ref 11.5–15.5)
WBC: 10.2 10*3/uL (ref 4.0–10.5)
WBC: 10.3 10*3/uL (ref 4.0–10.5)

## 2017-12-21 LAB — BASIC METABOLIC PANEL
Anion gap: 11 (ref 5–15)
BUN: 7 mg/dL (ref 6–20)
CO2: 28 mmol/L (ref 22–32)
Calcium: 8.7 mg/dL — ABNORMAL LOW (ref 8.9–10.3)
Chloride: 93 mmol/L — ABNORMAL LOW (ref 101–111)
Creatinine, Ser: 1.11 mg/dL (ref 0.61–1.24)
GFR calc Af Amer: >60 mL/min (ref >60)
GFR calc non Af Amer: >60 mL/min (ref >60)
Glucose, Bld: 114 mg/dL — ABNORMAL HIGH (ref 65–99)
Potassium: 3.9 mmol/L (ref 3.5–5.1)
Sodium: 132 mmol/L — ABNORMAL LOW (ref 135–145)

## 2017-12-21 LAB — GLUCOSE, CAPILLARY
Glucose-Capillary: 132 mg/dL — ABNORMAL HIGH (ref 65–99)
Glucose-Capillary: 202 mg/dL — ABNORMAL HIGH (ref 65–99)

## 2017-12-21 MED ORDER — SODIUM CHLORIDE 0.9% FLUSH: 10.0000 mL | INTRAVENOUS | Status: DC | PRN

## 2017-12-21 MED ORDER — CHLORHEXIDINE GLUCONATE CLOTH 2 % EX PADS
6.0000 | MEDICATED_PAD | Freq: Every day | CUTANEOUS | Status: DC
  Administered 2017-12-21 – 2017-12-22 (×2): 6 via TOPICAL

## 2017-12-21 MED ORDER — SODIUM CHLORIDE 0.9% FLUSH
10.0000 mL | Freq: Two times a day (BID) | INTRAVENOUS | Status: DC
  Administered 2017-12-21 – 2017-12-22 (×3): 10 mL

## 2017-12-21 MED ORDER — ALBUTEROL SULFATE (2.5 MG/3ML) 0.083% IN NEBU
2.5000 mg | INHALATION_SOLUTION | Freq: Three times a day (TID) | RESPIRATORY_TRACT | Status: DC
  Filled 2017-12-21: qty 3

## 2017-12-21 MED ORDER — ALBUTEROL SULFATE (2.5 MG/3ML) 0.083% IN NEBU: 2.5000 mg | INHALATION_SOLUTION | Freq: Four times a day (QID) | RESPIRATORY_TRACT | Status: DC | PRN

## 2017-12-21 NOTE — Progress Notes (Signed)
1 Day Post-Op Procedure(s) (LRB): VIDEO BRONCHOSCOPY (N/A) VIDEO ASSISTED THORACOSCOPY (Right) STAPLING OF BLEBS (Right) Subjective: No complaints  Objective: Vital signs in last 24 hours: Temp:  [97.4 F (36.3 C)-98.8 F (37.1 C)] 98.5 F (36.9 C) (12/29 0747) Pulse Rate:  [57-107] 59 (12/29 1000) Cardiac Rhythm: Normal sinus rhythm (12/29 0800) Resp:  [8-20] 14 (12/29 1000) BP: (100-175)/(66-99) 129/87 (12/29 1000) SpO2:  [100 %] 100 % (12/29 1000) Arterial Line BP: (75-195)/(61-104) 77/70 (12/29 1000) Weight:  [66.5 kg (146 lb 9.7 oz)] 66.5 kg (146 lb 9.7 oz) (12/29 0500)  Hemodynamic parameters for last 24 hours:    Intake/Output from previous day: 12/28 0701 - 12/29 0700 In: 3096.3 [P.O.:460; I.V.:2278.3; IV Piggyback:300] Out: 1960 [Urine:1510; Blood:100; Chest Tube:350] Intake/Output this shift: Total I/O In: 640 [P.O.:240; I.V.:400] Out: 440 [Urine:440]  General appearance: alert and cooperative Heart: regular rate and rhythm, S1, S2 normal, no murmur, click, rub or gallop Lungs: clear to auscultation bilaterally no air leak from chest tube  Lab Results: Recent Labs    12/21/17 0412 12/21/17 0515  WBC 10.2 10.3  HGB 12.1* 12.7*  HCT 36.7* 37.6*  PLT 302 261   BMET:  Recent Labs    12/20/17 0333 12/21/17 0515  NA 137 132*  K 4.1 3.9  CL 96* 93*  CO2 29 28  GLUCOSE 117* 114*  BUN 7 7  CREATININE 1.33* 1.11  CALCIUM 9.3 8.7*    PT/INR:  Recent Labs    12/19/17 1904  LABPROT 12.5  INR 0.94   ABG    Component Value Date/Time   PHART 7.482 (H) 12/21/2017 0409   HCO3 33.6 (H) 12/21/2017 0409   TCO2 35 (H) 12/21/2017 0409   O2SAT 99.0 12/21/2017 0409   CBG (last 3)  Recent Labs    12/20/17 2024 12/21/17 0042 12/21/17 0759  GLUCAP 157* 202* 132*   CXR: clear, no ptx  Assessment/Plan: S/P Procedure(s) (LRB): VIDEO BRONCHOSCOPY (N/A) VIDEO ASSISTED THORACOSCOPY (Right) STAPLING OF BLEBS (Right)  He is doing well POD 1 Chest  tube to water seal [facility] Continue ambulation and IS   LOS: 2 days    Ray [name] 12/21/2017

## 2017-12-21 NOTE — Plan of Care (Signed)
  Progressing Activity: Risk for activity intolerance will decrease 12/21/2017 0155 - Progressing by Derek Moundennis, Ethlyn Alto A, RN Note Up OOB in AM Cardiac: Hemodynamic stability will improve 12/21/2017 0155 - Progressing by Derek Moundennis, Arlone Lenhardt A, RN Note Within parameters Education: Ability to demonstrate proper wound will improve 12/21/2017 0155 - Progressing by Derek Moundennis, Johnross Nabozny A, RN Knowledge of disease or condition will improve 12/21/2017 0155 - Progressing by Derek Moundennis, Kada Friesen A, RN Knowledge of the prescribed therapeutic regimen will improve 12/21/2017 0155 - Progressing by Derek Moundennis, Kamy Poinsett A, RN Note Education ongoing Physical Regulation: Postoperative complications will be avoided or minimized 12/21/2017 0155 - Progressing by Derek Moundennis, Jaden Batchelder A, RN Respiratory: Pain level will decrease with appropriate interventions 12/21/2017 0155 - Progressing by Derek Moundennis, Charmelle Soh A, RN Note Below 5 on PCA Respiratory status will improve 12/21/2017 0155 - Progressing by Derek Moundennis, Zymire Turnbo A, RN Chest tube patency will be maintained 12/21/2017 0155 - Progressing by Derek Moundennis, Joell Usman A, RN Pain Management: Pain level will decrease 12/21/2017 0155 - Progressing by Derek Moundennis, Kevante Lunt A, RN Skin Integrity: Wound healing without signs and symptoms infection will improve 12/21/2017 0155 - Progressing by Derek Moundennis, Calum Cormier A, RN

## 2017-12-21 NOTE — Progress Notes (Signed)
Patient ID: Ray [name], male   DOB: 1961/02/06, 56 y.o.   MRN: [id number] TCTS Evening Rounds:  Hemodynamically stable  No problems today.

## 2017-12-22 ENCOUNTER — Inpatient Hospital Stay (HOSPITAL_COMMUNITY): Payer: 59

## 2017-12-22 NOTE — Progress Notes (Signed)
2 Days Post-Op Procedure(s) (LRB): VIDEO BRONCHOSCOPY (N/A) VIDEO ASSISTED THORACOSCOPY (Right) STAPLING OF BLEBS (Right) Subjective: No complaints  Objective: Vital signs in last 24 hours: Temp:  [98.5 F (36.9 C)-99.3 F (37.4 C)] 98.6 F (37 C) (12/30 0800) Pulse Rate:  [57-87] 61 (12/30 1000) Cardiac Rhythm: Normal sinus rhythm (12/30 1000) Resp:  [10-26] 15 (12/30 1000) BP: (106-158)/(69-103) 134/79 (12/30 1000) SpO2:  [95 %-100 %] 100 % (12/30 1000)  Hemodynamic parameters for last 24 hours:    Intake/Output from previous day: 12/29 0701 - 12/30 0700 In: 1339.5 [P.O.:600; I.V.:739.5] Out: 1375 [Urine:1365; Chest Tube:10] Intake/Output this shift: Total I/O In: 192.6 [P.O.:120; I.V.:72.6] Out: 40 [Chest Tube:40]  General appearance: alert and cooperative Heart: regular rate and rhythm, S1, S2 normal, no murmur, click, rub or gallop Lungs: clear to auscultation bilaterally no air leak from chest tube  Lab Results: Recent Labs    12/21/17 0412 12/21/17 0515  WBC 10.2 10.3  HGB 12.1* 12.7*  HCT 36.7* 37.6*  PLT 302 261   BMET:  Recent Labs    12/20/17 0333 12/21/17 0515  NA 137 132*  K 4.1 3.9  CL 96* 93*  CO2 29 28  GLUCOSE 117* 114*  BUN 7 7  CREATININE 1.33* 1.11  CALCIUM 9.3 8.7*    PT/INR:  Recent Labs    12/19/17 1904  LABPROT 12.5  INR 0.94   ABG    Component Value Date/Time   PHART 7.482 (H) 12/21/2017 0409   HCO3 33.6 (H) 12/21/2017 0409   TCO2 35 (H) 12/21/2017 0409   O2SAT 99.0 12/21/2017 0409   CBG (last 3)  Recent Labs    12/20/17 2024 12/21/17 0042 12/21/17 0759  GLUCAP 157* 202* 132*   CXR: clear, tiny right apical ptx unchanged  Assessment/Plan: S/P Procedure(s) (LRB): VIDEO BRONCHOSCOPY (N/A) VIDEO ASSISTED THORACOSCOPY (Right) STAPLING OF BLEBS (Right)  He is doing well. Will remove chest tube today. DC PCA, central line Transfer to 4E and continue ambulation and IS 2V CXR in am.    LOS: 3 days     Alleen BorneBryan K Kamilah [name] 12/22/2017

## 2017-12-22 NOTE — Progress Notes (Signed)
Patient ID: Ray [name], male   DOB: May 24, 1961, 56 y.o.   MRN: [id number] SICU Evening Rounds:  Stable day Chest tube out comfortable

## 2017-12-22 NOTE — Progress Notes (Signed)
0800: patient assessed 1015: Dr. Laneta SimmersBartle rounding @bedside Ray [name]/new orders placed  -d/c chest tube, central line, pca  -tx to 4E 1110: chest tube d/c'd 1200: cvc and fentanyl pca d/c'd, pt reassessed w no changes unless otherwise noted 1215: 17cc fentanyl wasted- verified by Vincenza HewsShane, RN 1600: pt reassessed; no changes unless otherwise charted

## 2017-12-23 ENCOUNTER — Inpatient Hospital Stay (HOSPITAL_COMMUNITY): Payer: 59

## 2017-12-23 LAB — GLUCOSE, CAPILLARY: Glucose-Capillary: 114 mg/dL — ABNORMAL HIGH (ref 65–99)

## 2017-12-23 MED ORDER — TRAMADOL HCL 50 MG PO TABS: 50.0000 mg | ORAL_TABLET | Freq: Four times a day (QID) | ORAL | 0 refills | Status: DC | PRN

## 2017-12-23 NOTE — Discharge Instructions (Signed)
Thoracoscopy, Care After °Refer to this sheet in the next few weeks. These instructions provide you with information about caring for yourself after your procedure. Your health care provider may also give you more specific instructions. Your treatment has been planned according to current medical practices, but problems sometimes occur. Call your health care provider if you have any problems or questions after your procedure. °What can I expect after the procedure? °After your procedure, it is common to feel sore for up to two weeks. °Follow these instructions at home: °· There are many different ways to close and cover an incision, including stitches (sutures), skin glue, and adhesive strips. Follow your health care provider's instructions about: °? Incision care. °? Bandage (dressing) changes and removal. °? Incision closure removal. °· Check your incision area every day for signs of infection. Watch for: °? Redness, swelling, or pain. °? Fluid, blood, or pus. °· Take medicines only as directed by your health care provider. °· Try to cough often. Coughing helps to protect against lung infection (pneumonia). It may hurt to cough. If this happens, hold a pillow against your chest when you cough. °· Take deep breaths. This also helps to protect against pneumonia. °· If you were given an incentive spirometer, use it as directed by your health care provider. °· Do not take baths, swim, or use a hot tub until your health care provider approves. You may take showers. °· Avoid lifting until your health care provider approves. °· Avoid driving until your health care provider approves. °· Do not travel by airplane after the chest tube is removed until your health care provider approves. °Contact a health care provider if: °· You have a fever. °· Pain medicines do not ease your pain. °· You have redness, swelling, or increasing pain in your incision area. °· You develop a cough that does not go away, or you are coughing up  mucus that is yellow or green. °Get help right away if: °· You have fluid, blood, or pus coming from your incision. °· There is a bad smell coming from your incision or dressing. °· You develop a rash. °· You have difficulty breathing. °· You cough up blood. °· You develop light-headedness or you feel faint. °· You develop chest pain. °· Your heartbeat feels irregular or very fast. °This information is not intended to replace advice given to you by your health care provider. Make sure you discuss any questions you have with your health care provider. °Document Released: 06/29/2005 Document Revised: 08/12/2016 Document Reviewed: 08/25/2014 °Elsevier Interactive Patient Education © 2018 [employer] Inc. ° °

## 2017-12-23 NOTE — Discharge Summary (Signed)
Physician Discharge Summary       [address]       [place] [id number]             [phone]    Patient ID: Ray [name] MRN: [id number] DOB/AGE: 54-Dec-1962 54 y.o.  Admit date: 12/19/2017 Discharge date: 12/23/2017  Admission Diagnoses:  Patient Active Problem List   Diagnosis Date Noted  . Lung blebs (HCC) 12/20/2017  . Pneumothorax on right 12/19/2017  . Pneumothorax 02/26/2017  . Hypertension 02/26/2017  . Nicotine dependence 02/26/2017  . Spontaneous pneumothorax 02/26/2017   Discharge Diagnoses:   Patient Active Problem List   Diagnosis Date Noted  . Lung blebs (HCC) 12/20/2017  . Pneumothorax on right 12/19/2017  . Pneumothorax 02/26/2017  . Hypertension 02/26/2017  . Nicotine dependence 02/26/2017  . Spontaneous pneumothorax 02/26/2017   Consults: None  Procedure (s):  VIDEO BRONCHOSCOPY, RIGHT VIDEO ASSISTED THORACOSCOPY, RIGHT STAPLING OF BLEBS by Dr. Turhan SageGerhardt on 12/20/2017.  History of Presenting Illness: Patient is a 56 y.o. male presents with a nonproductive cough with some chest pain.. Onset of symptoms was abrupt starting a few hours ago with gradually worsening course since that time.  The patient presented to the emergency department at [facility] was found to have a large right side pneumothorax requiring chest tube placement.  We are asked to see the patient for admission the patient is being [facility] for further management to include obtaining a CAT scan of the chest and possible video-assisted thoracoscopy.  The pain was primarily right-sided and was associated with some shortness of breath.  He has a history of both cigar and cigarette use.  In March 2016 he was found on CAT scan to have blebs bilaterally.  This is a recurrence on the right side.  Currently patients symptoms have improved some.  He continues to have some cough.  Brief Hospital Course:   Patient remained stable  overnight.  He was taken to the operating room and underwent Video Bronchoscopy and VATS with stapling of blebs.  He tolerated the procedure without difficulty, was extubated, and taken to the SICU in stable condition.  The patient did well post operatively.  His chest tube did not have an air leak and was transitioned to water seal on POD #1.  Follow up CXR showed no evidence on pneumothorax.  His chest tube was removed on POD #2.  He remained clinically stable.  Follow up CXR was again free from pneumothorax.  He was ambulating independently.  He was tolerating a diet.  His pain was well controlled.  He is medically stable for discharge home today.      Latest Vital Signs: Blood pressure 114/67, pulse 68, temperature 98.8 F (37.1 C), temperature source Oral, resp. rate 15, height 6' (1.829 m), weight 146 lb 9.7 oz (66.5 kg), SpO2 100 %.  Physical Exam: General appearance: alert, cooperative and no distress Neurologic: intact Heart: regular rate and rhythm, S1, S2 normal, no murmur, click, rub or gallop Lungs: clear to auscultation bilaterally Abdomen: soft, non-tender; bowel sounds normal; no masses,  no organomegaly Extremities: extremities normal, atraumatic, no cyanosis or edema and Homans sign is negative, no sign of DVT Wound: clean and dry  Discharge Condition: Stable and discharged to home.  Recent laboratory studies:  Lab Results  Component Value Date   WBC 10.3 12/21/2017   HGB 12.7 (L) 12/21/2017   HCT 37.6 (L) 12/21/2017   MCV 97.9 12/21/2017  PLT 261 12/21/2017   Lab Results  Component Value Date   NA 132 (L) 12/21/2017   K 3.9 12/21/2017   CL 93 (L) 12/21/2017   CO2 28 12/21/2017   CREATININE 1.11 12/21/2017   GLUCOSE 114 (H) 12/21/2017      Diagnostic Studies: Dg Chest 2 View  Result Date: 12/23/2017 CLINICAL DATA:  Follow-up right-sided pneumothorax. Interval chest tube removal. EXAM: CHEST  2 VIEW COMPARISON:  Portable chest x-ray of December 22, 2017 at  6:49 a.m. FINDINGS: The lungs are well-expanded. No residual pneumothorax on the right is observed. There is no pleural effusion. The heart and mediastinal structures are normal. The bony thorax exhibits no acute abnormality. The right internal jugular venous catheter is been removed. IMPRESSION: No residual pneumothorax on the right. No active cardiopulmonary disease. Electronically Signed   By: David  Swaz[place]and M.D.   On: 12/23/2017 07:17   Dg Chest 2 View  Result Date: 12/19/2017 CLINICAL DATA:  Cough and chest pain EXAM: CHEST  2 VIEW COMPARISON:  09/24/2017 FINDINGS: Large right pneumothorax. Slight shift of the mediastinum to the left. Nipple shadow in the right mid lung. Normal heart size. Left lung clear. IMPRESSION: Large right pneumothorax. Early tension pneumothorax. Critical Value/emergent results were called by telephone at the time of interpretation on 12/19/2017 at 11:37 am to Dr. Azalia Bilis, who verbally acknowledged these results. Electronically Signed   By: Jolaine Click M.D.   On: 12/19/2017 11:38   Ct Chest Wo Contrast  Result Date: 12/19/2017 CLINICAL DATA:  Recurrent right-sided pneumothorax status post chest tube placement. EXAM: CT CHEST WITHOUT CONTRAST TECHNIQUE: Multidetector CT imaging of the chest was performed following the standard protocol without IV contrast. COMPARISON:  Chest x-rays from same day. CT chest dated February 26, 2017. FINDINGS: Cardiovascular: No significant vascular findings. Normal heart size. No pericardial effusion. Mediastinum/Nodes: No enlarged mediastinal or axillary lymph nodes. Thyroid gland, trachea, and esophagus demonstrate no significant findings. Lungs/Pleura: Right-sided chest tube in good position. Trace residual anterior basal right pneumothorax. Right greater than left apical blebs/paraseptal emphysema is similar to prior study. Atelectasis in the right middle and lower lobes. No pleural effusion. No suspicious pulmonary nodule. Upper Abdomen: No  acute abnormality. Musculoskeletal: No chest wall abnormality. No acute or significant osseous findings. IMPRESSION: 1. Right-sided chest tube in good position with trace residual anterior basal right pneumothorax. 2. Multiple small emphysematous blebs at both lung apices are grossly unchanged. Electronically Signed   By: Obie Dredge M.D.   On: 12/19/2017 15:44   Dg Chest Port 1 View  Result Date: 12/22/2017 CLINICAL DATA:  Pneumothorax. EXAM: PORTABLE CHEST 1 VIEW COMPARISON:  Chest x-ray from yesterday. FINDINGS: Unchanged right internal jugular central venous catheter with the tip at the cavoatrial junction. Unchanged right-sided chest tube with the tip in the right lung apex. The cardiomediastinal silhouette is normal in size. Normal pulmonary vascularity. Trace right apical pneumothorax. Postsurgical changes of the right lung apex are again noted. The lungs are otherwise clear. No acute osseous abnormality. IMPRESSION: 1. Trace right apical pneumothorax. Electronically Signed   By: Obie Dredge M.D.   On: 12/22/2017 09:35   Dg Chest Port 1 View  Result Date: 12/21/2017 CLINICAL DATA:  Lung lobes.  Pneumothorax. EXAM: PORTABLE CHEST 1 VIEW COMPARISON:  Chest x-rays 12/20/2017 and CT chest 12/19/2017 FINDINGS: The heart size is normal. Lung volumes remain somewhat low. A right-sided chest tube is in place without significant pneumothorax. The right IJ line is stable with the tip  in the distal SVC. The left lung is clear. IMPRESSION: 1. Stable right-sided chest tube without pneumothorax. 2. Stable right IJ line. Electronically Signed   By: Marin Robertshristopher  Mattern M.D.   On: 12/21/2017 07:08   Dg Chest Port 1 View  Result Date: 12/20/2017 CLINICAL DATA:  Follow-up VATS EXAM: PORTABLE CHEST 1 VIEW COMPARISON:  12/20/2017 FINDINGS: Cardiac shadow is stable. Left lung is well aerated. New right chest tube is noted coursing superiorly. The previously seen small bore chest tube is been removed. Right  jugular central line is noted as well. No pneumothorax is noted. Stable Ing is noted in the upper lobe consistent with the history. No acute bony abnormality is noted. IMPRESSION: Postoperative changes without acute pneumothorax. Electronically Signed   By: Alcide CleverMark  Lukens M.D.   On: 12/20/2017 14:26   Portable Chest 1 View  Result Date: 12/20/2017 CLINICAL DATA:  Chest tube on the right for recent pneumothorax EXAM: PORTABLE CHEST 1 VIEW COMPARISON:  Chest radiograph and chest CT December 19, 2017 FINDINGS: Chest tube remains on the right. There is no pneumothorax appreciable currently. There is slight right base atelectasis. Lungs elsewhere clear. Heart size and pulmonary vascularity are normal. No adenopathy. There is degenerative change in each shoulder. IMPRESSION: Chest tube on right without demonstrable pneumothorax. Mild right base atelectasis. No edema or consolidation. Stable cardiac silhouette. Electronically Signed   By: Bretta BangWilliam  Woodruff III M.D.   On: 12/20/2017 07:20   Dg Chest Portable 1 View  Result Date: 12/19/2017 CLINICAL DATA:  Right pneumothorax. Chest pain and shortness of breath. EXAM: PORTABLE CHEST 1 VIEW 1:15 p.m. COMPARISON:  12/19/2017 at 11:30 a.m. and chest CT dated 02/26/2017 FINDINGS: The right tension pneumothorax has been completely relieved with right chest tube insertion. Bleb is noted at the right lung apex, previously demonstrated on CT scan dated 02/26/2017. There is slight atelectasis at the right lung base. Left lung is clear. Heart size and vascularity are normal. IMPRESSION: 1. Complete relief of tension pneumothorax on the right after chest tube insertion. 2. Slight residual atelectasis at the right lung base. 3. 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 Electronically Signed   By: Francene BoyersJames  Maxwell M.D.   On: 12/19/2017 13:47    Discharge Medications: Allergies as of 12/23/2017      Reactions   Asa [aspirin] Nausea Only      Medication List    TAKE these medications    bisoprolol-hydrochlorothiazide 10-6.25 MG tablet Commonly known as:  ZIAC Take 1 tablet by mouth daily.   losartan 100 MG tablet Commonly known as:  COZAAR Take 100 mg by mouth every morning.   traMADol 50 MG tablet Commonly known as:  ULTRAM Take 1 tablet (50 mg total) by mouth every 6 (six) hours as needed (mild pain).   traZODone 50 MG tablet Commonly known as:  DESYREL Take 50 mg by mouth at bedtime as needed for sleep.       Follow Up Appointments: Follow-up Information    Delight OvensGerhardt, Edward B, MD Follow up on 01/14/2018.   Specialty:  Cardiothoracic Surgery Why:  Appointment is at 3:00, please get CXR at 2:30 at [facility] located on first floor of our office building Contact information: [address] [place] [address] [phone]           Signed: Carl Bestrin BarrettPA-C 12/23/2017, 9:33 AM

## 2017-12-23 NOTE — Op Note (Signed)
NAMLangley Gauss:  Ray [name], Ray [name]               ACCOUNT NO.:  [id number]  MEDICAL RECORD NO.:  [id number]  LOCATION:                                 FACILITY:  PHYSICIAN:  Sheliah PlaneEdward Elna Radovich, MD         DATE OF BIRTH:  DATE OF PROCEDURE:  12/20/2017 DATE OF DISCHARGE:  12/23/2017                              OPERATIVE REPORT   PREOPERATIVE DIAGNOSIS:  Recurrent right pneumothorax.  POSTOPERATIVE DIAGNOSIS:  Recurrent right pneumothorax.  PROCEDURES:  Bronchoscopy, right video-assisted thoracoscopy, stapling of apical blebs x2, mechanical pleurodesis.  SURGEON:  Sheliah PlaneEdward Aviva Wolfer, MD.  FIRST ASSISTANT:  Jari Favreessa Conte, [place].  BRIEF HISTORY:  The patient is a 56 year old male who in March 2018 presented with a spontaneous right pneumothorax.  This was treated with tube thoracoscopy and quickly resolved without persistent air leak. Chest tube was removed and the patient was discharged home.  He presented again a day prior to surgery with a recurrent right spontaneous pneumothorax with some apical tenting, but approximately 50% pneumothorax with shortness of breath.  The patient presented to [facility], chest tube was placed there, and the patient was transferred to [facility] for further treatment.  CT scan of the chest was performed, demonstrated multiple blebs bilaterally, larger on the right.  Because of the second and this being a recurrent pneumothorax, we recommended to the patient that we proceed with stapling of blebs to decrease chance of recurrence.  Risks and options were discussed with the patient and he was agreeable and signed informed consent.  DESCRIPTION OF PROCEDURE:  The patient underwent general endotracheal anesthesia without incident.  Dr. Jacklynn BueMassagee placed a double-lumen endotracheal tube.  Appropriate time-out was performed and then we proceeded with video bronchoscopy through the endotracheal tube.  Then, a double-lumen endotracheal tube was in good position.   There were no endobronchial lesions appreciated to the subsegmental level bilaterally. The patient was then turned in the lateral decubitus position with the right side up.  The right side had preoperatively been marked.  The previously placed chest tube was removed.  The right chest was prepped with Betadine and draped in sterile manner.  Second time-out was performed and then we proceeded with right video-assisted thoracoscopy. We initially made a port incision in the midaxillary line, approximately 7th intercostal space.  The lung was well deflated.  A 30-degree scope was introduced into the chest.  At the apex, there were obvious blebs with associated adhesions to the apices of the chest.  We then placed separate ports anteriorly and posteriorly, and through these additional ports, we were able to manipulate the lung, and with the Bovie scissors, we were able to dissect down the apical blebs and free the apex of the chest.  There were 2 moderate sized multiloculated blebs at the apex. Once free, they were easily identifiable, and using a 45 powered Ethicon stapler through the posterior port, we were able to staple across the apical blebs and resect them.  Two firings of the stapler were required. Specimen was put in a specimen bag and extracted through the one port site.  The specimen was submitted to the pathology as apical blebs #1 right  lung.  We then continued careful exam of the lung, lower and more medial.  Along the edge adjacent to the middle lobe was a second smaller bleb.  This was also stapled and excised in a similar fashion and submitted as bleb right upper lobe #2.  With the specimens removed, we then irrigated the chest, then inflated the lung without obvious air leak.  The lung was then deflated.  Careful examination of the apex of the chest where the adhesions were taken down for any bleeders.  The areas of concern were coagulated with the Bovie scissors.  We then  used a small scratch pad and performed a mechanical pleurodesis of the right chest cavity.  A single 28 chest tube was left in the initial port site. The anterior and posterior sites were closed with interrupted 2-0 Vicryl in the deep layer and a running 3-0 Vicryl subcutaneous tissue.  Prior to complete closure, this lung was reinflated.  The skin edges were closed with a running 4-0 Vicryl and Dermabond was placed over the port sites.  Chest tube was secured in place.  There was no air leak at the completion of the procedure.  Estimated blood loss 150 mL.  The patient tolerated the procedure without obvious complication.  He was extubated in the operating room and transferred to the recovery room for further postoperative care.     Sheliah PlaneEdward Kristol Almanzar, MD     EG/MEDQ  D:  12/22/2017  T:  12/23/2017  Job:  [id number]

## 2017-12-23 NOTE — Progress Notes (Signed)
Patient ID: Ray [name], male   DOB: [birth year], 56 y.o.   MRN: [id number] TCTS DAILY ICU PROGRESS NOTE                   [address]            [place] [id number]          [id number]   3 Days Post-Op Procedure(s) (LRB): VIDEO BRONCHOSCOPY (N/A) VIDEO ASSISTED THORACOSCOPY (Right) STAPLING OF BLEBS (Right)  Total Length of Stay:  LOS: 4 days   Subjective: Feels well today, still waiting for 4e bed  Objective: Vital signs in last 24 hours: Temp:  [98.5 F (36.9 C)-99.2 F (37.3 C)] 99.2 F (37.3 C) (12/31 0400) Pulse Rate:  [57-87] 68 (12/31 0500) Cardiac Rhythm: Normal sinus rhythm (12/31 0400) Resp:  [11-28] 15 (12/31 0500) BP: (105-153)/(67-103) 114/67 (12/31 0400) SpO2:  [95 %-100 %] 100 % (12/31 0500)  Filed Weights   12/19/17 1112 12/21/17 0500  Weight: 162 lb (73.5 kg) 146 lb 9.7 oz (66.5 kg)    Weight change:    Hemodynamic parameters for last 24 hours:    Intake/Output from previous day: 12/30 0701 - 12/31 0700 In: 192.6 [P.O.:120; I.V.:72.6] Out: 40 [Chest Tube:40]  Intake/Output this shift: No intake/output data recorded.  Current Meds: Scheduled Meds: . bisoprolol-hydrochlorothiazide  1 tablet Oral Daily  . losartan  100 mg Oral Daily  . senna-docusate  1 tablet Oral QHS   Continuous Infusions: PRN Meds:.oxyCODONE, traMADol  General appearance: alert, cooperative and no distress Neurologic: intact Heart: regular rate and rhythm, S1, S2 normal, no murmur, click, rub or gallop Lungs: clear to auscultation bilaterally Abdomen: soft, non-tender; bowel sounds normal; no masses,  no organomegaly Extremities: extremities normal, atraumatic, no cyanosis or edema and Homans sign is negative, no sign of DVT Wound: ct sites intact  Lab Results: CBC: Recent Labs    12/21/17 0412 12/21/17 0515  WBC 10.2 10.3  HGB 12.1* 12.7*  HCT 36.7* 37.6*  PLT 302 261   BMET:  Recent Labs    12/21/17 0515  NA 132*  K 3.9  CL 93*    CO2 28  GLUCOSE 114*  BUN 7  CREATININE 1.11  CALCIUM 8.7*    CMET: Lab Results  Component Value Date   WBC 10.3 12/21/2017   HGB 12.7 (L) 12/21/2017   HCT 37.6 (L) 12/21/2017   PLT 261 12/21/2017   GLUCOSE 114 (H) 12/21/2017   ALT 61 12/19/2017   AST 84 (H) 12/19/2017   NA 132 (L) 12/21/2017   K 3.9 12/21/2017   CL 93 (L) 12/21/2017   CREATININE 1.11 12/21/2017   BUN 7 12/21/2017   CO2 28 12/21/2017   INR 0.94 12/19/2017      PT/INR: No results for input(s): LABPROT, INR in the last 72 hours. Radiology: Dg Chest 2 View  Result Date: 12/23/2017 CLINICAL DATA:  Follow-up right-sided pneumothorax. Interval chest tube removal. EXAM: CHEST  2 VIEW COMPARISON:  Portable chest x-ray of December 22, 2017 at 6:49 a.m. FINDINGS: The lungs are well-expanded. No residual pneumothorax on the right is observed. There is no pleural effusion. The heart and mediastinal structures are normal. The bony thorax exhibits no acute abnormality. The right internal jugular venous catheter is been removed. IMPRESSION: No residual pneumothorax on the right. No active cardiopulmonary disease. Electronically Signed   By: David  SwazilandJordan M.D.   On: 12/23/2017 07:17   I have independently reviewed the above radiology  studies  and reviewed the findings with the patient.   Assessment/Plan: S/P Procedure(s) (LRB): VIDEO BRONCHOSCOPY (N/A) VIDEO ASSISTED THORACOSCOPY (Right) STAPLING OF BLEBS (Right) Chest xray ok this am plan d/c home today  No lifting over 25 lbs for 2 weeks  Plan follow-up chest x-ray in the office 2-3 weeks    Delight Ovensdward B Pauline Trainer 12/23/2017 7:32 AM

## 2018-01-03 ENCOUNTER — Other Ambulatory Visit: Payer: Self-pay | Admitting: Cardiothoracic Surgery

## 2018-01-03 DIAGNOSIS — Z736 Limitation of activities due to disability: Secondary | ICD-10-CM

## 2018-01-13 ENCOUNTER — Other Ambulatory Visit: Payer: Self-pay | Admitting: Cardiothoracic Surgery

## 2018-01-13 DIAGNOSIS — J9383 Other pneumothorax: Secondary | ICD-10-CM

## 2018-01-14 ENCOUNTER — Ambulatory Visit (INDEPENDENT_AMBULATORY_CARE_PROVIDER_SITE_OTHER): Payer: Self-pay | Admitting: Physician Assistant

## 2018-01-14 ENCOUNTER — Ambulatory Visit
Admission: RE | Admit: 2018-01-14 | Discharge: 2018-01-14 | Disposition: A | Discharge status: Home / Self Care | Payer: 59 | Source: Ambulatory Visit | Attending: Cardiothoracic Surgery | Admitting: Cardiothoracic Surgery

## 2018-01-14 VITALS — BP 134/82 | HR 69 | Resp 20 | Ht 72.0 in | Wt 152.0 lb

## 2018-01-14 DIAGNOSIS — Z09 Encounter for follow-up examination after completed treatment for conditions other than malignant neoplasm: Secondary | ICD-10-CM

## 2018-01-14 DIAGNOSIS — J9383 Other pneumothorax: Secondary | ICD-10-CM

## 2018-01-14 DIAGNOSIS — J939 Pneumothorax, unspecified: Secondary | ICD-10-CM

## 2018-01-14 NOTE — Patient Instructions (Signed)
Follow-up as needed. Return to work in 12 weeks Try to maintain a healthy diet and exercise routine. Stick to a 5 pound weight limit for the next couple of weeks and then slowly increase. He may drive at this time since you are no longer requiring narcotic pain medication. Toradol prescription was provided today. Please call if you have any questions or concerns.

## 2018-01-14 NOTE — Progress Notes (Signed)
Ray [name] is a 57 y.o. male patient who presents today for his routine follow-up visit status post video bronch, right Vats, and right stapling of blebs by Dr. Yisrael SageGerhardt.  He had an uncomplicated course in the hospital.  He was discharged postop day 3.   1. Pneumothorax on right   2. Surgery follow-up examination    Past Medical History:  Diagnosis Date  . Atelectasis    r/side  . Hypertension    No past surgical history pertinent negatives on file. Scheduled Meds: Current Outpatient Medications on File Prior to Visit  Medication Sig Dispense Refill  . bisoprolol-hydrochlorothiazide (ZIAC) 10-6.25 MG per tablet Take 1 tablet by mouth daily. 30 tablet 0  . losartan (COZAAR) 100 MG tablet Take 100 mg by mouth every morning.  1  . traZODone (DESYREL) 50 MG tablet Take 50 mg by mouth at bedtime as needed for sleep.   4  . traMADol (ULTRAM) 50 MG tablet Take 1 tablet (50 mg total) by mouth every 6 (six) hours as needed (mild pain). (Patient not taking: Reported on 01/14/2018) 30 tablet 0   No current facility-administered medications on file prior to visit.     Allergies  Allergen Reactions  . Asa [Aspirin] Nausea Only   Active Problems:   * No active hospital problems. *  Blood pressure 134/82, pulse 69, resp. rate 20, height 6' (1.829 m), weight 152 lb (68.9 kg), SpO2 98 %.  Subjective   Objective  Cor: Regular rate and rhythm, no murmur Pulmonary: Clear to auscultation bilaterally in all fields Abdominal: No tenderness Wound: Well-healed.  There was a suture remaining which I removed. Extremity: No  edema   CLINICAL DATA:  Follow-up of right-sided pneumothorax. History of video-assisted thoracic surgery on December 20, 2017 with stapling of blebs.  EXAM: CHEST  2 VIEW  COMPARISON:  Chest x-ray of December 23, 2017  FINDINGS: The lungs are well-expanded. No pneumothorax is seen. The staple line is visible in the medial aspect of the right upper lobe. There is  no alveolar infiltrate or pleural effusion. The heart and mediastinal structures are normal. The bony structures are unremarkable.  IMPRESSION: There is no persistent or recurrent pneumothorax. There is no active cardiopulmonary disease.   Electronically Signed   By: David  SwazilandJordan M.D.   On: 01/14/2018 14:54  Assessment & Plan  Ray [name] returns today for his follow-up visit status post VATS with stapling of blebs.  Overall he is doing quite well.  He is having some incisional pain especially at night.  He cannot take the Ultram due to nausea and vomiting.  He admitted to picking up his grand daughter whom is most certainly more than 5 pounds.  Around his incisions there was some fullness.  I discussed sticking to the 5 pound weight limit for his upper extremity for the next few weeks.  He is anxious to return to work, however his work requires heavy lifting therefore 12 weeks was recommended.  The patient already has a return to work note.  Overall, he is progressing quite nicely.  He does not have any shortness of breath.  I reviewed the chest x-ray with him and his wife at the bedside.  All questions were answered to the patient's satisfaction.  I provided him with a prescription for Toradol today 10 mg oral as needed at bedtime x5 tabs.  He is to call our office if he has a problem with medication.  I discussed renal function with the patient  and reminded him to drink plenty of water with the medication.  His last creatinine was 1.1.  I do not think that 5 tablets should affect renal function.  He is to call our office if the medication does not relieve his pain so that we can come up with another solution for him.  He may drive at this time since he is no longer requiring narcotic pain medication.  We discussed smoking cessation which he has quit smoking cigarettes several weeks ago.  He may follow-up with our office as needed.  Sharlene Dory 01/14/2018

## 2018-03-19 ENCOUNTER — Other Ambulatory Visit: Payer: Self-pay | Admitting: Family Medicine

## 2018-03-19 ENCOUNTER — Ambulatory Visit
Admission: RE | Admit: 2018-03-19 | Discharge: 2018-03-19 | Disposition: A | Discharge status: Home / Self Care | Payer: 59 | Source: Ambulatory Visit | Attending: Family Medicine | Admitting: Family Medicine

## 2018-03-19 DIAGNOSIS — J939 Pneumothorax, unspecified: Secondary | ICD-10-CM

## 2018-05-28 ENCOUNTER — Other Ambulatory Visit: Payer: Self-pay | Admitting: Family Medicine

## 2018-05-28 ENCOUNTER — Ambulatory Visit
Admission: RE | Admit: 2018-05-28 | Discharge: 2018-05-28 | Disposition: A | Discharge status: Home / Self Care | Payer: 59 | Source: Ambulatory Visit | Attending: Family Medicine | Admitting: Family Medicine

## 2018-05-28 DIAGNOSIS — Z8709 Personal history of other diseases of the respiratory system: Secondary | ICD-10-CM

## 2018-05-28 DIAGNOSIS — R0602 Shortness of breath: Secondary | ICD-10-CM

## 2018-06-04 ENCOUNTER — Ambulatory Visit
Admission: RE | Admit: 2018-06-04 | Discharge: 2018-06-04 | Disposition: A | Discharge status: Home / Self Care | Payer: 59 | Source: Ambulatory Visit | Attending: Family Medicine | Admitting: Family Medicine

## 2018-06-04 ENCOUNTER — Other Ambulatory Visit: Payer: Self-pay | Admitting: Family Medicine

## 2018-06-04 DIAGNOSIS — R911 Solitary pulmonary nodule: Secondary | ICD-10-CM

## 2018-08-21 ENCOUNTER — Ambulatory Visit
Admission: RE | Admit: 2018-08-21 | Discharge: 2018-08-21 | Disposition: A | Discharge status: Home / Self Care | Payer: 59 | Source: Ambulatory Visit | Attending: Family Medicine | Admitting: Family Medicine

## 2018-08-21 ENCOUNTER — Other Ambulatory Visit: Payer: Self-pay | Admitting: Family Medicine

## 2018-08-21 DIAGNOSIS — J9811 Atelectasis: Secondary | ICD-10-CM

## 2018-09-02 ENCOUNTER — Other Ambulatory Visit: Payer: Self-pay | Admitting: Family Medicine

## 2018-09-02 DIAGNOSIS — R110 Nausea: Secondary | ICD-10-CM

## 2018-09-02 DIAGNOSIS — R52 Pain, unspecified: Secondary | ICD-10-CM

## 2018-09-02 DIAGNOSIS — R140 Abdominal distension (gaseous): Secondary | ICD-10-CM

## 2018-09-09 ENCOUNTER — Other Ambulatory Visit: Payer: 59

## 2018-09-11 ENCOUNTER — Emergency Department (HOSPITAL_COMMUNITY): Payer: Self-pay

## 2018-09-11 ENCOUNTER — Emergency Department (HOSPITAL_COMMUNITY)
Admission: EM | Admit: 2018-09-11 | Discharge: 2018-09-11 | Disposition: A | Discharge status: Home / Self Care | Payer: Self-pay | Attending: Emergency Medicine | Admitting: Emergency Medicine

## 2018-09-11 ENCOUNTER — Encounter (HOSPITAL_COMMUNITY): Payer: Self-pay

## 2018-09-11 DIAGNOSIS — F1290 Cannabis use, unspecified, uncomplicated: Secondary | ICD-10-CM | POA: Insufficient documentation

## 2018-09-11 DIAGNOSIS — R1011 Right upper quadrant pain: Secondary | ICD-10-CM | POA: Insufficient documentation

## 2018-09-11 DIAGNOSIS — R748 Abnormal levels of other serum enzymes: Secondary | ICD-10-CM | POA: Insufficient documentation

## 2018-09-11 DIAGNOSIS — Z87891 Personal history of nicotine dependence: Secondary | ICD-10-CM | POA: Insufficient documentation

## 2018-09-11 DIAGNOSIS — E8809 Other disorders of plasma-protein metabolism, not elsewhere classified: Secondary | ICD-10-CM | POA: Insufficient documentation

## 2018-09-11 LAB — CBC
HCT: 43.7 % (ref 39.0–52.0)
Hemoglobin: 14.8 g/dL (ref 13.0–17.0)
MCH: 33.5 pg (ref 26.0–34.0)
MCHC: 33.9 g/dL (ref 30.0–36.0)
MCV: 98.9 fL (ref 78.0–100.0)
PLATELETS: 340 10*3/uL (ref 150–400)
RBC: 4.42 MIL/uL (ref 4.22–5.81)
RDW: 12.8 % (ref 11.5–15.5)
WBC: 4.9 10*3/uL (ref 4.0–10.5)

## 2018-09-11 LAB — COMPREHENSIVE METABOLIC PANEL
ALK PHOS: 58 U/L (ref 38–126)
ALT: 61 U/L — AB (ref 0–44)
AST: 87 U/L — AB (ref 15–41)
Albumin: 5.3 g/dL — ABNORMAL HIGH (ref 3.5–5.0)
Anion gap: 12 (ref 5–15)
BILIRUBIN TOTAL: 1 mg/dL (ref 0.3–1.2)
BUN: 14 mg/dL (ref 6–20)
CALCIUM: 10.2 mg/dL (ref 8.9–10.3)
CHLORIDE: 95 mmol/L — AB (ref 98–111)
CO2: 30 mmol/L (ref 22–32)
CREATININE: 1.19 mg/dL (ref 0.61–1.24)
GFR calc Af Amer: >60 mL/min (ref >60)
Glucose, Bld: 111 mg/dL — ABNORMAL HIGH (ref 70–99)
Potassium: 3.9 mmol/L (ref 3.5–5.1)
Sodium: 137 mmol/L (ref 135–145)
TOTAL PROTEIN: 9.1 g/dL — AB (ref 6.5–8.1)

## 2018-09-11 LAB — URINALYSIS, ROUTINE W REFLEX MICROSCOPIC
Bilirubin Urine: NEGATIVE
GLUCOSE, UA: NEGATIVE mg/dL
Hgb urine dipstick: NEGATIVE
KETONES UR: 5 mg/dL — AB
LEUKOCYTES UA: NEGATIVE
Nitrite: NEGATIVE
PROTEIN: NEGATIVE mg/dL
Specific Gravity, Urine: 1.011 (ref 1.005–1.030)
pH: 6 (ref 5.0–8.0)

## 2018-09-11 LAB — LIPASE, BLOOD: Lipase: 63 U/L — ABNORMAL HIGH (ref 11–51)

## 2018-09-11 MED ORDER — LIDOCAINE VISCOUS HCL 2 % MT SOLN
15.0000 mL | Freq: Once | OROMUCOSAL | Status: AC
  Administered 2018-09-11: 15 mL via OROMUCOSAL
  Filled 2018-09-11: qty 15

## 2018-09-11 MED ORDER — DICYCLOMINE HCL 20 MG PO TABS: 20.0000 mg | ORAL_TABLET | Freq: Two times a day (BID) | ORAL | 0 refills | Status: DC

## 2018-09-11 MED ORDER — PANTOPRAZOLE SODIUM 20 MG PO TBEC: 20.0000 mg | DELAYED_RELEASE_TABLET | Freq: Every day | ORAL | 0 refills | Status: DC

## 2018-09-11 MED ORDER — SUCRALFATE 1 G PO TABS: 1.0000 g | ORAL_TABLET | Freq: Three times a day (TID) | ORAL | 0 refills | Status: DC

## 2018-09-11 MED ORDER — ALUM & MAG HYDROXIDE-SIMETH 200-200-20 MG/5ML PO SUSP
15.0000 mL | Freq: Once | ORAL | Status: AC
  Administered 2018-09-11: 15 mL via ORAL
  Filled 2018-09-11: qty 30

## 2018-09-11 NOTE — ED Notes (Signed)
I- Stat Troponin 0.01

## 2018-09-11 NOTE — ED Provider Notes (Signed)
[facility]-EMERGENCY DEPT Provider Note   CSN: [id number] Arrival date & time: 09/11/18  1430     History   Chief Complaint Chief Complaint  Patient presents with  . Abdominal Pain    HPI Ray [name] is a 57 y.o. male.  HPI   Patient is a 57 year old male with a history of pulmonary blood status post VATS surgery in January 2019, hypertension, and daily alcohol use presenting for right upper quadrant pain.  Patient reports that he feels pain up under his ribs daily.  Patient reports symptoms have occurred for approximately 1 month.  Patient reports that he discuss with his primary care provider, but was unable to get an appointment for a couple weeks and has an ultrasound scheduled for next week.  Patient reports that the pain will come in waves, is worse after eating, but is also worse exertionally.  Patient reports that the pain radiates to his right shoulder.  Patient denies shortness of breath with this pain.  Patient denies any history of CAD, MI, but does report remote smoking history.  No family history of early MI.  Patient denies any recent immobilization, hospitalization, hormone use, recent surgery, hemoptysis, coughing, history of DVT/PE, lower extremity edema or tenderness.  No fever or chills.  Patient denies nausea, vomiting, hematemesis, melena or hematochezia.  Past Medical History:  Diagnosis Date  . Atelectasis    r/side  . Hypertension     Patient Active Problem List   Diagnosis Date Noted  . Lung blebs (HCC) 12/20/2017  . Pneumothorax on right 12/19/2017  . Pneumothorax 02/26/2017  . Hypertension 02/26/2017  . Nicotine dependence 02/26/2017  . Spontaneous pneumothorax 02/26/2017    Past Surgical History:  Procedure Laterality Date  . STAPLING OF BLEBS Right 12/20/2017   Procedure: STAPLING OF BLEBS;  Surgeon: Delight Ovens, MD;  Location: [facility] OR;  Service: Thoracic;  Laterality: Right;  Marland Kitchen VIDEO ASSISTED THORACOSCOPY Right  12/20/2017   Procedure: VIDEO ASSISTED THORACOSCOPY;  Surgeon: Delight Ovens, MD;  Location: [facility] OR;  Service: Thoracic;  Laterality: Right;  Marland Kitchen VIDEO BRONCHOSCOPY N/A 12/20/2017   Procedure: VIDEO BRONCHOSCOPY;  Surgeon: Delight Ovens, MD;  Location: [facility] OR;  Service: Thoracic;  Laterality: N/A;        Home Medications    Prior to Admission medications   Medication Sig Start Date End Date Taking? Authorizing Provider  bisoprolol-hydrochlorothiazide (ZIAC) 10-6.25 MG per tablet Take 1 tablet by mouth daily. 12/23/13  Yes Nelva Nay, MD  losartan (COZAAR) 100 MG tablet Take 100 mg by mouth every morning. 02/16/17  Yes [provider]  traZODone (DESYREL) 50 MG tablet Take 50 mg by mouth at bedtime as needed for sleep.  01/29/17  Yes [provider]  traMADol (ULTRAM) 50 MG tablet Take 1 tablet (50 mg total) by mouth every 6 (six) hours as needed (mild pain). Patient not taking: Reported on 01/14/2018 12/23/17   Barrett, Rae Roam, PA-C    Family History Family History  Problem Relation Age of Onset  . Hypertension Mother     Social History Social History   Tobacco Use  . Smoking status: Former Games developer  . Smokeless tobacco: Never Used  Substance Use Topics  . Alcohol use: Yes    Comment: pt reports 1 beer daily  . Drug use: Yes    Types: Marijuana     Allergies   Asa [aspirin]   Review of Systems Review of Systems  Constitutional: Negative for chills and  fever.  HENT: Negative for congestion, rhinorrhea, sinus pain and sore throat.   Respiratory: Negative for cough, chest tightness and shortness of breath.   Cardiovascular: Negative for chest pain, palpitations and leg swelling.  Gastrointestinal: Positive for abdominal pain. Negative for blood in stool, diarrhea, nausea and vomiting.  Genitourinary: Negative for dysuria, flank pain and hematuria.  Musculoskeletal: Positive for arthralgias. Negative for back pain and myalgias.  Skin: Negative  for rash.  Allergic/Immunologic: Negative for immunocompromised state.  Neurological: Negative for dizziness, syncope, light-headedness and headaches.     Physical Exam Updated Vital Signs BP 130/70 (BP Location: Left Arm)   Pulse 98   Temp 99.1 F (37.3 C) (Oral)   Resp 16   Ht 6' (1.829 m)   Wt 70.3 kg   SpO2 100%   BMI 21.02 kg/m   Physical Exam  Constitutional: He appears well-developed and well-nourished. No distress.  HENT:  Head: Normocephalic and atraumatic.  Mouth/Throat: Oropharynx is clear and moist.  Eyes: Pupils are equal, round, and reactive to light. Conjunctivae and EOM are normal.  Neck: Normal range of motion. Neck supple.  Cardiovascular: Normal rate, regular rhythm, S1 normal and S2 normal.  No murmur heard. Pulmonary/Chest: Effort normal and breath sounds normal. He has no wheezes. He has no rales.  Abdominal: Soft. He exhibits no distension. There is tenderness in the right upper quadrant. There is no guarding and negative Murphy's sign.  Musculoskeletal: Normal range of motion. He exhibits no edema or deformity.  Lymphadenopathy:    He has no cervical adenopathy.  Neurological: He is alert.  Cranial nerves grossly intact. Patient moves extremities symmetrically and with good coordination.  Skin: Skin is warm and dry. No rash noted. No erythema.  Psychiatric: He has a normal mood and affect. His behavior is normal. Judgment and thought content normal.  Nursing note and vitals reviewed.    ED Treatments / Results  Labs (all labs ordered are listed, but only abnormal results are displayed) Labs Reviewed  LIPASE, BLOOD - Abnormal; Notable for the following components:      Result Value   Lipase 63 (*)    All other components within normal limits  COMPREHENSIVE METABOLIC PANEL - Abnormal; Notable for the following components:   Chloride 95 (*)    Glucose, Bld 111 (*)    Total Protein 9.1 (*)    Albumin 5.3 (*)    AST 87 (*)    ALT 61 (*)     All other components within normal limits  URINALYSIS, ROUTINE W REFLEX MICROSCOPIC - Abnormal; Notable for the following components:   Ketones, ur 5 (*)    All other components within normal limits  CBC  I-STAT TROPONIN, ED    EKG EKG Interpretation  Date/Time:  Thursday September 11 2018 17:12:07 EDT Ventricular Rate:  87 PR Interval:    QRS Duration: 87 QT Interval:  345 QTC Calculation: 415 R Axis:   71 Text Interpretation:  Sinus rhythm Confirmed by Loren RacerYelverton, David ([id number]) on 09/11/2018 6:46:45 PM   Radiology Dg Chest 2 View  Result Date: 09/11/2018 CLINICAL DATA:  Chest pain EXAM: CHEST - 2 VIEW COMPARISON:  August 21, 2018 FINDINGS: There is no edema or consolidation. Postoperative changes noted in the right apex. Heart size and pulmonary vascularity are normal. No adenopathy. No pneumothorax. No bone lesions. IMPRESSION: Postoperative change right apex. No edema or consolidation. Stable cardiac silhouette. Electronically Signed   By: Bretta BangWilliam  Woodruff III M.D.   On: 09/11/2018 15:58  US Abdomen Limited Ruq  Result Date: 09/11/2018 CLINICAL DATA:  Right upper quadrant pain EXAM: ULTRASOUND ABDOMEN LIMITED RIGHT UPPER QUADRANT COMPARISON:  Abdomen films of 12/23/2013 FINDINGS: Gallbladder: The gallbladder is visualized and no gallstones are noted. There is no pain over the gallbladder with compression. Common bile duct: Diameter: The common bile duct is normal measuring 3.2 mm in diameter. Liver: The parenchyma of the liver is somewhat echogenic suggesting hepatic steatosis. No focal hepatic abnormality is seen. Portal vein is patent on color Doppler imaging with normal direction of blood flow towards the liver. IMPRESSION: 1. Echogenic liver parenchyma suggesting hepatic steatosis. No focal abnormality. 2. No gallstones. Electronically Signed   By: Dwyane Dee M.D.   On: 09/11/2018 17:12    Procedures Procedures (including critical care time)  Medications Ordered in  ED Medications  alum & mag hydroxide-simeth (MAALOX/MYLANTA) 200-200-20 MG/5ML suspension 15 mL (15 mLs Oral Given 09/11/18 1708)  lidocaine (XYLOCAINE) 2 % viscous mouth solution 15 mL (15 mLs Mouth/Throat Given 09/11/18 1708)     Initial Impression / Assessment and Plan / ED Course  I have reviewed the triage vital signs and the nursing notes.  Pertinent labs & imaging results that were available during my care of the patient were reviewed by me and considered in my medical decision making (see chart for details).  Clinical Course as of Sep 12 1839  Thu Sep 11, 2018  1610 Not suggestive of acute pancreatitis. Likely 2/2 not eating.   Lipase(!): 63 [AM]  1835 Needs OP follow up. No evidence of renal insufficiency. Will discuss with patient.   Total Protein(!): 9.1 [AM]  1838 May be 2/2 heavy alcohol use.   AST(!): 87 [AM]    Clinical Course User Index [AM] Aviva Kluver B, PA-C    Patient nontoxic-appearing, afebrile, no acute distress.  Nonsurgical abdomen.  Differential diagnosis includes biliary colic, gallstones, cholecystitis, ACS, pulmonary embolism, peptic ulcer disease.  Patient's work-up overall reassuring.  EKG without acute ischemic changes.  Patient's troponin is negative.  This is a very atypical chest pain picture, will refer to primary care provider regarding further cardiac work-up.  This did not crossover in the chart, however nurse tech placed a note, and I personally looked at the results of the lab.  Patient's right upper quadrant ultrasound demonstrates hepatic steatosis, but no gallstones or gallbladder wall thickening.  Patient exhibits hyperproteinemia and elevated liver enzymes.  Unclear etiology of hyperproteinemia, but elevated liver enzymes could be related to hepatic steatosis versus alcohol use.  I discussed with patient, and due to right upper quadrant pain, and his liver enzymes, will refer to gastroenterology.  Patient can return precautions for any  worsening pain, chest pain or shortness of breath, fever chills abdominal pain, intractable nausea or vomiting.  Patient is in understanding and agrees with the plan of care.  This is a supervised visit with Dr. Loren Racer. Evaluation, management, and discharge planning discussed with this attending physician.  Final Clinical Impressions(s) / ED Diagnoses   Final diagnoses:  RUQ pain  Hyperproteinemia  Elevated liver enzymes    ED Discharge Orders         Ordered    pantoprazole (PROTONIX) 20 MG tablet  Daily     09/11/18 2026    dicyclomine (BENTYL) 20 MG tablet  2 times daily     09/11/18 2026    sucralfate (CARAFATE) 1 g tablet  3 times daily with meals & bedtime     09/11/18 2026  Ambulatory referral to Gastroenterology     09/11/18 2026           Delia Chimes 09/11/18 2029    Loren Racer, MD 09/12/18 [phone]

## 2018-09-11 NOTE — Discharge Instructions (Addendum)
Please see the information and instructions below regarding your visit.  Your diagnoses today include:  1. RUQ pain   2. Hyperproteinemia   3. Elevated liver enzymes     Tests performed today include: See side panel of your discharge paperwork for testing performed today. Vital signs are listed at the bottom of these instructions.   You have elevated protein in your blood.  This is nonspecific, but needs to be worked up further by primary care provider.  Your liver enzymes are slightly elevated today.  This could be due to either alcohol, or fatty impression of the liver.  I placed a referral to gastroneurology.  You are also referred to gastroneurology for further work-up of your gallbladder.  He did not have stones today.  Medications prescribed:    Take any prescribed medications only as prescribed, and any over the counter medications only as directed on the packaging.  Please start taking a medicine called Carafate or sucralfate.  This can be taken up to 4 times a day, with meals and before bedtime.  Please do not take your proton pump inhibitor (Protonix, Nexium, Prilosec) within 30 minutes of taking Carafate.  Please start taking Protonix, 20 mg daily please see gastro enterology.  Please take Bentyl twice daily as needed for abdominal spasming.  Home care instructions:  Please follow any educational materials contained in this packet.   Follow-up instructions: Please follow-up with your primary care provider in 2 weeks for further cardiac workup including stress test as he deems fit, and recheck of the elevated protein in your blood.   Please follow up with gastroenterology. They should be calling you.   Return instructions:  Please return to the Emergency Department if you experience worsening symptoms.  Please return the emergency department for any worsening pain, chest pain shortness breath, fever chills, nausea vomiting prevention from keeping anything down. Please  return if you have any other emergent concerns.  Additional Information:   Your vital signs today were: BP (!) 147/83 (BP Location: Left Arm)    Pulse 72    Temp 99.1 F (37.3 C) (Oral)    Resp 20    Ht 6' (1.829 m)    Wt 70.3 kg    SpO2 100%    BMI 21.02 kg/m  If your blood pressure (BP) was elevated on multiple readings during this visit above 130 for the top number or above 80 for the bottom number, please have this repeated by your primary care provider within one month. --------------  Thank you for allowing us to participate in your care today.

## 2018-09-11 NOTE — ED Triage Notes (Signed)
Pt presents with c/o abdominal and side pain right under his right rib cage. Pt has been followed by his doctor but his most recent appt was cancelled as he reports he was a few minutes late for his appt.

## 2018-09-12 ENCOUNTER — Encounter: Payer: Self-pay | Admitting: Gastroenterology

## 2018-09-12 LAB — I-STAT TROPONIN, ED: Troponin i, poc: 0.01 ng/mL (ref 0.00–0.08)

## 2018-09-17 ENCOUNTER — Other Ambulatory Visit: Payer: 59

## 2018-10-16 ENCOUNTER — Encounter: Payer: Self-pay | Admitting: Gastroenterology

## 2018-10-16 ENCOUNTER — Ambulatory Visit (INDEPENDENT_AMBULATORY_CARE_PROVIDER_SITE_OTHER): Payer: 59 | Admitting: Gastroenterology

## 2018-10-16 ENCOUNTER — Ambulatory Visit: Payer: 59 | Admitting: Gastroenterology

## 2018-10-16 VITALS — BP 118/72 | HR 78 | Ht 72.0 in | Wt 151.2 lb

## 2018-10-16 DIAGNOSIS — K760 Fatty (change of) liver, not elsewhere classified: Secondary | ICD-10-CM

## 2018-10-16 DIAGNOSIS — R1011 Right upper quadrant pain: Secondary | ICD-10-CM

## 2018-10-16 MED ORDER — PANTOPRAZOLE SODIUM 40 MG PO TBEC: 40.0000 mg | DELAYED_RELEASE_TABLET | Freq: Every day | ORAL | 3 refills | Status: DC

## 2018-10-16 NOTE — Progress Notes (Signed)
Chief Complaint:   Referring Provider:  Elisha Ponder, PA-C      ASSESSMENT AND PLAN;   #1. RUQ pain (neg [place] 08/2018 for etiology). D/d PUD, GERD, gastritis, nonulcer dyspepsia, gastroparesis, musculoskeletal etiology, r/o gallbladder or pancreatic problems.  #2. Abnormal LFTs d/t fatty liver (Dx on [place] 08/2018) likely due to alcohol use.  Plan: - Proceed with EGD - Protonix 40mg  po qd. - If above work-up is negative and he still has problems, would obtain CT abdo/pelvis, followed by HIDA with EF. - Stop drinking all alcohol. - Repeat liver function tests, lipase in 8 to 12 weeks off alcohol.  If liver function tests are still elevated, then would perform further work-up.   HPI:    Ray [name] is a 57 y.o. 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 Nonradiating, mild, more or less constant Associated with some heartburn but no nausea or vomiting Seen in the emergency room yesterday -had normal CBC, CMP showed elevated liver function tests as below, mildly elevated lipase.  Right upper quadrant ultrasound showed fatty liver.  Normal gallbladder. Seen in the GI clinic as an emergency work in Does admit that he has been drinking beer and tequila for last several years.  Heavy over the weekends No nonsteroidals No fever or chills No jaundice or dark urine No previous history of pancreatitis. No weight loss.   Had screening colonoscopy colon 2014 at the age of 47 which was negative.  Told to get repeat colonoscopy in 10 years. Past Medical History:  Diagnosis Date  . Atelectasis    r/side  . Hypertension     Past Surgical History:  Procedure Laterality Date  . STAPLING OF BLEBS Right 12/20/2017   Procedure: STAPLING OF BLEBS;  Surgeon: Delight Ovens, MD;  Location: [facility] OR;  Service: Thoracic;  Laterality: Right;  Marland Kitchen VIDEO ASSISTED THORACOSCOPY Right  12/20/2017   Procedure: VIDEO ASSISTED THORACOSCOPY;  Surgeon: Delight Ovens, MD;  Location: [facility] OR;  Service: Thoracic;  Laterality: Right;  Marland Kitchen VIDEO BRONCHOSCOPY N/A 12/20/2017   Procedure: VIDEO BRONCHOSCOPY;  Surgeon: Delight Ovens, MD;  Location: [facility] OR;  Service: Thoracic;  Laterality: N/A;    Family History  Problem Relation Age of Onset  . Hypertension Mother   . Colon cancer Neg Hx   . Esophageal cancer Neg Hx     Social History   Tobacco Use  . Smoking status: Former Games developer  . Smokeless tobacco: Never Used  . Tobacco comment: quit 20 years ago  Substance Use Topics  . Alcohol use: Yes    Comment: pt reports 1 beer daily  . Drug use: Yes    Types: Marijuana    Current Outpatient Medications  Medication Sig Dispense Refill  . bisoprolol-hydrochlorothiazide (ZIAC) 10-6.25 MG per tablet Take 1 tablet by mouth daily. 30 tablet 0  . losartan (COZAAR) 100 MG tablet Take 100 mg by mouth every morning.  1  . traZODone (DESYREL) 50 MG tablet Take 50 mg by mouth at bedtime as needed for sleep.   4   No current facility-administered medications for this visit.     Allergies  Allergen Reactions  . Asa [Aspirin] Nausea Only    Review of Systems:  Constitutional: Denies fever, chills, diaphoresis, appetite change and fatigue.  HEENT: Denies photophobia, eye pain, redness, hearing loss, ear pain, congestion, sore throat, rhinorrhea,  sneezing, mouth sores, neck pain, neck stiffness and tinnitus.   Respiratory: Some SOB, DOE and wheezing.  Has COPD. Cardiovascular: Denies chest pain, palpitations and leg swelling.  Genitourinary: Denies dysuria, urgency, frequency, hematuria, flank pain and difficulty urinating.  Musculoskeletal: Denies myalgias, back pain, joint swelling, arthralgias and gait problem.  Skin: No rash.  Neurological: Denies dizziness, seizures, syncope, weakness, light-headedness, numbness and headaches.  Hematological: Denies adenopathy. Easy bruising,  personal or family bleeding history  Psychiatric/Behavioral: Has anxiety, no depression.  Has a very stressful job.     Physical Exam:    BP 118/72   Pulse 78   Ht 6' (1.829 m)   Wt 151 lb 4 oz (68.6 kg)   BMI 20.51 kg/m  Filed Weights   10/16/18 1433  Weight: 151 lb 4 oz (68.6 kg)   Constitutional:  Well-developed, in no acute distress. Psychiatric: Normal mood and affect. Behavior is normal. HEENT: Pupils normal.  Conjunctivae are normal. No scleral icterus. Neck supple.  Cardiovascular: Normal rate, regular rhythm. No edema Pulmonary/chest: Effort normal and breath sounds decreased. No wheezing, rales or rhonchi. Abdominal: Soft, nondistended.  Mild right upper quadrant abdominal tenderness. Bowel sounds active throughout. There are no masses palpable. No hepatomegaly. Rectal:  defered Neurological: Alert and oriented to person place and time. Skin: Skin is warm and dry. No rashes noted.  Data Reviewed: I have personally reviewed following labs and imaging studies  CBC: CBC Latest Ref Rng & Units 09/11/2018 12/21/2017 12/21/2017  WBC 4.0 - 10.5 K/uL 4.9 10.3 10.2  Hemoglobin 13.0 - 17.0 g/dL 09.8 12.7(L) 12.1(L)  Hematocrit 39.0 - 52.0 % 43.7 37.6(L) 36.7(L)  Platelets 150 - 400 K/uL 340 261 302    CMP: CMP Latest Ref Rng & Units 09/11/2018 12/21/2017 12/20/2017  Glucose 70 - 99 mg/dL 119(J) 478(G) 956(O)  BUN 6 - 20 mg/dL 14 7 7   Creatinine 0.61 - 1.24 mg/dL 1.30 8.65 7.84(O)  Sodium 135 - 145 mmol/L 137 132(L) 137  Potassium 3.5 - 5.1 mmol/L 3.9 3.9 4.1  Chloride 98 - 111 mmol/L 95(L) 93(L) 96(L)  CO2 22 - 32 mmol/L 30 28 29   Calcium 8.9 - 10.3 mg/dL 96.2 8.7(L) 9.3  Total Protein 6.5 - 8.1 g/dL 9.1(H) - -  Total Bilirubin 0.3 - 1.2 mg/dL 1.0 - -  Alkaline Phos 38 - 126 U/L 58 - -  AST 15 - 41 U/L 87(H) - -  ALT 0 - 44 U/L 61(H) - -   Lipase     Component Value Date/Time   LIPASE 63 (H) 09/11/2018 1532  Ultrasound 09/11/2018-hepatic steatosis, no  gallstones. Discussed in detail with the patient and patient's wife.  Highly motivated now to stop alcohol   Edman Circle, MD 10/16/2018, 2:43 PM  Cc: Elisha Ponder, PA-C

## 2018-10-16 NOTE — Patient Instructions (Signed)
If you are age 55 or older, your body mass index should be between 23-30. Your Body mass index is 20.51 kg/m. If this is out of the aforementioned range listed, please consider follow up with your Primary Care Provider.  If you are age 79 or younger, your body mass index should be between 19-25. Your Body mass index is 20.51 kg/m. If this is out of the aformentioned range listed, please consider follow up with your Primary Care Provider.   We have sent the following medications to your pharmacy for you to pick up at your convenience: Protonix 40 mg once daily.   Stop all Alcohol use  You have been scheduled for an endoscopy. Please follow written instructions given to you at your visit today. If you use inhalers (even only as needed), please bring them with you on the day of your procedure. Your physician has requested that you go to www.startemmi.com and enter the access code given to you at your visit today. This web site gives a general overview about your procedure. However, you should still follow specific instructions given to you by our office regarding your preparation for the procedure.   Thank you,  Dr. Lynann Bologna

## 2018-10-16 NOTE — Progress Notes (Deleted)
Referring Provider: Delia Chimes Primary Care Physician:  Mirna Mires, MD   Reason for Consultation:  RUQ pain   IMPRESSION:  RUQ pain    - no source on ultrasound Echogenic liver Abnormal transaminases, new since 11/2017  PLAN: - labs for chronic liver disease: ferritin, iron, ANA, IgG, HBcAb IgM, HBsAg, HCV Ab - EGD  I consented the patient at the bedside today discussing the risks, benefits, and alternatives to endoscopic evaluation. In particular, we discussed the risks that include, but are not limited to, reaction to medication, cardiopulmonary compromise, bleeding requiring blood transfusion, aspiration resulting in pneumonia, perforation requiring surgery, and even death. We reviewed the risk of missed lesion including polyps or even cancer. The patient acknowledges these risks and asks that we proceed.   HPI: Ray [name] is a 57 y.o. male   History of right Vats, and right stapling of blebs   EKG and troponin negative in the ED  RUQ ultrasound 09/11/18: echogenic liver, otherwise normal  Past Medical History:  Diagnosis Date  . Atelectasis    r/side  . Hypertension     Past Surgical History:  Procedure Laterality Date  . STAPLING OF BLEBS Right 12/20/2017   Procedure: STAPLING OF BLEBS;  Surgeon: Delight Ovens, MD;  Location: [facility] OR;  Service: Thoracic;  Laterality: Right;  Marland Kitchen VIDEO ASSISTED THORACOSCOPY Right 12/20/2017   Procedure: VIDEO ASSISTED THORACOSCOPY;  Surgeon: Delight Ovens, MD;  Location: [facility] OR;  Service: Thoracic;  Laterality: Right;  Marland Kitchen VIDEO BRONCHOSCOPY N/A 12/20/2017   Procedure: VIDEO BRONCHOSCOPY;  Surgeon: Delight Ovens, MD;  Location: [facility] OR;  Service: Thoracic;  Laterality: N/A;    Prior to Admission medications   Medication Sig Start Date End Date Taking? Authorizing Provider  bisoprolol-hydrochlorothiazide (ZIAC) 10-6.25 MG per tablet Take 1 tablet by mouth daily. 12/23/13   Nelva Nay, MD  dicyclomine  (BENTYL) 20 MG tablet Take 1 tablet (20 mg total) by mouth 2 (two) times daily. 09/11/18   Aviva Kluver B, PA-C  losartan (COZAAR) 100 MG tablet Take 100 mg by mouth every morning. 02/16/17   [provider]  pantoprazole (PROTONIX) 20 MG tablet Take 1 tablet (20 mg total) by mouth daily. 09/11/18   Aviva Kluver B, PA-C  sucralfate (CARAFATE) 1 g tablet Take 1 tablet (1 g total) by mouth 4 (four) times daily -  with meals and at bedtime for 7 days. 09/11/18 09/18/18  Aviva Kluver B, PA-C  traMADol (ULTRAM) 50 MG tablet Take 1 tablet (50 mg total) by mouth every 6 (six) hours as needed (mild pain). Patient not taking: Reported on 01/14/2018 12/23/17   Barrett, Rae Roam, PA-C  traZODone (DESYREL) 50 MG tablet Take 50 mg by mouth at bedtime as needed for sleep.  01/29/17   [provider]    Current Outpatient Medications  Medication Sig Dispense Refill  . bisoprolol-hydrochlorothiazide (ZIAC) 10-6.25 MG per tablet Take 1 tablet by mouth daily. 30 tablet 0  . dicyclomine (BENTYL) 20 MG tablet Take 1 tablet (20 mg total) by mouth 2 (two) times daily. 20 tablet 0  . losartan (COZAAR) 100 MG tablet Take 100 mg by mouth every morning.  1  . pantoprazole (PROTONIX) 20 MG tablet Take 1 tablet (20 mg total) by mouth daily. 30 tablet 0  . sucralfate (CARAFATE) 1 g tablet Take 1 tablet (1 g total) by mouth 4 (four) times daily -  with meals and at bedtime for 7 days. 28 tablet 0  .  traMADol (ULTRAM) 50 MG tablet Take 1 tablet (50 mg total) by mouth every 6 (six) hours as needed (mild pain). (Patient not taking: Reported on 01/14/2018) 30 tablet 0  . traZODone (DESYREL) 50 MG tablet Take 50 mg by mouth at bedtime as needed for sleep.   4   No current facility-administered medications for this visit.     Allergies as of 10/16/2018 - Review Complete 09/11/2018  Allergen Reaction Noted  . Asa [aspirin] Nausea Only 12/23/2013    Family History  Problem Relation Age of Onset  . Hypertension  Mother     Social History   Socioeconomic History  . Marital status: Married    Spouse name: Not on file  . Number of children: Not on file  . Years of education: Not on file  . Highest education level: Not on file  Occupational History  . Not on file  Social Needs  . Financial resource strain: Not on file  . Food insecurity:    Worry: Not on file    Inability: Not on file  . Transportation needs:    Medical: Not on file    Non-medical: Not on file  Tobacco Use  . Smoking status: Former Games developer  . Smokeless tobacco: Never Used  Substance and Sexual Activity  . Alcohol use: Yes    Comment: pt reports 1 beer daily  . Drug use: Yes    Types: Marijuana  . Sexual activity: Yes  Lifestyle  . Physical activity:    Days per week: Not on file    Minutes per session: Not on file  . Stress: Not on file  Relationships  . Social connections:    Talks on phone: Not on file    Gets together: Not on file    Attends religious service: Not on file    Active member of club or organization: Not on file    Attends meetings of clubs or organizations: Not on file    Relationship status: Not on file  . Intimate partner violence:    Fear of current or ex partner: Not on file    Emotionally abused: Not on file    Physically abused: Not on file    Forced sexual activity: Not on file  Other Topics Concern  . Not on file  Social History Narrative  . Not on file    Review of Systems: 12 system ROS is negative except as noted above.   Physical Exam: Vital signs were reviewed. General:   Alert, well-nourished, pleasant and cooperative in NAD Head:  Normocephalic and atraumatic. Eyes:  Sclera clear, no icterus.   Conjunctiva pink. Mouth:  No deformity or lesions.   Neck:  Supple; no thyromegaly. Lungs:  Clear throughout to auscultation.   No wheezes.  Heart:  Regular rate and rhythm; no murmurs Abdomen:  Soft, nontender, normal bowel sounds. No rebound or guarding. No  hepatosplenomegaly Rectal:  Deferred  Msk:  Symmetrical without gross deformities. Extremities:  No gross deformities or edema. Neurologic:  Alert and  oriented x4;  grossly nonfocal Skin:  No rash or bruise. Psych:  Alert and cooperative. Normal mood and affect.   Shadasia Oldfield L. Orvan Falconer Md, MPH [facility] 10/16/2018, 7:04 AM

## 2018-11-04 ENCOUNTER — Other Ambulatory Visit: Payer: Self-pay | Admitting: Family Medicine

## 2018-11-04 ENCOUNTER — Other Ambulatory Visit (HOSPITAL_COMMUNITY)
Admission: RE | Admit: 2018-11-04 | Discharge: 2018-11-04 | Disposition: A | Discharge status: Home / Self Care | Payer: 59 | Source: Ambulatory Visit | Attending: Family Medicine | Admitting: Family Medicine

## 2018-11-04 DIAGNOSIS — N898 Other specified noninflammatory disorders of vagina: Secondary | ICD-10-CM | POA: Diagnosis present

## 2018-11-04 DIAGNOSIS — Z113 Encounter for screening for infections with a predominantly sexual mode of transmission: Secondary | ICD-10-CM | POA: Insufficient documentation

## 2018-11-07 LAB — URINE CYTOLOGY ANCILLARY ONLY
BACTERIAL VAGINITIS: NEGATIVE
CANDIDA VAGINITIS: NEGATIVE
CHLAMYDIA, DNA PROBE: NEGATIVE
Neisseria Gonorrhea: NEGATIVE
Trichomonas: NEGATIVE

## 2018-11-11 ENCOUNTER — Encounter: Payer: Self-pay | Admitting: Gastroenterology

## 2018-11-25 ENCOUNTER — Encounter: Payer: Self-pay | Admitting: Gastroenterology

## 2018-11-25 ENCOUNTER — Ambulatory Visit (AMBULATORY_SURGERY_CENTER): Payer: 59 | Admitting: Gastroenterology

## 2018-11-25 VITALS — BP 142/76 | HR 71 | Temp 98.9°F | Resp 18 | Ht 72.0 in | Wt 151.0 lb

## 2018-11-25 DIAGNOSIS — K2970 Gastritis, unspecified, without bleeding: Secondary | ICD-10-CM | POA: Diagnosis not present

## 2018-11-25 DIAGNOSIS — R1011 Right upper quadrant pain: Secondary | ICD-10-CM

## 2018-11-25 MED ORDER — SODIUM CHLORIDE 0.9 % IV SOLN: 500.0000 mL | Freq: Once | INTRAVENOUS | Status: DC

## 2018-11-25 NOTE — Progress Notes (Signed)
Report to RN, VSS, adequate respirations noted, no c/o pain or discomfort 

## 2018-11-25 NOTE — Patient Instructions (Addendum)
INFORMATION ON GASTRITIS GIVEN TO YOU TODAY   AWAIT BIOPSY RESULTS  CONTINUE PROTONIX 40 MG DAILY BY MOUTH  NO ASPIRIN,IBUPROFEN, NAPROXEN OR OTHER NON-STEROIDAL ANTI -INFLAMMATORY DRUGS    STOP DRINKING ALCOHOL   RETURN TO GI CLINIC IN 12 WEEKS - MAKE THIS APPOINTMENT   YOU HAD AN ENDOSCOPIC PROCEDURE TODAY AT THE  ENDOSCOPY CENTER:   Refer to the procedure report that was given to you for any specific questions about what was found during the examination.  If the procedure report does not answer your questions, please call your gastroenterologist to clarify.  If you requested that your care partner not be given the details of your procedure findings, then the procedure report has been included in a sealed envelope for you to review at your convenience later.  YOU SHOULD EXPECT: Some feelings of bloating in the abdomen. Passage of more gas than usual.  Walking can help get rid of the air that was put into your GI tract during the procedure and reduce the bloating. If you had a lower endoscopy (such as a colonoscopy or flexible sigmoidoscopy) you may notice spotting of blood in your stool or on the toilet paper. If you underwent a bowel prep for your procedure, you may not have a normal bowel movement for a few days.  Please Note:  You might notice some irritation and congestion in your nose or some drainage.  This is from the oxygen used during your procedure.  There is no need for concern and it should clear up in a day or so.  SYMPTOMS TO REPORT IMMEDIATELY:     Following upper endoscopy (EGD)  Vomiting of blood or coffee ground material  New chest pain or pain under the shoulder blades  Painful or persistently difficult swallowing  New shortness of breath  Fever of 100F or higher  Black, tarry-looking stools  For urgent or emergent issues, a gastroenterologist can be reached at any hour by calling [phone].   DIET:  We do recommend a small meal at first, but  then you may proceed to your regular diet.  Drink plenty of fluids but you should avoid alcoholic beverages for 24 hours.  ACTIVITY:  You should plan to take it easy for the rest of today and you should NOT DRIVE or use heavy machinery until tomorrow (because of the sedation medicines used during the test).    FOLLOW UP: Our staff will call the number listed on your records the next business day following your procedure to check on you and address any questions or concerns that you may have regarding the information given to you following your procedure. If we do not reach you, we will leave a message.  However, if you are feeling well and you are not experiencing any problems, there is no need to return our call.  We will assume that you have returned to your regular daily activities without incident.  If any biopsies were taken you will be contacted by phone or by letter within the next 1-3 weeks.  Please call us at [phone] if you have not heard about the biopsies in 3 weeks.    SIGNATURES/CONFIDENTIALITY: You and/or your care partner have signed paperwork which will be entered into your electronic medical record.  These signatures attest to the fact that that the information above on your After Visit Summary has been reviewed and is understood.  Full responsibility of the confidentiality of this discharge information lies with you and/or  your care-partner. 

## 2018-11-25 NOTE — Progress Notes (Signed)
Called to room to assist during endoscopic procedure.  Patient ID and intended procedure confirmed with present staff. Received instructions for my participation in the procedure from the performing physician.  

## 2018-11-26 ENCOUNTER — Telehealth: Payer: Self-pay | Admitting: *Deleted

## 2018-11-26 NOTE — Telephone Encounter (Signed)
  Follow up Call-  Call back number 11/25/2018  Post procedure Call Back phone  # [phone]  Permission to leave phone message Yes  Some recent data might be hidden     Patient questions:  Do you have a fever, pain , or abdominal swelling? No. Pain Score  0 *  Have you tolerated food without any problems? Yes.    Have you been able to return to your normal activities? Yes.    Do you have any questions about your discharge instructions: Diet   No. Medications  No. Follow up visit  No.  Do you have questions or concerns about your Care? No.  Actions: * If pain score is 4 or above: No action needed, pain <4.  **Patient stated he doesn't have any pain from procedure but continues to have the same abdominal pain that he had prior to procedure.  He said it was slightly better and will wait to follow up in office in 12 weeks.

## 2018-11-26 NOTE — Telephone Encounter (Signed)
No answer for post procedure call back. Left message and will attempt to call back later this afternoon. SM 

## 2018-11-27 NOTE — Op Note (Signed)
[facility] Patient Name: Ray [name] Procedure Date: 11/25/2018 3:38 PM MRN: [id number] Endoscopist: Lynann Bolognaajesh Lia Vigilante , MD Age: [age] Referring MD:  Date of Birth: [birth year] Gender: Male Account #: [id number] Procedure:                Upper GI endoscopy Indications:              Abdominal pain in the right upper quadrant Medicines:                Monitored Anesthesia Care Procedure:                Pre-Anesthesia Assessment:                           - Prior to the procedure, a History and Physical                            was performed, and patient medications and                            allergies were reviewed. The patient's tolerance of                            previous anesthesia was also reviewed. The risks                            and benefits of the procedure and the sedation                            options and risks were discussed with the patient.                            All questions were answered, and informed consent                            was obtained. Prior Anticoagulants: The patient has                            taken no previous anticoagulant or antiplatelet                            agents. ASA Grade Assessment: II - A patient with                            mild systemic disease. After reviewing the risks                            and benefits, the patient was deemed in                            satisfactory condition to undergo the procedure.                           After obtaining informed consent, the endoscope was  passed under direct vision. Throughout the                            procedure, the patient's blood pressure, pulse, and                            oxygen saturations were monitored continuously. The                            Model [id number]-[id number] scope was introduced                            through the mouth, and advanced to the second part                            of duodenum.  The upper GI endoscopy was                            accomplished without difficulty. The patient                            tolerated the procedure well. Scope In: Scope Out: Findings:                 The examined esophagus was normal. No varices.                           The Z-line was regular and was found 40 cm from the                            incisors.                           Diffuse mild inflammation with prominent gastric                            folds (? Importance) was found in the stomach.                            Biopsies were taken with a cold forceps for                            histology from the body of the stomach, antrum and                            fundus. We also utilized tunnel technique to obtain                            deeper biopsies. No fundal varices.                           The examined duodenum was normal. Complications:            No immediate complications. Estimated Blood Loss:     Estimated blood loss: none. Impression:               -  Mild Gastritis. Biopsied.                           - Otherwise normal EGD. Recommendation:           - Resume previous diet.                           - Continue Protonix 40 mg p.o. once a day.                           - Stop drinking alcohol.                           - Await pathology results.                           - No aspirin, ibuprofen, naproxen, or other                            non-steroidal anti-inflammatory drugs.                           - Return to GI clinic in 12 weeks. Lynann Bologna, MD 11/25/2018 4:02:26 PM This report has been signed electronically.

## 2018-12-01 ENCOUNTER — Encounter: Payer: Self-pay | Admitting: Gastroenterology

## 2019-02-12 ENCOUNTER — Other Ambulatory Visit: Payer: Self-pay | Admitting: Gastroenterology

## 2019-02-12 NOTE — Telephone Encounter (Signed)
Please assist in this request

## 2019-07-06 ENCOUNTER — Other Ambulatory Visit: Payer: Self-pay | Admitting: Gastroenterology

## 2019-07-10 ENCOUNTER — Other Ambulatory Visit: Payer: Self-pay | Admitting: Internal Medicine

## 2019-07-10 DIAGNOSIS — Z20822 Contact with and (suspected) exposure to covid-19: Secondary | ICD-10-CM

## 2019-07-15 LAB — NOVEL CORONAVIRUS, NAA: SARS-CoV-2, NAA: NOT DETECTED

## 2019-08-31 ENCOUNTER — Other Ambulatory Visit: Payer: Self-pay | Admitting: Gastroenterology

## 2019-09-01 NOTE — Telephone Encounter (Signed)
Please assist in this refill 

## 2019-11-01 ENCOUNTER — Other Ambulatory Visit: Payer: Self-pay | Admitting: Gastroenterology

## 2019-11-05 ENCOUNTER — Encounter (HOSPITAL_COMMUNITY): Payer: Self-pay | Admitting: Obstetrics and Gynecology

## 2019-11-05 ENCOUNTER — Emergency Department (HOSPITAL_COMMUNITY): Payer: 59

## 2019-11-05 ENCOUNTER — Other Ambulatory Visit: Payer: Self-pay

## 2019-11-05 ENCOUNTER — Inpatient Hospital Stay (HOSPITAL_COMMUNITY)
Admission: EM | Admit: 2019-11-05 | Discharge: 2019-11-12 | DRG: 164 | Disposition: A | Discharge status: Home / Self Care | Payer: 59 | Attending: Internal Medicine | Admitting: Internal Medicine

## 2019-11-05 DIAGNOSIS — Z888 Allergy status to other drugs, medicaments and biological substances status: Secondary | ICD-10-CM

## 2019-11-05 DIAGNOSIS — Z4682 Encounter for fitting and adjustment of non-vascular catheter: Secondary | ICD-10-CM

## 2019-11-05 DIAGNOSIS — Z09 Encounter for follow-up examination after completed treatment for conditions other than malignant neoplasm: Secondary | ICD-10-CM

## 2019-11-05 DIAGNOSIS — D649 Anemia, unspecified: Secondary | ICD-10-CM | POA: Diagnosis present

## 2019-11-05 DIAGNOSIS — Z87891 Personal history of nicotine dependence: Secondary | ICD-10-CM

## 2019-11-05 DIAGNOSIS — G4700 Insomnia, unspecified: Secondary | ICD-10-CM | POA: Diagnosis present

## 2019-11-05 DIAGNOSIS — I10 Essential (primary) hypertension: Secondary | ICD-10-CM | POA: Diagnosis present

## 2019-11-05 DIAGNOSIS — J930 Spontaneous tension pneumothorax: Principal | ICD-10-CM | POA: Diagnosis present

## 2019-11-05 DIAGNOSIS — Z9689 Presence of other specified functional implants: Secondary | ICD-10-CM

## 2019-11-05 DIAGNOSIS — J9811 Atelectasis: Secondary | ICD-10-CM | POA: Diagnosis present

## 2019-11-05 DIAGNOSIS — Z886 Allergy status to analgesic agent status: Secondary | ICD-10-CM

## 2019-11-05 DIAGNOSIS — K219 Gastro-esophageal reflux disease without esophagitis: Secondary | ICD-10-CM | POA: Diagnosis present

## 2019-11-05 DIAGNOSIS — E876 Hypokalemia: Secondary | ICD-10-CM | POA: Diagnosis present

## 2019-11-05 DIAGNOSIS — J9383 Other pneumothorax: Secondary | ICD-10-CM

## 2019-11-05 DIAGNOSIS — Z79899 Other long term (current) drug therapy: Secondary | ICD-10-CM

## 2019-11-05 DIAGNOSIS — J439 Emphysema, unspecified: Secondary | ICD-10-CM | POA: Diagnosis present

## 2019-11-05 DIAGNOSIS — Z8249 Family history of ischemic heart disease and other diseases of the circulatory system: Secondary | ICD-10-CM

## 2019-11-05 DIAGNOSIS — E785 Hyperlipidemia, unspecified: Secondary | ICD-10-CM | POA: Diagnosis present

## 2019-11-05 DIAGNOSIS — Z20828 Contact with and (suspected) exposure to other viral communicable diseases: Secondary | ICD-10-CM | POA: Diagnosis present

## 2019-11-05 DIAGNOSIS — J939 Pneumothorax, unspecified: Secondary | ICD-10-CM | POA: Diagnosis present

## 2019-11-05 MED ORDER — OXYCODONE-ACETAMINOPHEN 5-325 MG PO TABS
1.0000 | ORAL_TABLET | Freq: Once | ORAL | Status: AC
  Administered 2019-11-05: 1 via ORAL
  Filled 2019-11-05: qty 1

## 2019-11-05 NOTE — ED Provider Notes (Signed)
[place] [facility] DEPT Provider Note   CSN: [id number] Arrival date & time: 11/05/19  1712     History   Chief Complaint Chief Complaint  Patient presents with  . Shortness of Breath    HPI Ray [name] is a 58 y.o. male with history of a spontaneous pneumothorax who presents with chest pain. The patient states that he picked up a case of water today around 3PM and had an acute onset of L upper chest pain and shoulder pain. He states it feels exactly like it did when he had the pneumothorax last time except that was on the right side. He denies injury to the chest. He does not smoke anymore. He reports SOB with exertion but feels fine at rest. He denies fever. He has a chronic cough. The pneumothorax occurred in March 2018 and the subsequent one occurred in Dec 2018. Both were large and he had a chest tube placed. The 2nd time he had a video bronchoscopy and VATS and stapling of lung blebs by Dr. Servando Snare.     HPI  Past Medical History:  Diagnosis Date  . Atelectasis    r/side  . Hypertension     Patient Active Problem List   Diagnosis Date Noted  . Lung blebs ([place]) 12/20/2017  . Pneumothorax on right 12/19/2017  . Pneumothorax 02/26/2017  . Hypertension 02/26/2017  . Nicotine dependence 02/26/2017  . Spontaneous pneumothorax 02/26/2017    Past Surgical History:  Procedure Laterality Date  . COLONOSCOPY     done at age 77 per pt and was told to have it in 10 years again  . STAPLING OF BLEBS Right 12/20/2017   Procedure: STAPLING OF BLEBS;  Surgeon: Grace Isaac, MD;  Location: [place];  Service: Thoracic;  Laterality: Right;  Marland Kitchen VIDEO ASSISTED THORACOSCOPY Right 12/20/2017   Procedure: VIDEO ASSISTED THORACOSCOPY;  Surgeon: Grace Isaac, MD;  Location: [facility];  Service: Thoracic;  Laterality: Right;  Marland Kitchen VIDEO BRONCHOSCOPY N/A 12/20/2017   Procedure: VIDEO BRONCHOSCOPY;  Surgeon: Grace Isaac, MD;  Location: [facility];  Service:  Thoracic;  Laterality: N/A;        Home Medications    Prior to Admission medications   Medication Sig Start Date End Date Taking? Authorizing Provider  bisoprolol-hydrochlorothiazide (ZIAC) 10-6.25 MG per tablet Take 1 tablet by mouth daily. 12/23/13   Leonard Schwartz, MD  losartan (COZAAR) 100 MG tablet Take 100 mg by mouth every morning. 02/16/17   [provider]  pantoprazole (PROTONIX) 40 MG tablet TAKE 1 TABLET BY MOUTH EVERY DAY 11/02/19   Jackquline Denmark, MD  traZODone (DESYREL) 50 MG tablet Take 50 mg by mouth at bedtime as needed for sleep.  01/29/17   [provider]    Family History Family History  Problem Relation Age of Onset  . Hypertension Mother   . Colon cancer Neg Hx   . Esophageal cancer Neg Hx     Social History Social History   Tobacco Use  . Smoking status: Former Research scientist (life sciences)  . Smokeless tobacco: Never Used  . Tobacco comment: quit 20 years ago  Substance Use Topics  . Alcohol use: Yes    Comment: pt reports 1 beer daily  . Drug use: Yes    Types: Marijuana     Allergies   Asa [aspirin]   Review of Systems Review of Systems  Constitutional: Negative for fever.  Respiratory: Positive for shortness of breath. Negative for cough.   Cardiovascular: Positive for  chest pain. Negative for leg swelling.  Gastrointestinal: Negative for abdominal pain.  Neurological: Negative for syncope and light-headedness.  All other systems reviewed and are negative.    Physical Exam Updated Vital Signs BP (!) 148/89   Pulse 79   Temp 98.5 F (36.9 C) (Oral)   Resp 17   SpO2 100%   Physical Exam Vitals signs and nursing note reviewed.  Constitutional:      General: He is not in acute distress.    Appearance: He is well-developed. He is not ill-appearing.  HENT:     Head: Normocephalic and atraumatic.  Eyes:     General: No scleral icterus.       Right eye: No discharge.        Left eye: No discharge.     Conjunctiva/sclera:  Conjunctivae normal.     Pupils: Pupils are equal, round, and reactive to light.  Neck:     Musculoskeletal: Normal range of motion.  Cardiovascular:     Rate and Rhythm: Normal rate and regular rhythm.  Pulmonary:     Effort: Pulmonary effort is normal. No respiratory distress.     Breath sounds: Normal breath sounds.  Abdominal:     General: There is no distension.     Palpations: Abdomen is soft.     Tenderness: There is no abdominal tenderness.  Skin:    General: Skin is warm and dry.  Neurological:     Mental Status: He is alert and oriented to person, place, and time.  Psychiatric:        Mood and Affect: Mood normal.        Behavior: Behavior normal.      ED Treatments / Results  Labs (all labs ordered are listed, but only abnormal results are displayed) Labs Reviewed - No data to display  EKG None  Radiology Dg Chest 2 View  Result Date: 11/05/2019 CLINICAL DATA:  Left shoulder blade pain. History of right-sided lung collapse. EXAM: CHEST - 2 VIEW COMPARISON:  September 11, 2018 FINDINGS: There is a left-sided pneumothorax extending from apex towards base measuring 11 mm laterally and 17 mm at the apex. No shift to suggest tension pneumothorax. Postsurgical changes are seen in the right apex. No right-sided pneumothorax. Cardiomediastinal silhouette is stable. The lungs are clear. IMPRESSION: New left pneumothorax measuring 17 mm at the apex and 11 mm laterally with no evidence of tension. Findings called to the patient's PA, Terance HartKelly Jazmarie Biever. Electronically Signed   By: Gerome Samavid  Williams III M.D   On: 11/05/2019 18:47    Procedures Procedures (including critical care time)  Medications Ordered in ED Medications  oxyCODONE-acetaminophen (PERCOCET/ROXICET) 5-325 MG per tablet 1 tablet (1 tablet Oral Given 11/05/19 1923)     Initial Impression / Assessment and Plan / ED Course  I have reviewed the triage vital signs and the nursing notes.  Pertinent labs & imaging  results that were available during my care of the patient were reviewed by me and considered in my medical decision making (see chart for details).  58 year old male presents with acute onset of left-sided chest pain with exertional shortness of breath that occurred after lifting a case of water.  Patient feels that similar pain that he had with his last pneumothorax.  Chest x-ray was ordered and shows a new left-sided pneumothorax in the apex of the lung which is very small.  His prior pneumothorax were on the right side. Will discuss with CT surgery. Pain control given.  Discussed  with Dr. Raydel Sage with CT surgery who recommends repeat imaging in 6 to 8 hours to confirm pneumothorax is not getting bigger.  At this time he does not recommend a chest tube.   Repeat CXR ordered. Care signed out to Adaline Sill NP   Final Clinical Impressions(s) / ED Diagnoses   Final diagnoses:  Spontaneous pneumothorax    ED Discharge Orders    None       Bethel Born, PA-C 11/05/19 2057    Mesner, Barbara Cower, MD 11/06/19 [phone]

## 2019-11-05 NOTE — ED Triage Notes (Signed)
Pt reports pain in his left shoulder blade. Patient states he has a hx of his lungs collapsing. Patient reports it started yesterday and he is concerned he has another collapsed lung.

## 2019-11-06 ENCOUNTER — Inpatient Hospital Stay (HOSPITAL_COMMUNITY): Payer: 59

## 2019-11-06 ENCOUNTER — Emergency Department (HOSPITAL_COMMUNITY): Payer: 59

## 2019-11-06 DIAGNOSIS — G4700 Insomnia, unspecified: Secondary | ICD-10-CM | POA: Diagnosis present

## 2019-11-06 DIAGNOSIS — I10 Essential (primary) hypertension: Secondary | ICD-10-CM | POA: Diagnosis present

## 2019-11-06 DIAGNOSIS — K219 Gastro-esophageal reflux disease without esophagitis: Secondary | ICD-10-CM | POA: Diagnosis present

## 2019-11-06 DIAGNOSIS — J439 Emphysema, unspecified: Secondary | ICD-10-CM | POA: Diagnosis present

## 2019-11-06 DIAGNOSIS — Z87891 Personal history of nicotine dependence: Secondary | ICD-10-CM | POA: Diagnosis not present

## 2019-11-06 DIAGNOSIS — J930 Spontaneous tension pneumothorax: Secondary | ICD-10-CM | POA: Diagnosis present

## 2019-11-06 DIAGNOSIS — J9311 Primary spontaneous pneumothorax: Secondary | ICD-10-CM

## 2019-11-06 DIAGNOSIS — E876 Hypokalemia: Secondary | ICD-10-CM | POA: Diagnosis present

## 2019-11-06 DIAGNOSIS — D649 Anemia, unspecified: Secondary | ICD-10-CM | POA: Diagnosis present

## 2019-11-06 DIAGNOSIS — Z20828 Contact with and (suspected) exposure to other viral communicable diseases: Secondary | ICD-10-CM | POA: Diagnosis present

## 2019-11-06 DIAGNOSIS — Z888 Allergy status to other drugs, medicaments and biological substances status: Secondary | ICD-10-CM | POA: Diagnosis not present

## 2019-11-06 DIAGNOSIS — Z886 Allergy status to analgesic agent status: Secondary | ICD-10-CM | POA: Diagnosis not present

## 2019-11-06 DIAGNOSIS — J9811 Atelectasis: Secondary | ICD-10-CM | POA: Diagnosis present

## 2019-11-06 DIAGNOSIS — Z79899 Other long term (current) drug therapy: Secondary | ICD-10-CM | POA: Diagnosis not present

## 2019-11-06 DIAGNOSIS — E785 Hyperlipidemia, unspecified: Secondary | ICD-10-CM | POA: Diagnosis present

## 2019-11-06 DIAGNOSIS — Z8249 Family history of ischemic heart disease and other diseases of the circulatory system: Secondary | ICD-10-CM | POA: Diagnosis not present

## 2019-11-06 DIAGNOSIS — J9383 Other pneumothorax: Secondary | ICD-10-CM | POA: Diagnosis present

## 2019-11-06 LAB — BASIC METABOLIC PANEL
Anion gap: 17 — ABNORMAL HIGH (ref 5–15)
BUN: 7 mg/dL (ref 6–20)
CO2: 23 mmol/L (ref 22–32)
Calcium: 9.1 mg/dL (ref 8.9–10.3)
Chloride: 100 mmol/L (ref 98–111)
Creatinine, Ser: 1.09 mg/dL (ref 0.61–1.24)
GFR calc Af Amer: >60 mL/min (ref >60)
GFR calc non Af Amer: >60 mL/min (ref >60)
Glucose, Bld: 124 mg/dL — ABNORMAL HIGH (ref 70–99)
Potassium: 2.9 mmol/L — ABNORMAL LOW (ref 3.5–5.1)
Sodium: 140 mmol/L (ref 135–145)

## 2019-11-06 LAB — CBC WITH DIFFERENTIAL/PLATELET
Abs Immature Granulocytes: 0.03 10*3/uL (ref 0.00–0.07)
Basophils Absolute: 0.1 10*3/uL (ref 0.0–0.1)
Basophils Relative: 1 %
Eosinophils Absolute: 0 10*3/uL (ref 0.0–0.5)
Eosinophils Relative: 0 %
HCT: 37.9 % — ABNORMAL LOW (ref 39.0–52.0)
Hemoglobin: 12.4 g/dL — ABNORMAL LOW (ref 13.0–17.0)
Immature Granulocytes: 0 %
Lymphocytes Relative: 15 %
Lymphs Abs: 1.2 10*3/uL (ref 0.7–4.0)
MCH: 32.5 pg (ref 26.0–34.0)
MCHC: 32.7 g/dL (ref 30.0–36.0)
MCV: 99.2 fL (ref 80.0–100.0)
Monocytes Absolute: 0.8 10*3/uL (ref 0.1–1.0)
Monocytes Relative: 10 %
Neutro Abs: 6.1 10*3/uL (ref 1.7–7.7)
Neutrophils Relative %: 74 %
Platelets: 305 10*3/uL (ref 150–400)
RBC: 3.82 MIL/uL — ABNORMAL LOW (ref 4.22–5.81)
RDW: 13.4 % (ref 11.5–15.5)
WBC: 8.2 10*3/uL (ref 4.0–10.5)
nRBC: 0 % (ref 0.0–0.2)

## 2019-11-06 LAB — PROTIME-INR
INR: 0.9 (ref 0.8–1.2)
Prothrombin Time: 12.1 seconds (ref 11.4–15.2)

## 2019-11-06 LAB — SARS CORONAVIRUS 2 (TAT 6-24 HRS): SARS Coronavirus 2: NEGATIVE

## 2019-11-06 MED ORDER — LOSARTAN POTASSIUM 50 MG PO TABS
100.0000 mg | ORAL_TABLET | Freq: Every day | ORAL | Status: DC
  Administered 2019-11-06 – 2019-11-07 (×2): 100 mg via ORAL
  Filled 2019-11-06 (×2): qty 2

## 2019-11-06 MED ORDER — DIPHENHYDRAMINE HCL 50 MG/ML IJ SOLN: 25.0000 mg | Freq: Four times a day (QID) | INTRAMUSCULAR | Status: DC | PRN

## 2019-11-06 MED ORDER — ONDANSETRON HCL 4 MG/2ML IJ SOLN: 4.0000 mg | Freq: Once | INTRAMUSCULAR | Status: DC

## 2019-11-06 MED ORDER — ACETAMINOPHEN 650 MG RE SUPP: 650.0000 mg | Freq: Four times a day (QID) | RECTAL | Status: DC | PRN

## 2019-11-06 MED ORDER — HYDROMORPHONE HCL 1 MG/ML IJ SOLN
0.5000 mg | INTRAMUSCULAR | Status: DC | PRN
  Administered 2019-11-06 (×2): 0.5 mg via INTRAVENOUS
  Filled 2019-11-06 (×2): qty 1

## 2019-11-06 MED ORDER — TRAZODONE HCL 50 MG PO TABS
50.0000 mg | ORAL_TABLET | Freq: Every evening | ORAL | Status: DC | PRN
  Administered 2019-11-08 – 2019-11-11 (×3): 50 mg via ORAL
  Filled 2019-11-06 (×3): qty 1

## 2019-11-06 MED ORDER — PROMETHAZINE HCL 25 MG/ML IJ SOLN
12.5000 mg | Freq: Once | INTRAMUSCULAR | Status: AC
  Administered 2019-11-06: 12.5 mg via INTRAVENOUS
  Filled 2019-11-06: qty 1

## 2019-11-06 MED ORDER — HYDRALAZINE HCL 20 MG/ML IJ SOLN
5.0000 mg | Freq: Four times a day (QID) | INTRAMUSCULAR | Status: DC | PRN
  Administered 2019-11-06: 5 mg via INTRAVENOUS
  Filled 2019-11-06 (×2): qty 1

## 2019-11-06 MED ORDER — POTASSIUM CHLORIDE CRYS ER 20 MEQ PO TBCR
40.0000 meq | EXTENDED_RELEASE_TABLET | Freq: Once | ORAL | Status: AC
  Administered 2019-11-06: 40 meq via ORAL
  Filled 2019-11-06: qty 2

## 2019-11-06 MED ORDER — OXYCODONE-ACETAMINOPHEN 5-325 MG PO TABS
1.0000 | ORAL_TABLET | Freq: Four times a day (QID) | ORAL | Status: DC | PRN
  Administered 2019-11-06 – 2019-11-09 (×6): 1 via ORAL
  Filled 2019-11-06 (×6): qty 1

## 2019-11-06 MED ORDER — PANTOPRAZOLE SODIUM 40 MG PO TBEC
40.0000 mg | DELAYED_RELEASE_TABLET | Freq: Every day | ORAL | Status: DC
  Administered 2019-11-06 – 2019-11-12 (×6): 40 mg via ORAL
  Filled 2019-11-06 (×8): qty 1

## 2019-11-06 MED ORDER — POTASSIUM CHLORIDE IN NACL 20-0.9 MEQ/L-% IV SOLN
INTRAVENOUS | Status: AC
  Administered 2019-11-06: 05:00:00 via INTRAVENOUS
  Filled 2019-11-06: qty 1000

## 2019-11-06 MED ORDER — POTASSIUM CHLORIDE CRYS ER 20 MEQ PO TBCR
30.0000 meq | EXTENDED_RELEASE_TABLET | Freq: Once | ORAL | Status: AC
  Administered 2019-11-06: 30 meq via ORAL
  Filled 2019-11-06: qty 1

## 2019-11-06 MED ORDER — LIDOCAINE HCL 1 % IJ SOLN
INTRAMUSCULAR | Status: AC
  Administered 2019-11-06: 20 mL
  Filled 2019-11-06: qty 20

## 2019-11-06 MED ORDER — OXYCODONE-ACETAMINOPHEN 5-325 MG PO TABS
1.0000 | ORAL_TABLET | Freq: Once | ORAL | Status: AC
  Administered 2019-11-06: 1 via ORAL
  Filled 2019-11-06: qty 1

## 2019-11-06 MED ORDER — FENTANYL CITRATE (PF) 100 MCG/2ML IJ SOLN
100.0000 ug | Freq: Once | INTRAMUSCULAR | Status: AC
  Administered 2019-11-06: 100 ug via INTRAVENOUS
  Filled 2019-11-06: qty 2

## 2019-11-06 MED ORDER — ONDANSETRON HCL 4 MG/2ML IJ SOLN
4.0000 mg | Freq: Once | INTRAMUSCULAR | Status: AC
  Administered 2019-11-06: 4 mg via INTRAVENOUS
  Filled 2019-11-06: qty 2

## 2019-11-06 MED ORDER — ATORVASTATIN CALCIUM 10 MG PO TABS
10.0000 mg | ORAL_TABLET | Freq: Every day | ORAL | Status: DC
  Administered 2019-11-06 – 2019-11-11 (×6): 10 mg via ORAL
  Filled 2019-11-06 (×7): qty 1

## 2019-11-06 MED ORDER — HYDROMORPHONE HCL 1 MG/ML IJ SOLN
0.5000 mg | Freq: Once | INTRAMUSCULAR | Status: AC
  Administered 2019-11-06: 0.5 mg via INTRAVENOUS
  Filled 2019-11-06: qty 1

## 2019-11-06 MED ORDER — MORPHINE SULFATE (PF) 4 MG/ML IV SOLN
4.0000 mg | Freq: Once | INTRAVENOUS | Status: AC
  Administered 2019-11-06: 4 mg via INTRAVENOUS
  Filled 2019-11-06: qty 1

## 2019-11-06 MED ORDER — BISOPROLOL-HYDROCHLOROTHIAZIDE 10-6.25 MG PO TABS
1.0000 | ORAL_TABLET | Freq: Every day | ORAL | Status: DC
  Administered 2019-11-07: 1 via ORAL
  Filled 2019-11-06 (×2): qty 1

## 2019-11-06 MED ORDER — FENTANYL CITRATE (PF) 100 MCG/2ML IJ SOLN
25.0000 ug | INTRAMUSCULAR | Status: DC | PRN
  Administered 2019-11-06 – 2019-11-10 (×10): 50 ug via INTRAVENOUS
  Administered 2019-11-10: 200 ug via INTRAVENOUS
  Administered 2019-11-10 (×2): 50 ug via INTRAVENOUS
  Filled 2019-11-06 (×6): qty 2

## 2019-11-06 MED ORDER — ONDANSETRON HCL 4 MG/2ML IJ SOLN
4.0000 mg | Freq: Four times a day (QID) | INTRAMUSCULAR | Status: DC | PRN
  Administered 2019-11-06 – 2019-11-09 (×6): 4 mg via INTRAVENOUS
  Filled 2019-11-06 (×6): qty 2

## 2019-11-06 MED ORDER — PROMETHAZINE HCL 25 MG PO TABS
12.5000 mg | ORAL_TABLET | Freq: Four times a day (QID) | ORAL | Status: DC | PRN
  Administered 2019-11-06: 12.5 mg via ORAL
  Filled 2019-11-06: qty 1

## 2019-11-06 MED ORDER — ENOXAPARIN SODIUM 40 MG/0.4ML ~~LOC~~ SOLN
40.0000 mg | Freq: Every day | SUBCUTANEOUS | Status: DC
  Filled 2019-11-06 (×3): qty 0.4

## 2019-11-06 MED ORDER — ACETAMINOPHEN 325 MG PO TABS: 650.0000 mg | ORAL_TABLET | Freq: Four times a day (QID) | ORAL | Status: DC | PRN

## 2019-11-06 MED ORDER — BISOPROLOL-HYDROCHLOROTHIAZIDE 10-6.25 MG PO TABS: 1.0000 | ORAL_TABLET | Freq: Every day | ORAL | Status: DC

## 2019-11-06 NOTE — H&P (Addendum)
[facility] H&P    Patient Demographics:    Ray [name], is a 58 y.o. male  MRN: [id number]  DOB - 1961-11-15  Admit Date - 11/05/2019  Referring MD/NP/PA:  Elpidio AnisShari Upstill  Outpatient Primary MD for the patient is Mirna MiresHill, Gerald, MD  Patient coming from:  work  Chief complaint-   Shoulder pain (left)   HPI:    Ray [name]  is a 58 y.o. male, w hx of spontaneous R pneumothorax was apparently lifting water at work when felt a sharp pain in the left shoulder/ chest area, reminded him of prior pneumothorax pain.  pt therefore presented to ED. pt noted sob when arrived in ED.  Pt denies fever, chills, cough, palp, n/v, diarrhea, brbpr.   In ED,  Temperature 98.5 pulse 87 respiratory rate 18 blood pressure 126/90 pulse ox 99% on room air  CXR IMPRESSION: Moderate left pneumothorax, slightly enlarged from the prior exam now with slight rightward mediastinal shift.  These results were called by telephone at the time of interpretation on 11/06/2019 at 1:17 am to provider Gomez CleverlySherri Utsill PA, who verbally acknowledged these results.  WBC 8.2, hemoglobin 12.4, platelet count 305 Sodium 140, potassium 2.9, BUN 7, creatinine 1.09 INR 0.9  ED spoke with Dr. Birl SageGerhardt who [facility] by hospitalist service and will consult on the patient.  Pt will be admitted for L spontaneous pneumothorax   Review of systems:    In addition to the HPI above,  No Fever-chills, No Headache, No changes with Vision or hearing, No problems swallowing food or Liquids, No Cough No Abdominal pain, No Nausea or Vomiting, bowel movements are regular, No Blood in stool or Urine, No dysuria, No new skin rashes or bruises, No new joints pains-aches,  No new weakness, tingling, numbness in any extremity, No recent weight gain or loss, No polyuria, polydypsia or polyphagia, No significant Mental Stressors.  All other  systems reviewed and are negative.    Past History of the following :    Past Medical History:  Diagnosis Date  . Atelectasis    r/side  . Hypertension       Past Surgical History:  Procedure Laterality Date  . COLONOSCOPY     done at age 58 per pt and was told to have it in 10 years again  . STAPLING OF BLEBS Right 12/20/2017   Procedure: STAPLING OF BLEBS;  Surgeon: Delight OvensGerhardt, Edward B, MD;  [facility];  Service: Thoracic;  Laterality: Right;  Marland Kitchen. VIDEO ASSISTED THORACOSCOPY Right 12/20/2017   Procedure: VIDEO ASSISTED THORACOSCOPY;  Surgeon: Delight OvensGerhardt, Edward B, MD;  [facility];  Service: Thoracic;  Laterality: Right;  Marland Kitchen. VIDEO BRONCHOSCOPY N/A 12/20/2017   Procedure: VIDEO BRONCHOSCOPY;  Surgeon: Delight OvensGerhardt, Edward B, MD;  [facility];  Service: Thoracic;  Laterality: N/A;      Social History:      Social History   Tobacco Use  . Smoking status: Former Games developermoker  . Smokeless tobacco: Never Used  . Tobacco comment: quit 20 years ago  Substance Use Topics  . Alcohol use: Yes    Comment: pt reports 1 beer daily       Family History :     Family History  Problem Relation Age of Onset  . Hypertension Mother   . Colon cancer Neg Hx   . Esophageal cancer Neg Hx       Home Medications:   Prior to Admission medications   Medication Sig Start Date End Date Taking? Authorizing Provider  atorvastatin (LIPITOR) 10 MG tablet Take 10 mg by mouth at bedtime. 11/02/19  Yes [provider]  bisoprolol-hydrochlorothiazide (ZIAC) 10-6.25 MG per tablet Take 1 tablet by mouth daily. 12/23/13  Yes Nelva Nay, MD  losartan (COZAAR) 100 MG tablet Take 100 mg by mouth every morning. 02/16/17  Yes [provider]  pantoprazole (PROTONIX) 40 MG tablet TAKE 1 TABLET BY MOUTH EVERY DAY Patient taking differently: Take 40 mg by mouth daily.  11/02/19  Yes Lynann Bologna, MD  traZODone (DESYREL) 50 MG tablet Take 50 mg by mouth at bedtime as needed for sleep.   01/29/17  Yes [provider]     Allergies:     Allergies  Allergen Reactions  . Asa [Aspirin] Nausea Only     Physical Exam:   Vitals  Blood pressure (!) 153/83, pulse (!) 104, temperature 98.5 F (36.9 C), temperature source Oral, resp. rate 18, SpO2 96 %.  1.  General: Alert and oriented x3  2. Psychiatric: Euthymic  3. Neurologic: Cranial nerves II through XII intact, reflexes 2+, symmetric, diffuse with no clonus, motor 5/5 in all 4 extremities  4. HEENMT:  Anicteric, pupils 1.5 mm, symmetric, direct, consensual reflexes intact  neck no JVD  5. Respiratory : No crackles, no wheeze  6. Cardiovascular : Tacky, S1-S2, no murmurs gallops or rubs  7. Gastrointestinal:  Demented: Soft, nontender, nondistended, positive bowel sounds  8. Skin:  Extremities: No cyanosis clubbing or edema  9.Musculoskeletal:  Good range of motion    Data Review:    CBC Recent Labs  Lab 11/06/19 0151  WBC 8.2  HGB 12.4*  HCT 37.9*  PLT 305  MCV 99.2  MCH 32.5  MCHC 32.7  RDW 13.4  LYMPHSABS 1.2  MONOABS 0.8  EOSABS 0.0  BASOSABS 0.1   ------------------------------------------------------------------------------------------------------------------  Results for orders placed or performed during the hospital encounter of 11/05/19 (from the past 48 hour(s))  CBC with Differential     Status: Abnormal   Collection Time: 11/06/19  1:51 AM  Result Value Ref Range   WBC 8.2 4.0 - 10.5 K/uL   RBC 3.82 (L) 4.22 - 5.81 MIL/uL   Hemoglobin 12.4 (L) 13.0 - 17.0 g/dL   HCT 22.0 (L) 25.4 - 27.0 %   MCV 99.2 80.0 - 100.0 fL   MCH 32.5 26.0 - 34.0 pg   MCHC 32.7 30.0 - 36.0 g/dL   RDW 62.3 76.2 - 83.1 %   Platelets 305 150 - 400 K/uL   nRBC 0.0 0.0 - 0.2 %   Neutrophils Relative % 74 %   Neutro Abs 6.1 1.7 - 7.7 K/uL   Lymphocytes Relative 15 %   Lymphs Abs 1.2 0.7 - 4.0 K/uL   Monocytes Relative 10 %   Monocytes Absolute 0.8 0.1 - 1.0 K/uL   Eosinophils  Relative 0 %   Eosinophils Absolute 0.0 0.0 - 0.5 K/uL   Basophils Relative 1 %   Basophils Absolute 0.1 0.0 - 0.1 K/uL   Immature Granulocytes 0 %  Abs Immature Granulocytes 0.03 0.00 - 0.07 K/uL    Comment: Performed at [facility], [place] [address] [place], [place] [address]  Basic metabolic panel     Status: Abnormal   Collection Time: 11/06/19  1:51 AM  Result Value Ref Range   Sodium 140 135 - 145 mmol/L   Potassium 2.9 (L) 3.5 - 5.1 mmol/L   Chloride 100 98 - 111 mmol/L   CO2 23 22 - 32 mmol/L   Glucose, Bld 124 (H) 70 - 99 mg/dL   BUN 7 6 - 20 mg/dL   Creatinine, Ser 1.09 0.61 - 1.24 mg/dL   Calcium 9.1 8.9 - 10.3 mg/dL   GFR calc non Af Amer >60 >60 mL/min   GFR calc Af Amer >60 >60 mL/min   Anion gap 17 (H) 5 - 15    Comment: Performed at [facility], [place] [address] [place], [place] [address]  Protime-INR     Status: None   Collection Time: 11/06/19  1:51 AM  Result Value Ref Range   Prothrombin Time 12.1 11.4 - 15.2 seconds   INR 0.9 0.8 - 1.2    Comment: (NOTE) INR goal varies based on device and disease states. Performed at [facility], [place] [address], [place], [place] [address]     Chemistries  Recent Labs  Lab 11/06/19 0151  NA 140  K 2.9*  CL 100  CO2 23  GLUCOSE 124*  BUN 7  CREATININE 1.09  CALCIUM 9.1   ------------------------------------------------------------------------------------------------------------------  ------------------------------------------------------------------------------------------------------------------ GFR: CrCl cannot be calculated (Unknown ideal weight.). Liver Function Tests: No results for input(s): AST, ALT, ALKPHOS, BILITOT, PROT, ALBUMIN in the last 168 hours. No results for input(s): LIPASE, AMYLASE in the last 168 hours. No results for input(s): AMMONIA in the last 168 hours. Coagulation Profile: Recent Labs  Lab 11/06/19 0151  INR 0.9    Cardiac Enzymes: No results for input(s): CKTOTAL, CKMB, CKMBINDEX, TROPONINI in the last 168 hours. BNP (last 3 results) No results for input(s): PROBNP in the last 8760 hours. HbA1C: No results for input(s): HGBA1C in the last 72 hours. CBG: No results for input(s): GLUCAP in the last 168 hours. Lipid Profile: No results for input(s): CHOL, HDL, LDLCALC, TRIG, CHOLHDL, LDLDIRECT in the last 72 hours. Thyroid Function Tests: No results for input(s): TSH, T4TOTAL, FREET4, T3FREE, THYROIDAB in the last 72 hours. Anemia Panel: No results for input(s): VITAMINB12, FOLATE, FERRITIN, TIBC, IRON, RETICCTPCT in the last 72 hours.  --------------------------------------------------------------------------------------------------------------- Urine analysis:    Component Value Date/Time   COLORURINE YELLOW 09/11/2018 [place] 09/11/2018 1519   LABSPEC 1.011 09/11/2018 1519   PHURINE 6.0 09/11/2018 1519   GLUCOSEU NEGATIVE 09/11/2018 1519   HGBUR NEGATIVE 09/11/2018 1519   BILIRUBINUR NEGATIVE 09/11/2018 1519   KETONESUR 5 (A) 09/11/2018 1519   PROTEINUR NEGATIVE 09/11/2018 1519   UROBILINOGEN 0.2 12/23/2013 1646   NITRITE NEGATIVE 09/11/2018 1519   LEUKOCYTESUR NEGATIVE 09/11/2018 1519      Imaging Results:    Dg Chest 2 View  Result Date: 11/06/2019 CLINICAL DATA:  Pneumothorax EXAM: CHEST - 2 VIEW COMPARISON:  November 05, 2019 FINDINGS: There is slight interval enlargement of the left-sided pneumothorax with slight rightward mediastinal shift. The right lung is clear. No acute osseous abnormality. IMPRESSION: Moderate left pneumothorax, slightly enlarged from the prior exam now with slight rightward mediastinal shift. These results were called by telephone at the time of interpretation on 11/06/2019 at 1:17 am  to provider Gomez Cleverly PA, who verbally acknowledged these results. Electronically Signed   By: Jonna Clark M.D.   On: 11/06/2019 01:28   Dg Chest 2  View  Result Date: 11/05/2019 CLINICAL DATA:  Pneumothorax follow-up. EXAM: CHEST - 2 VIEW COMPARISON:  Chest x-ray from same day at 6:35 p.m. FINDINGS: The heart size and mediastinal contours are within normal limits. Normal pulmonary vascularity. Unchanged small left pneumothorax. Postsurgical changes in the right lung apex again noted. No consolidation or pleural effusion. No acute osseous abnormality. IMPRESSION: 1. Unchanged small left pneumothorax. Electronically Signed   By: Obie Dredge M.D.   On: 11/05/2019 20:40   Dg Chest 2 View  Result Date: 11/05/2019 CLINICAL DATA:  Left shoulder blade pain. History of right-sided lung collapse. EXAM: CHEST - 2 VIEW COMPARISON:  September 11, 2018 FINDINGS: There is a left-sided pneumothorax extending from apex towards base measuring 11 mm laterally and 17 mm at the apex. No shift to suggest tension pneumothorax. Postsurgical changes are seen in the right apex. No right-sided pneumothorax. Cardiomediastinal silhouette is stable. The lungs are clear. IMPRESSION: New left pneumothorax measuring 17 mm at the apex and 11 mm laterally with no evidence of tension. Findings called to the patient's PA, Terance Hart. Electronically Signed   By: Gerome Sam III M.D   On: 11/05/2019 18:47       Assessment & Plan:    Principal Problem:   Pneumothorax Active Problems:   Hypertension   Hypokalemia  L spontaneous pneumothorax Chest tube Dilaudid 0.5mg  iv q4h prn pain CT surgery consulted by ED, appreciate input  Hypokalemia Replete Check cmp in  a.m.  Hypertension Continue losartan 100 mg daily Continue bisoprolol-hydrochlorothiazide 10-6.25 mg daily Hydralazine 5 mg IV every 6 hours as needed systolic blood pressure greater than 160  Hyperlipidemia Continue Lipitor 10 mg daily  GERD Continue Protonix 40 mg daily  Insomnia Continue trazodone 50 mg at night as needed  Anemia Check cbc in am   DVT Prophylaxis-   Lovenox - SCDs   AM  Labs Ordered, also please review Full Orders  Family Communication: Admission, patients condition and plan of care including tests being ordered have been discussed with the patient  who indicate understanding and agree with the plan and Code Status.  Code Status:  FULL CODE per patient,  Pt accompanied by family.   Admission status: Inpatient: Based on patients clinical presentation and evaluation of above clinical data, I have made determination that patient meets Inpatient criteria at this time.  Pt requires chest tube for pneumothorax,  Pt has high risk of clinical deterioration, pt will require >2 nites stay.   Time spent in minutes : 70    Pearson Grippe M.D on 11/06/2019 at 4:53 AM

## 2019-11-06 NOTE — ED Notes (Signed)
Pt disgruntled when this RN informed him that he would not be able to have his girlfriend as a visitor while in the ED due to his daughter already having visited him earlier this morning.  Pt informed of [facility] visitation policy and the necessary precautions needing to be taken to decrease spread of germs and illness.

## 2019-11-06 NOTE — ED Notes (Signed)
Patient transported to X-ray 

## 2019-11-06 NOTE — ED Notes (Signed)
Pt placed on 2L nasal cannula.

## 2019-11-06 NOTE — ED Notes (Signed)
[facility] notified of need for transport. °

## 2019-11-06 NOTE — ED Provider Notes (Signed)
  Physical Exam  BP (!) 166/92   Pulse (!) 118   Temp 98.5 F (36.9 C) (Oral)   Resp (!) 35   SpO2 91%   Physical Exam Vitals signs reviewed.  Cardiovascular:     Rate and Rhythm: Tachycardia present.  Pulmonary:     Effort: Tachypnea present.     Breath sounds: Examination of the left-upper field reveals decreased breath sounds. Examination of the left-lower field reveals decreased breath sounds. Decreased breath sounds present.  Skin:    General: Skin is moist.     ED Course/Procedures     .Critical Care Performed by: Merrily Pew, MD Authorized by: Merrily Pew, MD   Critical care provider statement:    Critical care time (minutes):  45   Critical care time was exclusive of:  Separately billable procedures and treating other patients   Critical care was necessary to treat or prevent imminent or life-threatening deterioration of the following conditions:  Respiratory failure   Critical care was time spent personally by me on the following activities:  Discussions with consultants, evaluation of patient's response to treatment, examination of patient, ordering and performing treatments and interventions, ordering and review of laboratory studies, ordering and review of radiographic studies, pulse oximetry, re-evaluation of patient's condition, obtaining history from patient or surrogate and review of old charts CHEST TUBE INSERTION  Date/Time: 11/06/2019 7:13 AM Performed by: Merrily Pew, MD Authorized by: Merrily Pew, MD   Consent:    Consent obtained:  Verbal   Consent given by:  Patient   Risks discussed:  Bleeding, incomplete drainage and pain   Alternatives discussed:  Delayed treatment, no treatment, alternative treatment and observation Pre-procedure details:    Skin preparation:  Betadine   Preparation: Patient was prepped and draped in the usual sterile fashion   Anesthesia (see MAR for exact dosages):    Anesthesia method:  Local infiltration   Local  anesthetic:  Lidocaine 1% w/o epi Procedure details:    Placement location:  L lateral   Scalpel size:  11   Tube size (Fr):  12   Ultrasound guidance: no     Tension pneumothorax: yes     Tube connected to:  Suction and water seal   Drainage characteristics:  Air only   Suture material:  0 silk   Dressing:  Petrolatum-impregnated gauze and 4x4 sterile gauze Post-procedure details:    Post-insertion x-ray findings: tube in good position     Patient tolerance of procedure:  Tolerated well, no immediate complications    MDM  Recurrent pneumothorax is worsening on serial x-rays with what appears to be the beginnings of tension on xr and HR elevated.  Chest tube placed as above.  Had some nausea with that but overall improved afterwards.  X-ray rib viewed by myself and proved pneumothorax.  Tube is not in the best position but seems to be working.  Plan for admission.       Merrily Pew, MD 11/06/19 [phone]

## 2019-11-06 NOTE — ED Notes (Signed)
Pt requesting pain medication.  When this RN offered the PRN dilaudid that was ordered for him, pt reported that this medication made him nauseas earlier and he wanted another pain medication.  Pt requesting fentanyl.  Pt reports that morphine also makes him nauseas.  Admitting provider made aware.

## 2019-11-06 NOTE — Progress Notes (Signed)
Patient ID: Ray [name], male   DOB: 1961/11/16, 58 y.o.   MRN: [id number] Patient waiting for transfer to [facility] Chest xray with chest tube in place shows good position and lung inflated   Will follow for chest tube management when [facility]  Grace Isaac MD      [address] [place] [address] Office [phone]       Dg Chest 2 View  Result Date: 11/06/2019 CLINICAL DATA:  Pneumothorax EXAM: CHEST - 2 VIEW COMPARISON:  November 05, 2019 FINDINGS: There is slight interval enlargement of the left-sided pneumothorax with slight rightward mediastinal shift. The right lung is clear. No acute osseous abnormality. IMPRESSION: Moderate left pneumothorax, slightly enlarged from the prior exam now with slight rightward mediastinal shift. These results were called by telephone at the time of interpretation on 11/06/2019 at 1:17 am to provider Peter Minium PA, who verbally acknowledged these results. Electronically Signed   By: Prudencio Pair M.D.   On: 11/06/2019 01:28   Dg Chest 2 View  Result Date: 11/05/2019 CLINICAL DATA:  Pneumothorax follow-up. EXAM: CHEST - 2 VIEW COMPARISON:  Chest x-ray from same day at 6:35 p.m. FINDINGS: The heart size and mediastinal contours are within normal limits. Normal pulmonary vascularity. Unchanged small left pneumothorax. Postsurgical changes in the right lung apex again noted. No consolidation or pleural effusion. No acute osseous abnormality. IMPRESSION: 1. Unchanged small left pneumothorax. Electronically Signed   By: Titus Dubin M.D.   On: 11/05/2019 20:40   Dg Chest Portable 1 View  Result Date: 11/06/2019 CLINICAL DATA:  Pneumothorax. Status post chest tube placement. EXAM: PORTABLE CHEST 1 VIEW COMPARISON:  One-view chest x-ray 11/06/2019 FINDINGS: Heart size is normal. Left-sided chest tube is in place. The left lung is re-expanded. A tiny residual pneumothorax is visible at the apex. The right lung is clear.  Degenerative changes are noted at the shoulders. IMPRESSION: 1. Tiny residual pneumothorax at the apex. 2. Left-sided chest tube in place. 3. The right lung is clear. Electronically Signed   By: San Morelle M.D.   On: 11/06/2019 05:11

## 2019-11-06 NOTE — ED Notes (Signed)
Pt alert, able to speak in full sentences. C/o nausea/vomiting/ pain in left side at site of chest tube insertion.  Chest tube tubing secured to bed. Pts family at bedside.

## 2019-11-06 NOTE — ED Notes (Signed)
Pt provided with ginger ale, per his request.

## 2019-11-06 NOTE — ED Provider Notes (Signed)
Here with PTX, spontaneous  Plan: 1:00 repeat CXR - if no bigger can d/ch home, f/u [facility].  Per radiology call, PTX is larger on left and now causing rightward mediastinal shift.   Recheck of the patient. Pain is managed. He has continuous nausea and cough. O2 saturations are 94%, heart rate sometimes tachy to low 100's.  Dr. Servando Snare paged with repeat imaging results. Labs, IV ordered. The patient will need a chest tube placed. Will discuss preferred COVID testing with Dr. Servando Snare. Plan for admission.  Per Dr. Servando Snare, chest tube placed by Dr. Dayna Barker. Hospitalist to admit, transfer to [facility] where Dr. Servando Snare will follow in consultation. Discussed admission with Dr. Maudie Mercury of [facility], who accepts the patient for admission. Will transfer.   Charlann Lange, PA-C 11/06/19 5102    Merrily Pew, MD 11/06/19 [phone]

## 2019-11-06 NOTE — Progress Notes (Signed)
PROGRESS NOTE                                                                                                                                                                                                             Patient Demographics:    Ray [name], is a 58 y.o. male, DOB - 11/20/1961, ZOX:[id number]  Admit date - 11/05/2019   Admitting Physician No admitting provider for patient encounter.  Outpatient Primary MD for the patient is Iona Beard, MD  LOS - 0   Chief Complaint  Patient presents with  . Shortness of Breath       Brief Narrative    This is in a charge note as patient was seen and admitted earlier today by Dr. Jani Gravel, chart, labs and imaging were reviewed  58 y.o. male, w hx of spontaneous R pneumothorax was apparently lifting water at work when felt a sharp pain in the left shoulder/ chest area, reminded him of prior pneumothorax pain.  pt therefore presented to ED. pt noted sob when arrived in ED.  Pt denies fever, chills, cough, palp, n/v, diarrhea, brbpr.  His chest x-ray significant for pneumothorax, where he had chest tube inserted in ED, and currently pending transfer to [facility] for CT surgery evaluation for chest tube management.    Subjective:    Ray [name] today had some nausea earlier today which has resolved, reports some pain at the chest tube insertion site.   Assessment  & Plan :    Principal Problem:   Pneumothorax Active Problems:   Hypertension   Hypokalemia  Left spontaneous pneumothorax -Patient with known history of recurrent pneumothorax, right chest pneumothorax x2, this is first left side pneumothorax. -Chest tube inserted in ED, chest x-ray showing good positioning and significant improvement of pneumothorax, currently on low intermittent suction. -CT consulted for chest tube management, awaiting transfer to [facility] for official evaluation. -Continue with as needed Dilaudid  for pain  Hypokalemia Repleted, recheck in a.m. Check cmp in  a.m.  Hypertension Continue losartan 100 mg daily Continue bisoprolol-hydrochlorothiazide 10-6.25 mg daily Hydralazine 5 mg IV every 6 hours as needed systolic blood pressure greater than 160  Hyperlipidemia Continue Lipitor 10 mg daily  GERD Continue Protonix 40 mg daily  Insomnia Continue trazodone 50 mg at night as needed   Code Status :  Full  Family Communication  : D/W patient  Disposition Plan  : Home  Barriers For Discharge : Has chest tube, remains with pneumothorax  Consults  : CT surgery  Procedures  : Chest tube insertion in ED  DVT Prophylaxis  : SCD  Lab Results  Component Value Date   PLT 305 11/06/2019    Antibiotics  :    Anti-infectives (From admission, onward)   None        Objective:   Vitals:   11/06/19 0700 11/06/19 0800 11/06/19 0900 11/06/19 1000  BP: (!) 159/82 (!) 143/77 (!) 147/96 137/80  Pulse: (!) 103 96 96 93  Resp: 19 19 19 14   Temp:      TempSrc:      SpO2: 97% 98% 97% 99%    Wt Readings from Last 3 Encounters:  11/25/18 68.5 kg  10/16/18 68.6 kg  09/11/18 70.3 kg    No intake or output data in the 24 hours ending 11/06/19 1117   Physical Exam  Awake Alert, Oriented X 3, No new F.N deficits, Normal affect Symmetrical Chest wall movement, Good air movement bilaterally, some rales in the left lung base RRR,No Gallops,Rubs or new Murmurs, No Parasternal Heave +ve B.Sounds, Abd Soft, No tenderness,  No rebound - guarding or rigidity. No Cyanosis, Clubbing or edema, No new Rash or bruise      Data Review:    CBC Recent Labs  Lab 11/06/19 0151  WBC 8.2  HGB 12.4*  HCT 37.9*  PLT 305  MCV 99.2  MCH 32.5  MCHC 32.7  RDW 13.4  LYMPHSABS 1.2  MONOABS 0.8  EOSABS 0.0  BASOSABS 0.1    Chemistries  Recent Labs  Lab 11/06/19 0151  NA 140  K 2.9*  CL 100  CO2 23  GLUCOSE 124*  BUN 7  CREATININE 1.09  CALCIUM 9.1    ------------------------------------------------------------------------------------------------------------------ No results for input(s): CHOL, HDL, LDLCALC, TRIG, CHOLHDL, LDLDIRECT in the last 72 hours.  No results found for: HGBA1C ------------------------------------------------------------------------------------------------------------------ No results for input(s): TSH, T4TOTAL, T3FREE, THYROIDAB in the last 72 hours.  Invalid input(s): FREET3 ------------------------------------------------------------------------------------------------------------------ No results for input(s): VITAMINB12, FOLATE, FERRITIN, TIBC, IRON, RETICCTPCT in the last 72 hours.  Coagulation profile Recent Labs  Lab 11/06/19 0151  INR 0.9    No results for input(s): DDIMER in the last 72 hours.  Cardiac Enzymes No results for input(s): CKMB, TROPONINI, MYOGLOBIN in the last 168 hours.  Invalid input(s): CK ------------------------------------------------------------------------------------------------------------------ No results found for: BNP  Inpatient Medications  Scheduled Meds: . atorvastatin  10 mg Oral QHS  . bisoprolol-hydrochlorothiazide  1 tablet Oral Daily  . enoxaparin (LOVENOX) injection  40 mg Subcutaneous Daily  . losartan  100 mg Oral Daily  . ondansetron (ZOFRAN) IV  4 mg Intravenous Once  . pantoprazole  40 mg Oral Daily   Continuous Infusions: . 0.9 % NaCl with KCl 20 mEq / L 75 mL/hr at 11/06/19 0438   PRN Meds:.acetaminophen **OR** acetaminophen, diphenhydrAMINE, hydrALAZINE, HYDROmorphone (DILAUDID) injection, ondansetron (ZOFRAN) IV, traZODone  Micro Results No results found for this or any previous visit (from the past 240 hour(s)).  Radiology Reports Dg Chest 2 View  Result Date: 11/06/2019 CLINICAL DATA:  Pneumothorax EXAM: CHEST - 2 VIEW COMPARISON:  November 05, 2019 FINDINGS: There is slight interval enlargement of the left-sided pneumothorax with  slight rightward mediastinal shift. The right lung is clear. No acute osseous abnormality. IMPRESSION: Moderate left pneumothorax, slightly enlarged from the prior exam now with  slight rightward mediastinal shift. These results were called by telephone at the time of interpretation on 11/06/2019 at 1:17 am to provider Gomez Cleverly PA, who verbally acknowledged these results. Electronically Signed   By: Jonna Clark M.D.   On: 11/06/2019 01:28   Dg Chest 2 View  Result Date: 11/05/2019 CLINICAL DATA:  Pneumothorax follow-up. EXAM: CHEST - 2 VIEW COMPARISON:  Chest x-ray from same day at 6:35 p.m. FINDINGS: The heart size and mediastinal contours are within normal limits. Normal pulmonary vascularity. Unchanged small left pneumothorax. Postsurgical changes in the right lung apex again noted. No consolidation or pleural effusion. No acute osseous abnormality. IMPRESSION: 1. Unchanged small left pneumothorax. Electronically Signed   By: Obie Dredge M.D.   On: 11/05/2019 20:40   Dg Chest 2 View  Result Date: 11/05/2019 CLINICAL DATA:  Left shoulder blade pain. History of right-sided lung collapse. EXAM: CHEST - 2 VIEW COMPARISON:  September 11, 2018 FINDINGS: There is a left-sided pneumothorax extending from apex towards base measuring 11 mm laterally and 17 mm at the apex. No shift to suggest tension pneumothorax. Postsurgical changes are seen in the right apex. No right-sided pneumothorax. Cardiomediastinal silhouette is stable. The lungs are clear. IMPRESSION: New left pneumothorax measuring 17 mm at the apex and 11 mm laterally with no evidence of tension. Findings called to the patient's PA, Terance Hart. Electronically Signed   By: Gerome Sam III M.D   On: 11/05/2019 18:47   Dg Chest Portable 1 View  Result Date: 11/06/2019 CLINICAL DATA:  Pneumothorax. Status post chest tube placement. EXAM: PORTABLE CHEST 1 VIEW COMPARISON:  One-view chest x-ray 11/06/2019 FINDINGS: Heart size is normal.  Left-sided chest tube is in place. The left lung is re-expanded. A tiny residual pneumothorax is visible at the apex. The right lung is clear. Degenerative changes are noted at the shoulders. IMPRESSION: 1. Tiny residual pneumothorax at the apex. 2. Left-sided chest tube in place. 3. The right lung is clear. Electronically Signed   By: Marin Roberts M.D.   On: 11/06/2019 05:11    Huey Bienenstock M.D on 11/06/2019 at 11:17 AM  Between 7am to 7pm - Pager - [phone]  After 7pm go to [email or web address] - password [facility]  [facility] -  Office  [phone]

## 2019-11-07 ENCOUNTER — Inpatient Hospital Stay (HOSPITAL_COMMUNITY): Payer: 59

## 2019-11-07 LAB — RENAL FUNCTION PANEL
Albumin: 3.2 g/dL — ABNORMAL LOW (ref 3.5–5.0)
Anion gap: 8 (ref 5–15)
BUN: 5 mg/dL — ABNORMAL LOW (ref 6–20)
CO2: 27 mmol/L (ref 22–32)
Calcium: 8.4 mg/dL — ABNORMAL LOW (ref 8.9–10.3)
Chloride: 103 mmol/L (ref 98–111)
Creatinine, Ser: 1 mg/dL (ref 0.61–1.24)
GFR calc Af Amer: >60 mL/min (ref >60)
GFR calc non Af Amer: >60 mL/min (ref >60)
Glucose, Bld: 142 mg/dL — ABNORMAL HIGH (ref 70–99)
Phosphorus: 1.6 mg/dL — ABNORMAL LOW (ref 2.5–4.6)
Potassium: 3 mmol/L — ABNORMAL LOW (ref 3.5–5.1)
Sodium: 138 mmol/L (ref 135–145)

## 2019-11-07 LAB — MAGNESIUM: Magnesium: 1.5 mg/dL — ABNORMAL LOW (ref 1.7–2.4)

## 2019-11-07 MED ORDER — POTASSIUM CHLORIDE CRYS ER 20 MEQ PO TBCR
40.0000 meq | EXTENDED_RELEASE_TABLET | ORAL | Status: AC
  Administered 2019-11-07 (×2): 40 meq via ORAL
  Filled 2019-11-07 (×2): qty 2

## 2019-11-07 MED ORDER — LISINOPRIL 20 MG PO TABS
20.0000 mg | ORAL_TABLET | Freq: Every day | ORAL | Status: DC
  Administered 2019-11-08: 20 mg via ORAL
  Filled 2019-11-07: qty 1

## 2019-11-07 MED ORDER — MAGNESIUM SULFATE 2 GM/50ML IV SOLN
2.0000 g | Freq: Once | INTRAVENOUS | Status: AC
  Administered 2019-11-07: 2 g via INTRAVENOUS
  Filled 2019-11-07: qty 50

## 2019-11-07 MED ORDER — AMLODIPINE BESYLATE 10 MG PO TABS
10.0000 mg | ORAL_TABLET | Freq: Every day | ORAL | Status: DC
  Administered 2019-11-07 – 2019-11-09 (×3): 10 mg via ORAL
  Filled 2019-11-07 (×3): qty 1

## 2019-11-07 NOTE — Progress Notes (Signed)
PROGRESS NOTE                                                                                                                                                                                                             Patient Demographics:    Ray [name], is a 58 y.o. male, DOB - 1961-08-15, VZD:[id number]  Admit date - 11/05/2019   Admitting Physician Pearson Grippe, MD  Outpatient Primary MD for the patient is Mirna Mires, MD  LOS - 1   Chief Complaint  Patient presents with  . Shortness of Breath       Brief Narrative   Patient is a 58 year old African-American male with past medical history significant for hypertension, atelectasis and prior history of pneumothorax (right-sided).  Patient was admitted with left shoulder and chest pain.  Work-up done revealed moderate left-sided pneumothorax.  Cardiothoracic surgery was consulted.  Pigtail catheter was inserted.  Patient's lung has re-expanded.  Cardiothoracic team is directing care.  The thoracic team plans left-sided chest tube to waterseal, as well as CT scan of the chest to assess for blebs on the left side.  11/07/2019: Patient seen alongside patient's nurse.  No new complaints.  No contusion no symptoms reported.  Last documented blood pressure was 164/105 mmHg.  We will continue to optimize blood pressure.  Goal blood pressure should be below 130/80 mmHg.   Subjective:    Ray [name] denies shortness of breath or chest pain.  No constitutional symptoms reported.   Assessment  & Plan :    Principal Problem:   Pneumothorax Active Problems:   Hypertension   Hypokalemia  Left spontaneous pneumothorax: -Kindly see above documentation.   -Patient has recurrent spontaneous pneumothorax.   -Current episode is left-sided, and patient had pigtail catheter inserted by the cardiothoracic team.   -Lung has reexpanded.   -Cardiothoracic team plans chest tube placement to water seal.  CT scan  of the chest is also planned to rule out right-sided blebs.   -Cardiothoracic team is directing care.   Hypokalemia -Patient has been on bisoprolol/HCTZ  -We will DC bisoprolol/HCTZ  -K-Dur 40 M EQ p.o. every 4 hours x2 doses -Magnesium was 1.5  -Will give IV magnesium 2 g x 1 dose.   -Repeat renal panel and magnesium in the morning.    Hypertension -Blood pressures not controlled.   -  We will also discontinue Cozaar  -We will start patient on lisinopril 20 mg p.o. once daily and Norvasc 10 mg p.o. once daily.   -Monitor blood pressure closely  -If hypokalemia persists, and blood pressure is not controlled, have low threshold to work patient up for primary aldosteronism.  Hyperlipidemia Continue Lipitor 10 mg daily  GERD Continue Protonix 40 mg daily  Insomnia Continue trazodone 50 mg at night as needed   Code Status : Full Family Communication  : D/W patient Disposition Plan  : Home when cleared for discharge by the cardiothoracic team. Barriers For Discharge : Has chest tube, remains with pneumothorax  Consults  : CT surgery  Procedures  : Pigtail catheter insertion.  Cardiothoracic team plans chest tube to waterseal.  DVT Prophylaxis  : SCD  Lab Results  Component Value Date   PLT 305 11/06/2019    Antibiotics  :    Anti-infectives (From admission, onward)   None        Objective:   Vitals:   11/06/19 2349 11/07/19 0411 11/07/19 0813 11/07/19 1108  BP: (!) 149/78 (!) 146/87 (!) 165/92 (!) 164/105  Pulse: 74 81 84 76  Resp: 16 18 (!) 22 (!) 22  Temp: 98.7 F (37.1 C) 98.5 F (36.9 C) 98.8 F (37.1 C) 99.1 F (37.3 C)  TempSrc: Oral Oral Oral Oral  SpO2: 100% 99% 100% 100%  Weight:  68.1 kg      Wt Readings from Last 3 Encounters:  11/07/19 68.1 kg  11/25/18 68.5 kg  10/16/18 68.6 kg     Intake/Output Summary (Last 24 hours) at 11/07/2019 1456 Last data filed at 11/07/2019 0900 Gross per 24 hour  Intake 630 ml  Output 200 ml  Net 430  ml     Physical Exam General: Patient is not in any distress.  Neck: Supple.  No raised JVD.  Lungs: Clear to auscultation.  Adequate air entry globally.  CVS: S1-S2.  Neuro: Nonfocal.  Patient moves all extremities.  Patient is awake and alert.  Abdomen: Soft and nontender.  No organomegaly appreciated.  Extremities: No leg edema.        Data Review:    CBC Recent Labs  Lab 11/06/19 0151  WBC 8.2  HGB 12.4*  HCT 37.9*  PLT 305  MCV 99.2  MCH 32.5  MCHC 32.7  RDW 13.4  LYMPHSABS 1.2  MONOABS 0.8  EOSABS 0.0  BASOSABS 0.1    Chemistries  Recent Labs  Lab 11/06/19 0151 11/07/19 0947  NA 140 138  K 2.9* 3.0*  CL 100 103  CO2 23 27  GLUCOSE 124* 142*  BUN 7 5*  CREATININE 1.09 1.00  CALCIUM 9.1 8.4*  MG  --  1.5*   ------------------------------------------------------------------------------------------------------------------ No results for input(s): CHOL, HDL, LDLCALC, TRIG, CHOLHDL, LDLDIRECT in the last 72 hours.  No results found for: HGBA1C ------------------------------------------------------------------------------------------------------------------ No results for input(s): TSH, T4TOTAL, T3FREE, THYROIDAB in the last 72 hours.  Invalid input(s): FREET3 ------------------------------------------------------------------------------------------------------------------ No results for input(s): VITAMINB12, FOLATE, FERRITIN, TIBC, IRON, RETICCTPCT in the last 72 hours.  Coagulation profile Recent Labs  Lab 11/06/19 0151  INR 0.9    No results for input(s): DDIMER in the last 72 hours.  Cardiac Enzymes No results for input(s): CKMB, TROPONINI, MYOGLOBIN in the last 168 hours.  Invalid input(s): CK ------------------------------------------------------------------------------------------------------------------ No results found for: BNP  Inpatient Medications  Scheduled Meds: . atorvastatin  10 mg Oral QHS  .  bisoprolol-hydrochlorothiazide  1 tablet Oral Daily  .  enoxaparin (LOVENOX) injection  40 mg Subcutaneous Daily  . losartan  100 mg Oral Daily  . ondansetron (ZOFRAN) IV  4 mg Intravenous Once  . pantoprazole  40 mg Oral Daily   Continuous Infusions:  PRN Meds:.acetaminophen **OR** acetaminophen, diphenhydrAMINE, fentaNYL (SUBLIMAZE) injection, hydrALAZINE, ondansetron (ZOFRAN) IV, oxyCODONE-acetaminophen, promethazine, traZODone  Micro Results Recent Results (from the past 240 hour(s))  SARS CORONAVIRUS 2 (TAT 6-24 HRS) Nasopharyngeal Nasopharyngeal Swab     Status: None   Collection Time: 11/06/19  2:14 AM   Specimen: Nasopharyngeal Swab  Result Value Ref Range Status   SARS Coronavirus 2 NEGATIVE NEGATIVE Final    Comment: (NOTE) SARS-CoV-2 target nucleic acids are NOT DETECTED. The SARS-CoV-2 RNA is generally detectable in upper and lower respiratory specimens during the acute phase of infection. Negative results do not preclude SARS-CoV-2 infection, do not rule out co-infections with other pathogens, and should not be used as the sole basis for treatment or other patient management decisions. Negative results must be combined with clinical observations, patient history, and epidemiological information. The expected result is Negative. Fact Sheet for Patients: HairSlick.no Fact Sheet for Healthcare Providers: quierodirigir.com This test is not yet approved or cleared by the [place] FDA and  has been authorized for detection and/or diagnosis of SARS-CoV-2 by FDA under an Emergency Use Authorization (EUA). This EUA will remain  in effect (meaning this test can be used) for the duration of the COVID-19 declaration under Section 56 4(b)(1) of the Act, 21 U.S.C. section 360bbb-3(b)(1), unless the authorization is terminated or revoked sooner. Performed at [facility], [address] [place], [place] [address]      Radiology Reports Dg Chest 2 View  Result Date: 11/06/2019 CLINICAL DATA:  Pneumothorax EXAM: CHEST - 2 VIEW COMPARISON:  November 05, 2019 FINDINGS: There is slight interval enlargement of the left-sided pneumothorax with slight rightward mediastinal shift. The right lung is clear. No acute osseous abnormality. IMPRESSION: Moderate left pneumothorax, slightly enlarged from the prior exam now with slight rightward mediastinal shift. These results were called by telephone at the time of interpretation on 11/06/2019 at 1:17 am to provider Gomez Cleverly PA, who verbally acknowledged these results. Electronically Signed   By: Jonna Clark M.D.   On: 11/06/2019 01:28   Dg Chest 2 View  Result Date: 11/05/2019 CLINICAL DATA:  Pneumothorax follow-up. EXAM: CHEST - 2 VIEW COMPARISON:  Chest x-ray from same day at 6:35 p.m. FINDINGS: The heart size and mediastinal contours are within normal limits. Normal pulmonary vascularity. Unchanged small left pneumothorax. Postsurgical changes in the right lung apex again noted. No consolidation or pleural effusion. No acute osseous abnormality. IMPRESSION: 1. Unchanged small left pneumothorax. Electronically Signed   By: Obie Dredge M.D.   On: 11/05/2019 20:40   Dg Chest 2 View  Result Date: 11/05/2019 CLINICAL DATA:  Left shoulder blade pain. History of right-sided lung collapse. EXAM: CHEST - 2 VIEW COMPARISON:  September 11, 2018 FINDINGS: There is a left-sided pneumothorax extending from apex towards base measuring 11 mm laterally and 17 mm at the apex. No shift to suggest tension pneumothorax. Postsurgical changes are seen in the right apex. No right-sided pneumothorax. Cardiomediastinal silhouette is stable. The lungs are clear. IMPRESSION: New left pneumothorax measuring 17 mm at the apex and 11 mm laterally with no evidence of tension. Findings called to the patient's PA, Terance Hart. Electronically Signed   By: Gerome Sam III M.D   On: 11/05/2019  18:47   Dg  Chest Port 1 View In Am  Result Date: 11/07/2019 CLINICAL DATA:  Chest tube for pneumothorax EXAM: PORTABLE CHEST 1 VIEW COMPARISON:  11/06/2019 FINDINGS: Pigtail chest tube on the left is unchanged.  No pneumothorax. Lungs are clear without infiltrate or effusion. Surgical staples in the right lung apex. IMPRESSION: Left chest tube remains in place. No pneumothorax. Lungs are clear. Electronically Signed   By: Franchot Gallo M.D.   On: 11/07/2019 10:52   Dg Chest Portable 1 View  Result Date: 11/06/2019 CLINICAL DATA:  Pneumothorax. Status post chest tube placement. EXAM: PORTABLE CHEST 1 VIEW COMPARISON:  One-view chest x-ray 11/06/2019 FINDINGS: Heart size is normal. Left-sided chest tube is in place. The left lung is re-expanded. A tiny residual pneumothorax is visible at the apex. The right lung is clear. Degenerative changes are noted at the shoulders. IMPRESSION: 1. Tiny residual pneumothorax at the apex. 2. Left-sided chest tube in place. 3. The right lung is clear. Electronically Signed   By: San Morelle M.D.   On: 11/06/2019 05:11    Bonnell Public M.D on 11/07/2019 at 2:56 PM  [email or web address] - password [facility]  [facility] -  Office  [phone]

## 2019-11-07 NOTE — Consult Note (Addendum)
[address]       [place] [address]             [id number]        Yigit Norkus [facility] Medical Record #[id number] Date of Birth: [birth year]  Referring: [facility] Primary Care: Mirna Mires, MD Primary Cardiologist:No primary care provider on file.  Chief Complaint:    Chief Complaint  Patient presents with   Shortness of Breath   History of Present Illness:      Ray [name] is a 58 yo AA male well known to [facility].  He has a known history of recurrent pneumothoraces.  He underwent Right VATS with Video Bronchoscopy, stapling of apical blebs x 2 and mechanical pleurodesis.  This was performed on 12/20/2017 by Dr. Rose Sage.  He has done well since that time.  However on 11/06/2019 he was at work lifting water when he felt a sharp pain in his left shoulder/chest area.  He felt this pain was similar to his previous pneumothorax on the right side.  He presented to the ED at [facility] for evaluation.  He was short of breath on arrival.  CXR was obtained and showed moderate sized left pneumothorax with slight rightward shift.  He was underwent pigtail catheter placement in the ED.  Follow up CXR showed re-inflation of the lung.  He was admitted by the hospital service and transferred to [name] for further care.  Currently the patient is doing well.  He denies chest pain and shortness of breath.  He wishes to get the left side fixed like he had the right done in the past.  He states he doesn't want to go through being admitted several times with multiple chest tube placements.   Current Activity/ Functional Status: Patient is independent with mobility/ambulation, transfers, ADL's, IADL's.   Zubrod Score: At the time of surgery this patients most appropriate activity status/level should be described as:     0    Normal activity, no symptoms     1    Restricted in physical strenuous activity but ambulatory, able to do out light  work     2    Ambulatory and capable of self care, unable to do work activities, up and about                 more than 50%  Of the time                                3    Only limited self care, in bed greater than 50% of waking hours     4    Completely disabled, no self care, confined to bed or chair     5    Moribund  Past Medical History:  Diagnosis Date   Atelectasis    r/side   Hypertension     Past Surgical History:  Procedure Laterality Date   COLONOSCOPY     done at age 83 per pt and was told to have it in 10 years again   STAPLING OF BLEBS Right 12/20/2017   Procedure: STAPLING OF BLEBS;  Surgeon: Delight Ovens, MD;  Location: [name] OR;  Service: Thoracic;  Laterality: Right;   VIDEO ASSISTED THORACOSCOPY Right 12/20/2017   Procedure: VIDEO ASSISTED THORACOSCOPY;  Surgeon: Delight Ovens, MD;  Location: [name] OR;  Service: Thoracic;  Laterality: Right;  VIDEO BRONCHOSCOPY N/A 12/20/2017   Procedure: VIDEO BRONCHOSCOPY;  Surgeon: Delight Ovens, MD;  Location: [facility] OR;  Service: Thoracic;  Laterality: N/A;    Social History   Tobacco Use  Smoking Status Former Smoker  Smokeless Tobacco Never Used  Tobacco Comment   quit 20 years ago    Social History   Substance and Sexual Activity  Alcohol Use Yes   Comment: pt reports 1 beer daily     Allergies  Allergen Reactions   Asa [Aspirin] Nausea Only    Current Facility-Administered Medications  Medication Dose Route Frequency Provider Last Rate Last Dose   acetaminophen (TYLENOL) tablet 650 mg  650 mg Oral Q6H PRN Pearson Grippe, MD       Or   acetaminophen (TYLENOL) suppository 650 mg  650 mg Rectal Q6H PRN Pearson Grippe, MD       atorvastatin (LIPITOR) tablet 10 mg  10 mg Oral Farrel Demark, MD   10 mg at 11/06/19 2207   bisoprolol-hydrochlorothiazide (ZIAC) 10-6.25 MG per tablet 1 tablet  1 tablet Oral Daily Pearson Grippe, MD   1 tablet at 11/07/19 0851   diphenhydrAMINE (BENADRYL)  injection 25 mg  25 mg Intravenous Q6H PRN Pearson Grippe, MD       enoxaparin (LOVENOX) injection 40 mg  40 mg Subcutaneous Daily Pearson Grippe, MD       fentaNYL (SUBLIMAZE) injection 25-50 mcg  25-50 mcg Intravenous Q2H PRN Stevie Kern A, NP   50 mcg at 11/07/19 0850   hydrALAZINE (APRESOLINE) injection 5 mg  5 mg Intravenous Q6H PRN Pearson Grippe, MD   5 mg at 11/06/19 1901   losartan (COZAAR) tablet 100 mg  100 mg Oral Daily Pearson Grippe, MD   100 mg at 11/07/19 0850   ondansetron (ZOFRAN) injection 4 mg  4 mg Intravenous Q6H PRN Pearson Grippe, MD   4 mg at 11/07/19 1000   ondansetron (ZOFRAN) injection 4 mg  4 mg Intravenous Once Mesner, Barbara Cower, MD   Stopped at 11/06/19 0730   oxyCODONE-acetaminophen (PERCOCET/ROXICET) 5-325 MG per tablet 1 tablet  1 tablet Oral Q6H PRN Elgergawy, Leana Roe, MD   1 tablet at 11/07/19 1000   pantoprazole (PROTONIX) EC tablet 40 mg  40 mg Oral Daily Pearson Grippe, MD   40 mg at 11/07/19 0850   promethazine (PHENERGAN) tablet 12.5 mg  12.5 mg Oral Q6H PRN Elgergawy, Leana Roe, MD   12.5 mg at 11/06/19 1521   traZODone (DESYREL) tablet 50 mg  50 mg Oral QHS PRN Pearson Grippe, MD        Medications Prior to Admission  Medication Sig Dispense Refill Last Dose   atorvastatin (LIPITOR) 10 MG tablet Take 10 mg by mouth at bedtime.   11/04/2019 at Unknown time   bisoprolol-hydrochlorothiazide (ZIAC) 10-6.25 MG per tablet Take 1 tablet by mouth daily. 30 tablet 0 11/05/2019 at Unknown time   losartan (COZAAR) 100 MG tablet Take 100 mg by mouth every morning.  1 11/05/2019 at Unknown time   pantoprazole (PROTONIX) 40 MG tablet TAKE 1 TABLET BY MOUTH EVERY DAY (Patient taking differently: Take 40 mg by mouth daily. ) 30 tablet 1 11/05/2019 at Unknown time   traZODone (DESYREL) 50 MG tablet Take 50 mg by mouth at bedtime as needed for sleep.   4 11/04/2019 at Unknown time    Family History  Problem Relation Age of Onset   Hypertension Mother    Colon cancer Neg Hx  Esophageal cancer Neg Hx    Review of Systems:      Cardiac Review of Systems: Y or  [    ]= no  Chest Pain [ Y, on presentation   ]  Resting SOB [ Y  ] Exertional SOB  [  ]  Orthopnea [  ]   Pedal Edema [   ]    Palpitations [  ] Syncope  [  ]   Presyncope [   ]  General Review of Systems: [Y] = yes [  ]=no Constitional: recent weight change [  ]; anorexia [  ]; fatigue [  ]; nausea [  ]; night sweats [  ]; fever [  ]; or chills [  ]                                                               Dental: Last Dentist visit:   Eye : blurred vision [  ]; diplopia [   ]; vision changes [  ];  Amaurosis fugax[  ]; Resp: cough Klaus.Mock[N  ];  wheezing[N  ];  hemoptysis[N  ]; shortness of breath[Y  ]; paroxysmal nocturnal dyspnea[  ]; dyspnea on exertion[  ]; or orthopnea[  ];  GI:  gallstones[  ], vomiting[N  ];  dysphagia[  ]; melena[  ];  hematochezia [  ]; heartburn[  ];   Hx of  Colonoscopy[  ]; GU: kidney stones [  ]; hematuria[  ];   dysuria [  ];  nocturia[  ];  history of     obstruction [  ]; urinary frequency [  ]             Skin: rash, swelling[ N ];, hair loss[  ];  peripheral edema[  ];  or itching[  ]; Musculosketetal: myalgias[  ];  joint swelling[  ];  joint erythema[  ];  joint pain[  ];  back pain[  ];  Heme/Lymph: bruising[  ];  bleeding[  ];  anemia[  ];  Neuro: TIA[ n ];  headaches[  ];  stroke[  ];  vertigo[  ];  seizures[  ];   paresthesias[  ];  difficulty walking[N  ];  Psych:depression[  ]; anxiety[  ];  Endocrine: diabetes[ N ];  thyroid dysfunction[  ];  Physical Exam: BP (!) 164/105 (BP Location: Right Arm)    Pulse 76    Temp 99.1 F (37.3 C) (Oral)    Resp (!) 22    Wt 68.1 kg    SpO2 100%    BMI 20.36 kg/m    General appearance: alert, cooperative and no distress Head: Normocephalic, without obvious abnormality, atraumatic Resp: clear to auscultation bilaterally Cardio: regular rate and rhythm GI: soft, non-tender; bowel sounds normal; no masses,  no  organomegaly Extremities: extremities normal, atraumatic, no cyanosis or edema Neurologic: Grossly normal  Diagnostic Studies & Laboratory data:     Recent Radiology Findings:  Dg Chest 2 View  Result Date: 11/06/2019 CLINICAL DATA:  Pneumothorax EXAM: CHEST - 2 VIEW COMPARISON:  November 05, 2019 FINDINGS: There is slight interval enlargement of the left-sided pneumothorax with slight rightward mediastinal shift. The right lung is clear. No acute osseous abnormality. IMPRESSION: Moderate left pneumothorax, slightly enlarged from the prior exam now with  slight rightward mediastinal shift. These results were called by telephone at the time of interpretation on 11/06/2019 at 1:17 am to provider Peter Minium PA, who verbally acknowledged these results. Electronically Signed   By: Prudencio Pair M.D.   On: 11/06/2019 01:28      Ct Chest Wo Contrast  Result Date: 11/07/2019 CLINICAL DATA:  Pneumothorax, recurrent pneumothorax on the left. EXAM: CT CHEST WITHOUT CONTRAST TECHNIQUE: Multidetector CT imaging of the chest was performed following the standard protocol without IV contrast. COMPARISON:  Multiple prior studies, most recent exam chest x-ray 11/07/2019 FINDINGS: Cardiovascular: Heart size is stable and normal no pericardial effusion. Vascular assessment limited by lack of intravenous contrast. Normal caliber thoracic aorta and central pulmonary vasculature. No signs of calcified atherosclerotic change. Mediastinum/Nodes: No enlarged mediastinal or axillary lymph nodes. Thyroid gland, trachea, and esophagus demonstrate no significant findings. Lungs/Pleura: Signs of partial lung resection for removal of bleb in the right lung apex. Small blebs are seen in the area of the right lung apex. No signs of right-sided pneumothorax. Left-sided chest tube in situ. Tiny left apical pneumothorax adjacent bullous changes, the largest in the left upper lobe measuring 3.2 x 2.8 cm. Another measuring approximately  3 x 2.3 cm seen in the medial left lung apex. Paraseptal emphysematous changes are seen elsewhere. There is also a small anterior component of the pneumothorax tracking along the anterior left chest measuring approximately 5 mm in greatest thickness. Mild basilar atelectasis bilaterally. Upper Abdomen: No acute process in the upper abdomen. Musculoskeletal: No acute bone finding or destructive bone lesion. IMPRESSION: 1. Left-sided chest tube in situ with small residual pneumothorax seen at the lung apex and anteriorly. 2. Bullous changes at the left lung apex as described. 3. Postoperative changes in the right lung apex since the previous CT with signs of paraseptal emphysema elsewhere. 4. No other acute abnormalities. Emphysema (ICD10-J43.9). Electronically Signed   By: Zetta Bills M.D.   On: 11/07/2019 14:57     I have independently reviewed the above radiologic studies and discussed with the patient   Recent Lab Findings: Lab Results  Component Value Date   WBC 8.2 11/06/2019   HGB 12.4 (L) 11/06/2019   HCT 37.9 (L) 11/06/2019   PLT 305 11/06/2019   GLUCOSE 142 (H) 11/07/2019   ALT 61 (H) 09/11/2018   AST 87 (H) 09/11/2018   NA 138 11/07/2019   K 3.0 (L) 11/07/2019   CL 103 11/07/2019   CREATININE 1.00 11/07/2019   BUN 5 (L) 11/07/2019   CO2 27 11/07/2019   INR 0.9 11/06/2019    Assessment / Plan:  1. Left sided pneumothorax- pigtail catheter in place, no air leak present, CXR shows lung is expanded... 2. H/O HTN- per medicine 3. Dispo- patient stable, no air leak from chest tube, will place chest tube to water seal... However with patient's history of recurrent pneumothoraces on right side, will get CT of chest to assess for Blebs on left side...   I  spent 55 minutes counseling the patient face to face.   Ellwood Handler, PA-C  11/07/2019 11:40 AM  Patient seen and examined and history reviewed, CT scan of the chest was obtained and also reviewed revealing 2 large apical  blebs on the left most likely the cause of his  Pneumothorax on the left.  Previously the right side was treated initially with a tube thoracostomy, but he ultimately recurred and required a right VATS.  The patient is very concerned about a repeat  of what happened to the right in for first proceed with stapling of apical blebs at this time, with a 2 significant sized blebs at the apex it is likely that he will have recurrence if treated solely with chest.  Risks options and possible complications were discussed with the patient in detail and he is willing to proceed.  We will plan to proceed with bronchoscopy left video-assisted thoracoscopy and stapling of apical blebs tomorrow.  Patient is agreeable  I have seen and examined Ray [name] and have developed the  above assessment  and plan.  Delight Ovens MD Beeper [phone] Office [phone] 11/09/2019 2:50 PM    Delight Ovens MD Beeper [phone] Office [phone] 11/09/2019 2:49 PM

## 2019-11-07 NOTE — Plan of Care (Signed)
  Problem: Education: Goal: Knowledge of General Education information will improve Description: Including pain rating scale, medication(s)/side effects and non-pharmacologic comfort measures Outcome: Progressing   Problem: Pain Managment: Goal: General experience of comfort will improve 11/07/2019 0108 by Mayia Megill, [place] L, RN Outcome: Progressing 11/07/2019 0106 by Helem Reesor, [place] L, RN Outcome: Progressing

## 2019-11-07 NOTE — Progress Notes (Signed)
   Vital Signs MEWS/VS Documentation      11/07/2019 1700 11/07/2019 1900 11/07/2019 1938 11/07/2019 2015   MEWS Score:  1  1  2  1    MEWS Score Color:  Green  Green  Yellow  Green   Resp:  (!) 23  -  (!) 26  (!) 22   Pulse:  84  -  71  82   BP:  (!) 157/89  -  (!) 172/85  -   Temp:  99.3 F (37.4 C)  -  98.5 F (36.9 C)  -   O2 Device:  Room Air  -  Room Air  -       Patient RR went up to 29. On arrival, patient denied SOB and no apparent distress was present. RR can back down to 20. Continue to monitor.     Ray [name] 11/07/2019,8:17 PM

## 2019-11-08 ENCOUNTER — Inpatient Hospital Stay (HOSPITAL_COMMUNITY): Payer: 59

## 2019-11-08 LAB — RENAL FUNCTION PANEL
Albumin: 3.1 g/dL — ABNORMAL LOW (ref 3.5–5.0)
Anion gap: 10 (ref 5–15)
BUN: <5 mg/dL — ABNORMAL LOW (ref 6–20)
CO2: 26 mmol/L (ref 22–32)
Calcium: 8.8 mg/dL — ABNORMAL LOW (ref 8.9–10.3)
Chloride: 103 mmol/L (ref 98–111)
Creatinine, Ser: 0.96 mg/dL (ref 0.61–1.24)
GFR calc Af Amer: >60 mL/min (ref >60)
GFR calc non Af Amer: >60 mL/min (ref >60)
Glucose, Bld: 114 mg/dL — ABNORMAL HIGH (ref 70–99)
Phosphorus: 1.7 mg/dL — ABNORMAL LOW (ref 2.5–4.6)
Potassium: 3.6 mmol/L (ref 3.5–5.1)
Sodium: 139 mmol/L (ref 135–145)

## 2019-11-08 LAB — MAGNESIUM: Magnesium: 1.9 mg/dL (ref 1.7–2.4)

## 2019-11-08 MED ORDER — POLYETHYLENE GLYCOL 3350 17 G PO PACK
17.0000 g | PACK | Freq: Every day | ORAL | Status: DC
  Administered 2019-11-08 – 2019-11-12 (×2): 17 g via ORAL
  Filled 2019-11-08 (×3): qty 1

## 2019-11-08 MED ORDER — LISINOPRIL 20 MG PO TABS
40.0000 mg | ORAL_TABLET | Freq: Every day | ORAL | Status: DC
  Administered 2019-11-08 – 2019-11-09 (×2): 40 mg via ORAL
  Filled 2019-11-08 (×2): qty 1

## 2019-11-08 MED ORDER — DOCUSATE SODIUM 100 MG PO CAPS
100.0000 mg | ORAL_CAPSULE | Freq: Two times a day (BID) | ORAL | Status: DC
  Administered 2019-11-08 – 2019-11-09 (×4): 100 mg via ORAL
  Filled 2019-11-08 (×4): qty 1

## 2019-11-08 MED ORDER — POTASSIUM CHLORIDE CRYS ER 20 MEQ PO TBCR
40.0000 meq | EXTENDED_RELEASE_TABLET | Freq: Once | ORAL | Status: AC
  Administered 2019-11-08: 40 meq via ORAL
  Filled 2019-11-08: qty 2

## 2019-11-08 NOTE — Progress Notes (Signed)
NT noticed pt pigtail catheter tubing disconnected from chest tube. RN and charge RN reconnected tubing and reapplied tape. Pt denies any chest discomfort. O2 sat between 97-98% RA. Dr. Cyndia Bent notified. Orders placed for portable chest x-ray.  Will continue to monitor.

## 2019-11-08 NOTE — Progress Notes (Signed)
PROGRESS NOTE                                                                                                                                                                                                             Patient Demographics:    Ray [name], is a 59 y.o. male, DOB - 1961-07-24, WNU:[id number]  Admit date - 11/05/2019   Admitting Physician Pearson Grippe, MD  Outpatient Primary MD for the patient is Mirna Mires, MD  LOS - 2   Chief Complaint  Patient presents with   Shortness of Breath       Brief Narrative   Patient is a 58 year old African-American male with past medical history significant for hypertension, atelectasis and prior history of pneumothorax (right-sided).  Patient was admitted with left shoulder and chest pain.  Work-up done revealed moderate left-sided pneumothorax.  Cardiothoracic surgery was consulted.  Patient has left-sided chest tube to waterseal.  Patient's lung has re-expanded.  Cardiothoracic team is directing care.  We will continue to optimize blood pressure control.  11/08/2019: CT chest done yesterday revealed small residual pneumothorax at the left lung apex and anteriorly.  Bullous changes at the lung apex also noted.  CT surgery is directing care.  Patient has no active medical problems.  Will increase lisinopril to 40 Mg p.o. once daily.  Patient will be discharged back home once cleared by the cardiothoracic surgery team.   Subjective:    Ray [name] denies shortness of breath or chest pain.  No constitutional symptoms reported.   Assessment  & Plan :    Principal Problem:   Pneumothorax Active Problems:   Hypertension   Hypokalemia  Left spontaneous pneumothorax: -Kindly see above documentation.   -Patient has recurrent spontaneous pneumothorax.   -Patient has left-sided chest tube to waterseal.  Cardiothoracic team is managing.   -Lung has reexpanded.   -Results of CT chest done yesterday is  as documented above. -Patient is medically stable.  Hypokalemia -Patient has been on bisoprolol/HCTZ  -We will DC bisoprolol/HCTZ  -K-Dur 40 M EQ p.o. every 4 hours x2 doses -Magnesium was 1.5  -Will give IV magnesium 2 g x 1 dose.   -Repeat renal panel and magnesium in the morning. 11/08/2019: Potassium is 3.6 today.  Magnesium is 1.9.  We will give K-Dur 40 M EQ p.o. x1 dose.  Hypertension -Blood pressures not controlled.   -We will also discontinue Cozaar  -We will start patient on lisinopril 20 mg p.o. once daily and Norvasc 10 mg p.o. once daily.   -Monitor blood pressure closely  -If hypokalemia persists, and blood pressure is not controlled, have low threshold to work patient up for primary aldosteronism. 11/08/2019: We will increase lisinopril to 40 Mg p.o. once daily.  Continue Norvasc.  Patient is medically optimized.  Hyperlipidemia Continue Lipitor 10 mg daily  GERD Continue Protonix 40 mg daily  Insomnia Continue trazodone 50 mg at night as needed   Code Status : Full Family Communication  : D/W patient Disposition Plan  : Home when cleared for discharge by the cardiothoracic team. Barriers For Discharge : Has chest tube, remains with pneumothorax  Consults  : CT surgery  Procedures  : Pigtail catheter insertion.  Cardiothoracic team plans chest tube to waterseal.  DVT Prophylaxis  : SCD  Lab Results  Component Value Date   PLT 305 11/06/2019    Antibiotics  :    Anti-infectives (From admission, onward)   None        Objective:   Vitals:   11/08/19 0016 11/08/19 0400 11/08/19 0807 11/08/19 0905  BP: (!) 141/81 140/88 (!) 162/86 (!) 163/94  Pulse: 77 74 66   Resp: 20 20 (!) 23   Temp: 98.8 F (37.1 C) 98.6 F (37 C) 98.8 F (37.1 C)   TempSrc: Oral Oral Oral   SpO2: 99% 95% 98%   Weight:  68.1 kg      Wt Readings from Last 3 Encounters:  11/08/19 68.1 kg  11/25/18 68.5 kg  10/16/18 68.6 kg     Intake/Output Summary (Last 24  hours) at 11/08/2019 1152 Last data filed at 11/08/2019 0334 Gross per 24 hour  Intake 244.93 ml  Output 525 ml  Net -280.07 ml     Physical Exam General: Patient is not in any distress.  Neck: Supple.  No raised JVD.  Lungs: Clear to auscultation.  Adequate air entry globally.  CVS: S1-S2.  Neuro: Nonfocal.  Patient moves all extremities.  Patient is awake and alert.  Abdomen: Soft and nontender.  No organomegaly appreciated.  Extremities: No leg edema.        Data Review:    CBC Recent Labs  Lab 11/06/19 0151  WBC 8.2  HGB 12.4*  HCT 37.9*  PLT 305  MCV 99.2  MCH 32.5  MCHC 32.7  RDW 13.4  LYMPHSABS 1.2  MONOABS 0.8  EOSABS 0.0  BASOSABS 0.1    Chemistries  Recent Labs  Lab 11/06/19 0151 11/07/19 0947 11/08/19 0445  NA 140 138 139  K 2.9* 3.0* 3.6  CL 100 103 103  CO2 GLUCOSE 124* 142* 114*  BUN 7 5* <5*  CREATININE 1.09 1.00 0.96  CALCIUM 9.1 8.4* 8.8*  MG  --  1.5* 1.9   ------------------------------------------------------------------------------------------------------------------ No results for input(s): CHOL, HDL, LDLCALC, TRIG, CHOLHDL, LDLDIRECT in the last 72 hours.  No results found for: HGBA1C ------------------------------------------------------------------------------------------------------------------ No results for input(s): TSH, T4TOTAL, T3FREE, THYROIDAB in the last 72 hours.  Invalid input(s): FREET3 ------------------------------------------------------------------------------------------------------------------ No results for input(s): VITAMINB12, FOLATE, FERRITIN, TIBC, IRON, RETICCTPCT in the last 72 hours.  Coagulation profile Recent Labs  Lab 11/06/19 0151  INR 0.9    No results for input(s): DDIMER in the last 72 hours.  Cardiac Enzymes No results for input(s): CKMB, TROPONINI, MYOGLOBIN in the last 168 hours.  Invalid input(s):  CK ------------------------------------------------------------------------------------------------------------------ No results found for: BNP  Inpatient Medications  Scheduled Meds:  amLODipine  10 mg Oral Daily   atorvastatin  10 mg Oral QHS   docusate sodium  100 mg Oral BID   enoxaparin (LOVENOX) injection  40 mg Subcutaneous Daily   lisinopril  20 mg Oral Daily   ondansetron (ZOFRAN) IV  4 mg Intravenous Once   pantoprazole  40 mg Oral Daily   polyethylene glycol  17 g Oral Daily   Continuous Infusions:  PRN Meds:.acetaminophen **OR** acetaminophen, diphenhydrAMINE, fentaNYL (SUBLIMAZE) injection, hydrALAZINE, ondansetron (ZOFRAN) IV, oxyCODONE-acetaminophen, promethazine, traZODone  Micro Results Recent Results (from the past 240 hour(s))  SARS CORONAVIRUS 2 (TAT 6-24 HRS) Nasopharyngeal Nasopharyngeal Swab     Status: None   Collection Time: 11/06/19  2:14 AM   Specimen: Nasopharyngeal Swab  Result Value Ref Range Status   SARS Coronavirus 2 NEGATIVE NEGATIVE Final    Comment: (NOTE) SARS-CoV-2 target nucleic acids are NOT DETECTED. The SARS-CoV-2 RNA is generally detectable in upper and lower respiratory specimens during the acute phase of infection. Negative results do not preclude SARS-CoV-2 infection, do not rule out co-infections with other pathogens, and should not be used as the sole basis for treatment or other patient management decisions. Negative results must be combined with clinical observations, patient history, and epidemiological information. The expected result is Negative. Fact Sheet for Patients: HairSlick.nohttps://www.fda.gov/media/138098/download Fact Sheet for Healthcare Providers: quierodirigir.comhttps://www.fda.gov/media/138095/download This test is not yet approved or cleared by the [place] FDA and  has been authorized for detection and/or diagnosis of SARS-CoV-2 by FDA under an Emergency Use Authorization (EUA). This EUA will remain  in effect (meaning  this test can be used) for the duration of the COVID-19 declaration under Section 56 4(b)(1) of the Act, 21 U.S.C. section 360bbb-3(b)(1), unless the authorization is terminated or revoked sooner. Performed at [facility], [address] [place], [place] [address]     Radiology Reports Dg Chest 2 View  Result Date: 11/06/2019 CLINICAL DATA:  Pneumothorax EXAM: CHEST - 2 VIEW COMPARISON:  November 05, 2019 FINDINGS: There is slight interval enlargement of the left-sided pneumothorax with slight rightward mediastinal shift. The right lung is clear. No acute osseous abnormality. IMPRESSION: Moderate left pneumothorax, slightly enlarged from the prior exam now with slight rightward mediastinal shift. These results were called by telephone at the time of interpretation on 11/06/2019 at 1:17 am to provider Gomez CleverlySherri Utsill PA, who verbally acknowledged these results. Electronically Signed   By: Jonna ClarkBindu  Avutu M.D.   On: 11/06/2019 01:28   Dg Chest 2 View  Result Date: 11/05/2019 CLINICAL DATA:  Pneumothorax follow-up. EXAM: CHEST - 2 VIEW COMPARISON:  Chest x-ray from same day at 6:35 p.m. FINDINGS: The heart size and mediastinal contours are within normal limits. Normal pulmonary vascularity. Unchanged small left pneumothorax. Postsurgical changes in the right lung apex again noted. No consolidation or pleural effusion. No acute osseous abnormality. IMPRESSION: 1. Unchanged small left pneumothorax. Electronically Signed   By: Obie DredgeWilliam T Derry M.D.   On: 11/05/2019 20:40   Dg Chest 2 View  Result Date: 11/05/2019 CLINICAL DATA:  Left shoulder blade pain. History of right-sided lung collapse. EXAM: CHEST - 2 VIEW COMPARISON:  September 11, 2018 FINDINGS: There is a left-sided pneumothorax extending from apex towards base measuring 11 mm laterally and 17 mm at the apex. No shift to suggest tension pneumothorax. Postsurgical changes are seen in the right apex. No right-sided pneumothorax.  Cardiomediastinal silhouette  is stable. The lungs are clear. IMPRESSION: New left pneumothorax measuring 17 mm at the apex and 11 mm laterally with no evidence of tension. Findings called to the patient's PA, Janetta Hora. Electronically Signed   By: Dorise Bullion III M.D   On: 11/05/2019 18:47   Ct Chest Wo Contrast  Result Date: 11/07/2019 CLINICAL DATA:  Pneumothorax, recurrent pneumothorax on the left. EXAM: CT CHEST WITHOUT CONTRAST TECHNIQUE: Multidetector CT imaging of the chest was performed following the standard protocol without IV contrast. COMPARISON:  Multiple prior studies, most recent exam chest x-ray 11/07/2019 FINDINGS: Cardiovascular: Heart size is stable and normal no pericardial effusion. Vascular assessment limited by lack of intravenous contrast. Normal caliber thoracic aorta and central pulmonary vasculature. No signs of calcified atherosclerotic change. Mediastinum/Nodes: No enlarged mediastinal or axillary lymph nodes. Thyroid gland, trachea, and esophagus demonstrate no significant findings. Lungs/Pleura: Signs of partial lung resection for removal of bleb in the right lung apex. Small blebs are seen in the area of the right lung apex. No signs of right-sided pneumothorax. Left-sided chest tube in situ. Tiny left apical pneumothorax adjacent bullous changes, the largest in the left upper lobe measuring 3.2 x 2.8 cm. Another measuring approximately 3 x 2.3 cm seen in the medial left lung apex. Paraseptal emphysematous changes are seen elsewhere. There is also a small anterior component of the pneumothorax tracking along the anterior left chest measuring approximately 5 mm in greatest thickness. Mild basilar atelectasis bilaterally. Upper Abdomen: No acute process in the upper abdomen. Musculoskeletal: No acute bone finding or destructive bone lesion. IMPRESSION: 1. Left-sided chest tube in situ with small residual pneumothorax seen at the lung apex and anteriorly. 2. Bullous changes at  the left lung apex as described. 3. Postoperative changes in the right lung apex since the previous CT with signs of paraseptal emphysema elsewhere. 4. No other acute abnormalities. Emphysema (ICD10-J43.9). Electronically Signed   By: Zetta Bills M.D.   On: 11/07/2019 14:57   Dg Chest Port 1 View In Am  Result Date: 11/07/2019 CLINICAL DATA:  Chest tube for pneumothorax EXAM: PORTABLE CHEST 1 VIEW COMPARISON:  11/06/2019 FINDINGS: Pigtail chest tube on the left is unchanged.  No pneumothorax. Lungs are clear without infiltrate or effusion. Surgical staples in the right lung apex. IMPRESSION: Left chest tube remains in place. No pneumothorax. Lungs are clear. Electronically Signed   By: Franchot Gallo M.D.   On: 11/07/2019 10:52   Dg Chest Portable 1 View  Result Date: 11/06/2019 CLINICAL DATA:  Pneumothorax. Status post chest tube placement. EXAM: PORTABLE CHEST 1 VIEW COMPARISON:  One-view chest x-ray 11/06/2019 FINDINGS: Heart size is normal. Left-sided chest tube is in place. The left lung is re-expanded. A tiny residual pneumothorax is visible at the apex. The right lung is clear. Degenerative changes are noted at the shoulders. IMPRESSION: 1. Tiny residual pneumothorax at the apex. 2. Left-sided chest tube in place. 3. The right lung is clear. Electronically Signed   By: San Morelle M.D.   On: 11/06/2019 05:11    Bonnell Public M.D on 11/08/2019 at 11:52 AM  [email or web address] - password [facility]  [facility] -  Office  [phone]

## 2019-11-08 NOTE — Progress Notes (Addendum)
      [address]       [place] [address]             [phone]       Subjective:  No new complaints.  Continues to state he just wants to get his left side fixed so he doesn't have to keep coming back.  Events from nursing noted, they have chest tube reconnected and redressed.  Objective: Vital signs in last 24 hours: Temp:  [98.5 F (36.9 C)-99.3 F (37.4 C)] 98.8 F (37.1 C) (11/15 0807) Pulse Rate:  [66-84] 66 (11/15 0807) Cardiac Rhythm: Normal sinus rhythm (11/15 0700) Resp:  [20-26] 23 (11/15 0807) BP: (140-172)/(81-94) 163/94 (11/15 0905) SpO2:  [95 %-100 %] 98 % (11/15 0807) Weight:  [68.1 kg] 68.1 kg (11/15 0400)  Intake/Output from previous day: 11/14 0701 - 11/15 0700 In: 244.9 [P.O.:240; IV Piggyback:4.9] Out: 525 [Urine:525]  General appearance: alert, cooperative and no distress Heart: regular rate and rhythm Lungs: clear to auscultation bilaterally Abdomen: soft, non-tender; bowel sounds normal; no masses,  no organomegaly Wound: clean and dry  Lab Results: Recent Labs    11/06/19 0151  WBC 8.2  HGB 12.4*  HCT 37.9*  PLT 305   BMET:  Recent Labs    11/07/19 0947 11/08/19 0445  NA 138 139  K 3.0* 3.6  CL 103 103  CO2 27 26  GLUCOSE 142* 114*  BUN 5* <5*  CREATININE 1.00 0.96  CALCIUM 8.4* 8.8*    PT/INR:  Recent Labs    11/06/19 0151  LABPROT 12.1  INR 0.9   ABG    Component Value Date/Time   PHART 7.482 (H) 12/21/2017 0409   HCO3 33.6 (H) 12/21/2017 0409   TCO2 35 (H) 12/21/2017 0409   O2SAT 99.0 12/21/2017 0409   CBG (last 3)  No results for input(s): GLUCAP in the last 72 hours.  Assessment/Plan:  1. Pneumothorax, left- pigail in place, trace apical pneumothorax on CXR, no air leak leave chest tube on water seal 2. Pulm- CT scan reviewed, there are bulla at left apex, with trace residual pneumothorax 3. CV= HTN- care per medicine 4. Dispo- patient stable, chest tube on water seal without air  leak, CT scan shows bulla at apex with trace residual pneumothorax, Dr. Servando Snare is aware of patient and will review CT scan, patient may benefit from VATS procedure to decrease risk of further ocurrences   LOS: 2 days    Ellwood Handler, PA-C  11/08/2019  Agree with above. CXR early this am showed development of a moderate left ptx. The nurse called and said the tube became disconnected after this. I suspect the ptx was probably because the tube was becoming disconnected or had already. Follow up CXR after the tube was reconnected shows resolution of the ptx. 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 Will have Dr. Servando Snare follow up in the morning.

## 2019-11-09 ENCOUNTER — Other Ambulatory Visit: Payer: Self-pay

## 2019-11-09 LAB — COMPREHENSIVE METABOLIC PANEL
ALT: 34 U/L (ref 0–44)
AST: 38 U/L (ref 15–41)
Albumin: 3.6 g/dL (ref 3.5–5.0)
Alkaline Phosphatase: 54 U/L (ref 38–126)
Anion gap: 9 (ref 5–15)
BUN: <5 mg/dL — ABNORMAL LOW (ref 6–20)
CO2: 24 mmol/L (ref 22–32)
Calcium: 9 mg/dL (ref 8.9–10.3)
Chloride: 105 mmol/L (ref 98–111)
Creatinine, Ser: 0.98 mg/dL (ref 0.61–1.24)
GFR calc Af Amer: >60 mL/min (ref >60)
GFR calc non Af Amer: >60 mL/min (ref >60)
Glucose, Bld: 117 mg/dL — ABNORMAL HIGH (ref 70–99)
Potassium: 3.8 mmol/L (ref 3.5–5.1)
Sodium: 138 mmol/L (ref 135–145)
Total Bilirubin: 0.4 mg/dL (ref 0.3–1.2)
Total Protein: 7 g/dL (ref 6.5–8.1)

## 2019-11-09 LAB — RENAL FUNCTION PANEL
Albumin: 3 g/dL — ABNORMAL LOW (ref 3.5–5.0)
Anion gap: 9 (ref 5–15)
BUN: <5 mg/dL — ABNORMAL LOW (ref 6–20)
CO2: 25 mmol/L (ref 22–32)
Calcium: 8.8 mg/dL — ABNORMAL LOW (ref 8.9–10.3)
Chloride: 104 mmol/L (ref 98–111)
Creatinine, Ser: 0.99 mg/dL (ref 0.61–1.24)
GFR calc Af Amer: >60 mL/min (ref >60)
GFR calc non Af Amer: >60 mL/min (ref >60)
Glucose, Bld: 103 mg/dL — ABNORMAL HIGH (ref 70–99)
Phosphorus: 2.1 mg/dL — ABNORMAL LOW (ref 2.5–4.6)
Potassium: 3.6 mmol/L (ref 3.5–5.1)
Sodium: 138 mmol/L (ref 135–145)

## 2019-11-09 LAB — BLOOD GAS, ARTERIAL
Acid-Base Excess: 0 mmol/L (ref 0.0–2.0)
Bicarbonate: 23.7 mmol/L (ref 20.0–28.0)
Drawn by: 535471
FIO2: 21
O2 Saturation: 97 %
Patient temperature: 36.8
pCO2 arterial: 35.2 mmHg (ref 32.0–48.0)
pH, Arterial: 7.442 (ref 7.350–7.450)
pO2, Arterial: 90.5 mmHg (ref 83.0–108.0)

## 2019-11-09 LAB — CBC
HCT: 37.5 % — ABNORMAL LOW (ref 39.0–52.0)
Hemoglobin: 12.7 g/dL — ABNORMAL LOW (ref 13.0–17.0)
MCH: 33.2 pg (ref 26.0–34.0)
MCHC: 33.9 g/dL (ref 30.0–36.0)
MCV: 97.9 fL (ref 80.0–100.0)
Platelets: 245 10*3/uL (ref 150–400)
RBC: 3.83 MIL/uL — ABNORMAL LOW (ref 4.22–5.81)
RDW: 12.8 % (ref 11.5–15.5)
WBC: 5.5 10*3/uL (ref 4.0–10.5)
nRBC: 0 % (ref 0.0–0.2)

## 2019-11-09 LAB — PROTIME-INR
INR: 1 (ref 0.8–1.2)
Prothrombin Time: 13.2 seconds (ref 11.4–15.2)

## 2019-11-09 LAB — TYPE AND SCREEN
ABO/RH(D): O POS
Antibody Screen: NEGATIVE

## 2019-11-09 LAB — MAGNESIUM: Magnesium: 1.7 mg/dL (ref 1.7–2.4)

## 2019-11-09 LAB — APTT: aPTT: 25 seconds (ref 24–36)

## 2019-11-09 MED ORDER — MAGNESIUM SULFATE IN D5W 1-5 GM/100ML-% IV SOLN
1.0000 g | Freq: Once | INTRAVENOUS | Status: AC
  Administered 2019-11-09: 1 g via INTRAVENOUS
  Filled 2019-11-09: qty 100

## 2019-11-09 MED ORDER — POTASSIUM CHLORIDE CRYS ER 20 MEQ PO TBCR
40.0000 meq | EXTENDED_RELEASE_TABLET | Freq: Once | ORAL | Status: AC
  Administered 2019-11-09: 40 meq via ORAL
  Filled 2019-11-09: qty 2

## 2019-11-09 MED ORDER — CEFAZOLIN SODIUM-DEXTROSE 2-4 GM/100ML-% IV SOLN
2.0000 g | INTRAVENOUS | Status: AC
  Administered 2019-11-10: 2 g via INTRAVENOUS
  Filled 2019-11-09 (×2): qty 100

## 2019-11-09 NOTE — Progress Notes (Addendum)
[address]       [place] [address]             [phone]        Procedure(s) (LRB): VIDEO BRONCHOSCOPY (N/A) VIDEO ASSISTED THORACOSCOPY (Left) STAPLING OF BLEBS (Left) Subjective: Feels fine, want VATS for definitive treatment since he had issues on other side in past  Objective: Vital signs in last 24 hours: Temp:  [98.4 F (36.9 C)-99.8 F (37.7 C)] 99.8 F (37.7 C) (11/16 0757) Pulse Rate:  [65-96] 72 (11/16 0757) Cardiac Rhythm: Normal sinus rhythm (11/16 0700) Resp:  [16-20] 19 (11/16 0757) BP: (123-153)/(71-90) 142/78 (11/16 0757) SpO2:  [98 %-100 %] 99 % (11/16 0757) Weight:  [66.5 kg] 66.5 kg (11/16 0339)  Hemodynamic parameters for last 24 hours:    Intake/Output from previous day: 11/15 0701 - 11/16 0700 In: 480 [P.O.:480] Out: 610 [Urine:600; Chest Tube:10] Intake/Output this shift: Total I/O In: -  Out: 100 [Urine:100]  General appearance: alert, cooperative and no distress Heart: regular rate and rhythm Lungs: clear to auscultation bilaterally  Lab Results: No results for input(s): WBC, HGB, HCT, PLT in the last 72 hours. BMET:  Recent Labs    11/08/19 0445 11/09/19 0346  NA 139 138  K 3.6 3.6  CL 103 104  CO2 26 25  GLUCOSE 114* 103*  BUN <5* <5*  CREATININE 0.96 0.99  CALCIUM 8.8* 8.8*    PT/INR: No results for input(s): LABPROT, INR in the last 72 hours. ABG    Component Value Date/Time   PHART 7.482 (H) 12/21/2017 0409   HCO3 33.6 (H) 12/21/2017 0409   TCO2 35 (H) 12/21/2017 0409   O2SAT 99.0 12/21/2017 0409   CBG (last 3)  No results for input(s): GLUCAP in the last 72 hours.  Meds Scheduled Meds: . amLODipine  10 mg Oral Daily  . atorvastatin  10 mg Oral QHS  . docusate sodium  100 mg Oral BID  . enoxaparin (LOVENOX) injection  40 mg Subcutaneous Daily  . lisinopril  40 mg Oral Daily  . ondansetron (ZOFRAN) IV  4 mg Intravenous Once  . pantoprazole  40 mg Oral Daily  . polyethylene glycol   17 g Oral Daily   Continuous Infusions: PRN Meds:.acetaminophen **OR** acetaminophen, diphenhydrAMINE, fentaNYL (SUBLIMAZE) injection, hydrALAZINE, ondansetron (ZOFRAN) IV, oxyCODONE-acetaminophen, promethazine, traZODone  Xrays Ct Chest Wo Contrast  Result Date: 11/07/2019 CLINICAL DATA:  Pneumothorax, recurrent pneumothorax on the left. EXAM: CT CHEST WITHOUT CONTRAST TECHNIQUE: Multidetector CT imaging of the chest was performed following the standard protocol without IV contrast. COMPARISON:  Multiple prior studies, most recent exam chest x-ray 11/07/2019 FINDINGS: Cardiovascular: Heart size is stable and normal no pericardial effusion. Vascular assessment limited by lack of intravenous contrast. Normal caliber thoracic aorta and central pulmonary vasculature. No signs of calcified atherosclerotic change. Mediastinum/Nodes: No enlarged mediastinal or axillary lymph nodes. Thyroid gland, trachea, and esophagus demonstrate no significant findings. Lungs/Pleura: Signs of partial lung resection for removal of bleb in the right lung apex. Small blebs are seen in the area of the right lung apex. No signs of right-sided pneumothorax. Left-sided chest tube in situ. Tiny left apical pneumothorax adjacent bullous changes, the largest in the left upper lobe measuring 3.2 x 2.8 cm. Another measuring approximately 3 x 2.3 cm seen in the medial left lung apex. Paraseptal emphysematous changes are seen elsewhere. There is also a small anterior component of the pneumothorax tracking along the anterior left chest measuring approximately  5 mm in greatest thickness. Mild basilar atelectasis bilaterally. Upper Abdomen: No acute process in the upper abdomen. Musculoskeletal: No acute bone finding or destructive bone lesion. IMPRESSION: 1. Left-sided chest tube in situ with small residual pneumothorax seen at the lung apex and anteriorly. 2. Bullous changes at the left lung apex as described. 3. Postoperative changes in the  right lung apex since the previous CT with signs of paraseptal emphysema elsewhere. 4. No other acute abnormalities. Emphysema (ICD10-J43.9). Electronically Signed   By: Donzetta Kohut M.D.   On: 11/07/2019 14:57   Dg Chest Port 1 View  Result Date: 11/08/2019 CLINICAL DATA:  Pneumothorax follow-up EXAM: PORTABLE CHEST 1 VIEW COMPARISON:  11/08/2019 FINDINGS: Left-sided chest tube remains in place. The moderate pneumothorax exhibited on the previous study has resolved. There is no current visible pneumothorax. Lungs are clear with postoperative changes in the right lung apex as before. No acute bone finding. IMPRESSION: The left-sided pneumothorax has resolved. The left chest tube remains in place. No other interval changes. Electronically Signed   By: Donzetta Kohut M.D.   On: 11/08/2019 16:16   Dg Chest Port 1 View  Result Date: 11/08/2019 CLINICAL DATA:  Pneumothorax. EXAM: PORTABLE CHEST 1 VIEW COMPARISON:  Chest x-ray of 11/07/2019 FINDINGS: Moderate left pneumothorax left-sided chest tube remains in place. Mild mediastinal shift. Cardiomediastinal contours are stable. Signs of postoperative change in the right lung apex as before. No acute bone finding. IMPRESSION: Enlargement of left-sided pneumothorax since chest CT acquired on 11/07/2019. Subsequent radiograph shows resolution of pneumothorax. Left-sided chest tube remains in place. Electronically Signed   By: Donzetta Kohut M.D.   On: 11/08/2019 16:15    Assessment/Plan: S/P Procedure(s) (LRB): VIDEO BRONCHOSCOPY (N/A) VIDEO ASSISTED THORACOSCOPY (Left) STAPLING OF BLEBS (Left)  1 hemodyn stable with some HTN readings, Tmax 99.8 2 no new CXR- no air leak on exam on H2O seal 3 plan for VATS    LOS: 3 days    Rowe Clack [facility] 11/09/2019 Pager [phone]- not for patient use   See consult note, plan to proceed with left video-assisted thoracoscopy bronchoscopy and stapling of apical blebs tomorrow. I have seen and examined  Dia Sitter and agree with the above assessment  and plan.  Delight Ovens MD Beeper [phone] Office [phone] 11/09/2019 2:51 PM

## 2019-11-09 NOTE — Progress Notes (Signed)
PROGRESS NOTE                                                                                                                                                                                                             Patient Demographics:    Ray [name], is a 58 y.o. male, DOB - [birth year], ZOX:[id number]  Admit date - 11/05/2019   Admitting Physician Pearson Grippe, MD  Outpatient Primary MD for the patient is Mirna Mires, MD  LOS - 3   Chief Complaint  Patient presents with   Shortness of Breath       Brief Narrative   Patient is a 58 year old African-American male with past medical history significant for hypertension, atelectasis and prior history of pneumothorax (right-sided).  Patient was admitted with left shoulder and chest pain.  Work-up done revealed moderate left-sided pneumothorax.  Cardiothoracic surgery was consulted.  Patient has left-sided chest tube to waterseal.  Patient's lung has re-expanded.  Cardiothoracic team is directing care.  We will continue to optimize blood pressure control.  11/09/2019: Patient seen.  Blood pressure is reasonably controlled.  No problems reported.  Cardiothoracic surgery team is planning CT surgery.  Continue to optimize blood pressure.   Subjective:    Ray [name] denies shortness of breath or chest pain.  No constitutional symptoms reported.   Assessment  & Plan :    Principal Problem:   Pneumothorax Active Problems:   Hypertension   Hypokalemia  Left spontaneous pneumothorax: -Kindly see above documentation.   -Patient has recurrent spontaneous pneumothorax.   -Patient has left-sided chest tube to waterseal.  Cardiothoracic team is managing.   -Lung has reexpanded.   -Results of CT chest noted -Patient is medically stable. 11/09/2019: CT surgery is planning for the cardiothoracic surgery.  Hypokalemia -Patient has been on bisoprolol/HCTZ  -We will DC bisoprolol/HCTZ  -K-Dur 40 M  EQ p.o. every 4 hours x2 doses -Magnesium was 1.5  -Will give IV magnesium 2 g x 1 dose.   -Repeat renal panel and magnesium in the morning. 11/08/2019: Potassium is 3.6 today.  Magnesium is 1.9.  We will give K-Dur 40 M EQ p.o. x1 dose. 11/09/2019: Potassium is 3.6 today and magnesium is 1.7.  Continue to replete.  Hypertension -Blood pressures not controlled.   -We will also discontinue Cozaar  -We will start patient on  lisinopril 20 mg p.o. once daily and Norvasc 10 mg p.o. once daily.   -Monitor blood pressure closely  -If hypokalemia persists, and blood pressure is not controlled, have low threshold to work patient up for primary aldosteronism. 11/08/2019: We will increase lisinopril to 40 Mg p.o. once daily.  Continue Norvasc.  Patient is medically optimized.  11/09/2019: Continue current antihypertensive regimen  Hyperlipidemia Continue Lipitor 10 mg daily  GERD Continue Protonix 40 mg daily  Insomnia Continue trazodone 50 mg at night as needed   Code Status : Full Family Communication  : D/W patient Disposition Plan  : Home when cleared for discharge by the cardiothoracic team. Barriers For Discharge : Has chest tube, remains with pneumothorax  Consults  : CT surgery  Procedures  : Pigtail catheter insertion.  Cardiothoracic team plans chest tube to waterseal.  DVT Prophylaxis  : SCD  Lab Results  Component Value Date   PLT 305 11/06/2019    Antibiotics  :    Anti-infectives (From admission, onward)   None        Objective:   Vitals:   11/08/19 1909 11/08/19 2209 11/09/19 0339 11/09/19 0757  BP: (!) 153/89 123/71 (!) 144/87 (!) 142/78  Pulse: 77 67 65 72  Resp: 16 17 17 19   Temp: 98.9 F (37.2 C) 98.6 F (37 C) 98.4 F (36.9 C) 99.8 F (37.7 C)  TempSrc: Oral Oral Oral Oral  SpO2: 99% 98% 98% 99%  Weight:   66.5 kg     Wt Readings from Last 3 Encounters:  11/09/19 66.5 kg  11/25/18 68.5 kg  10/16/18 68.6 kg     Intake/Output Summary  (Last 24 hours) at 11/09/2019 11/11/2019 Last data filed at 11/09/2019 0300 Gross per 24 hour  Intake 480 ml  Output 600 ml  Net -120 ml     Physical Exam General: Patient is not in any distress.  Neck: Supple.  No raised JVD.  Lungs: Clear to auscultation.  Adequate air entry globally.  CVS: S1-S2.  Neuro: Nonfocal.  Patient moves all extremities.  Patient is awake and alert.  Abdomen: Soft and nontender.  No organomegaly appreciated.  Extremities: No leg edema.        Data Review:    CBC Recent Labs  Lab 11/06/19 0151  WBC 8.2  HGB 12.4*  HCT 37.9*  PLT 305  MCV 99.2  MCH 32.5  MCHC 32.7  RDW 13.4  LYMPHSABS 1.2  MONOABS 0.8  EOSABS 0.0  BASOSABS 0.1    Chemistries  Recent Labs  Lab 11/06/19 0151 11/07/19 0947 11/08/19 0445 11/09/19 0346  NA 140 138 139 138  K 2.9* 3.0* 3.6 3.6  CL 100 103 103 104  CO2 23 27 26 25   GLUCOSE 124* 142* 114* 103*  BUN 7 5* <5* <5*  CREATININE 1.09 1.00 0.96 0.99  CALCIUM 9.1 8.4* 8.8* 8.8*  MG  --  1.5* 1.9 1.7   ------------------------------------------------------------------------------------------------------------------ No results for input(s): CHOL, HDL, LDLCALC, TRIG, CHOLHDL, LDLDIRECT in the last 72 hours.  No results found for: HGBA1C ------------------------------------------------------------------------------------------------------------------ No results for input(s): TSH, T4TOTAL, T3FREE, THYROIDAB in the last 72 hours.  Invalid input(s): FREET3 ------------------------------------------------------------------------------------------------------------------ No results for input(s): VITAMINB12, FOLATE, FERRITIN, TIBC, IRON, RETICCTPCT in the last 72 hours.  Coagulation profile Recent Labs  Lab 11/06/19 0151  INR 0.9    No results for input(s): DDIMER in the last 72 hours.  Cardiac Enzymes No results for input(s): CKMB, TROPONINI, MYOGLOBIN in the last 168 hours.  Invalid input(s):  CK ------------------------------------------------------------------------------------------------------------------ No results found for: BNP  Inpatient Medications  Scheduled Meds:  amLODipine  10 mg Oral Daily   atorvastatin  10 mg Oral QHS   docusate sodium  100 mg Oral BID   enoxaparin (LOVENOX) injection  40 mg Subcutaneous Daily   lisinopril  40 mg Oral Daily   ondansetron (ZOFRAN) IV  4 mg Intravenous Once   pantoprazole  40 mg Oral Daily   polyethylene glycol  17 g Oral Daily   Continuous Infusions:  PRN Meds:.acetaminophen **OR** acetaminophen, diphenhydrAMINE, fentaNYL (SUBLIMAZE) injection, hydrALAZINE, ondansetron (ZOFRAN) IV, oxyCODONE-acetaminophen, promethazine, traZODone  Micro Results Recent Results (from the past 240 hour(s))  SARS CORONAVIRUS 2 (TAT 6-24 HRS) Nasopharyngeal Nasopharyngeal Swab     Status: None   Collection Time: 11/06/19  2:14 AM   Specimen: Nasopharyngeal Swab  Result Value Ref Range Status   SARS Coronavirus 2 NEGATIVE NEGATIVE Final    Comment: (NOTE) SARS-CoV-2 target nucleic acids are NOT DETECTED. The SARS-CoV-2 RNA is generally detectable in upper and lower respiratory specimens during the acute phase of infection. Negative results do not preclude SARS-CoV-2 infection, do not rule out co-infections with other pathogens, and should not be used as the sole basis for treatment or other patient management decisions. Negative results must be combined with clinical observations, patient history, and epidemiological information. The expected result is Negative. Fact Sheet for Patients: HairSlick.nohttps://www.fda.gov/media/138098/download Fact Sheet for Healthcare Providers: quierodirigir.comhttps://www.fda.gov/media/138095/download This test is not yet approved or cleared by the [place] FDA and  has been authorized for detection and/or diagnosis of SARS-CoV-2 by FDA under an Emergency Use Authorization (EUA). This EUA will remain  in effect (meaning  this test can be used) for the duration of the COVID-19 declaration under Section 56 4(b)(1) of the Act, 21 U.S.C. section 360bbb-3(b)(1), unless the authorization is terminated or revoked sooner. Performed at [facility], [address] [place], [place] [address]     Radiology Reports Dg Chest 2 View  Result Date: 11/06/2019 CLINICAL DATA:  Pneumothorax EXAM: CHEST - 2 VIEW COMPARISON:  November 05, 2019 FINDINGS: There is slight interval enlargement of the left-sided pneumothorax with slight rightward mediastinal shift. The right lung is clear. No acute osseous abnormality. IMPRESSION: Moderate left pneumothorax, slightly enlarged from the prior exam now with slight rightward mediastinal shift. These results were called by telephone at the time of interpretation on 11/06/2019 at 1:17 am to provider Gomez CleverlySherri Utsill PA, who verbally acknowledged these results. Electronically Signed   By: Jonna ClarkBindu  Avutu M.D.   On: 11/06/2019 01:28   Dg Chest 2 View  Result Date: 11/05/2019 CLINICAL DATA:  Pneumothorax follow-up. EXAM: CHEST - 2 VIEW COMPARISON:  Chest x-ray from same day at 6:35 p.m. FINDINGS: The heart size and mediastinal contours are within normal limits. Normal pulmonary vascularity. Unchanged small left pneumothorax. Postsurgical changes in the right lung apex again noted. No consolidation or pleural effusion. No acute osseous abnormality. IMPRESSION: 1. Unchanged small left pneumothorax. Electronically Signed   By: Obie DredgeWilliam T Derry M.D.   On: 11/05/2019 20:40   Dg Chest 2 View  Result Date: 11/05/2019 CLINICAL DATA:  Left shoulder blade pain. History of right-sided lung collapse. EXAM: CHEST - 2 VIEW COMPARISON:  September 11, 2018 FINDINGS: There is a left-sided pneumothorax extending from apex towards base measuring 11 mm laterally and 17 mm at the apex. No shift to suggest tension pneumothorax. Postsurgical changes are seen in the right apex. No right-sided pneumothorax.  Cardiomediastinal silhouette  is stable. The lungs are clear. IMPRESSION: New left pneumothorax measuring 17 mm at the apex and 11 mm laterally with no evidence of tension. Findings called to the patient's PA, Terance Hart. Electronically Signed   By: Gerome Sam III M.D   On: 11/05/2019 18:47   Ct Chest Wo Contrast  Result Date: 11/07/2019 CLINICAL DATA:  Pneumothorax, recurrent pneumothorax on the left. EXAM: CT CHEST WITHOUT CONTRAST TECHNIQUE: Multidetector CT imaging of the chest was performed following the standard protocol without IV contrast. COMPARISON:  Multiple prior studies, most recent exam chest x-ray 11/07/2019 FINDINGS: Cardiovascular: Heart size is stable and normal no pericardial effusion. Vascular assessment limited by lack of intravenous contrast. Normal caliber thoracic aorta and central pulmonary vasculature. No signs of calcified atherosclerotic change. Mediastinum/Nodes: No enlarged mediastinal or axillary lymph nodes. Thyroid gland, trachea, and esophagus demonstrate no significant findings. Lungs/Pleura: Signs of partial lung resection for removal of bleb in the right lung apex. Small blebs are seen in the area of the right lung apex. No signs of right-sided pneumothorax. Left-sided chest tube in situ. Tiny left apical pneumothorax adjacent bullous changes, the largest in the left upper lobe measuring 3.2 x 2.8 cm. Another measuring approximately 3 x 2.3 cm seen in the medial left lung apex. Paraseptal emphysematous changes are seen elsewhere. There is also a small anterior component of the pneumothorax tracking along the anterior left chest measuring approximately 5 mm in greatest thickness. Mild basilar atelectasis bilaterally. Upper Abdomen: No acute process in the upper abdomen. Musculoskeletal: No acute bone finding or destructive bone lesion. IMPRESSION: 1. Left-sided chest tube in situ with small residual pneumothorax seen at the lung apex and anteriorly. 2. Bullous changes at  the left lung apex as described. 3. Postoperative changes in the right lung apex since the previous CT with signs of paraseptal emphysema elsewhere. 4. No other acute abnormalities. Emphysema (ICD10-J43.9). Electronically Signed   By: Donzetta Kohut M.D.   On: 11/07/2019 14:57   Dg Chest Port 1 View  Result Date: 11/08/2019 CLINICAL DATA:  Pneumothorax follow-up EXAM: PORTABLE CHEST 1 VIEW COMPARISON:  11/08/2019 FINDINGS: Left-sided chest tube remains in place. The moderate pneumothorax exhibited on the previous study has resolved. There is no current visible pneumothorax. Lungs are clear with postoperative changes in the right lung apex as before. No acute bone finding. IMPRESSION: The left-sided pneumothorax has resolved. The left chest tube remains in place. No other interval changes. Electronically Signed   By: Donzetta Kohut M.D.   On: 11/08/2019 16:16   Dg Chest Port 1 View  Result Date: 11/08/2019 CLINICAL DATA:  Pneumothorax. EXAM: PORTABLE CHEST 1 VIEW COMPARISON:  Chest x-ray of 11/07/2019 FINDINGS: Moderate left pneumothorax left-sided chest tube remains in place. Mild mediastinal shift. Cardiomediastinal contours are stable. Signs of postoperative change in the right lung apex as before. No acute bone finding. IMPRESSION: Enlargement of left-sided pneumothorax since chest CT acquired on 11/07/2019. Subsequent radiograph shows resolution of pneumothorax. Left-sided chest tube remains in place. Electronically Signed   By: Donzetta Kohut M.D.   On: 11/08/2019 16:15   Dg Chest Port 1 View In Am  Result Date: 11/07/2019 CLINICAL DATA:  Chest tube for pneumothorax EXAM: PORTABLE CHEST 1 VIEW COMPARISON:  11/06/2019 FINDINGS: Pigtail chest tube on the left is unchanged.  No pneumothorax. Lungs are clear without infiltrate or effusion. Surgical staples in the right lung apex. IMPRESSION: Left chest tube remains in place. No pneumothorax. Lungs are clear. Electronically Signed   By: Leonette Most  Carlis Abbott  M.D.   On: 11/07/2019 10:52   Dg Chest Portable 1 View  Result Date: 11/06/2019 CLINICAL DATA:  Pneumothorax. Status post chest tube placement. EXAM: PORTABLE CHEST 1 VIEW COMPARISON:  One-view chest x-ray 11/06/2019 FINDINGS: Heart size is normal. Left-sided chest tube is in place. The left lung is re-expanded. A tiny residual pneumothorax is visible at the apex. The right lung is clear. Degenerative changes are noted at the shoulders. IMPRESSION: 1. Tiny residual pneumothorax at the apex. 2. Left-sided chest tube in place. 3. The right lung is clear. Electronically Signed   By: San Morelle M.D.   On: 11/06/2019 05:11    Bonnell Public M.D on 11/09/2019 at 8:11 AM  [email or web address] - [email or web address]  [facility] -  Office  [phone]

## 2019-11-10 ENCOUNTER — Encounter (HOSPITAL_COMMUNITY)
Admission: EM | Disposition: A | Discharge status: Home / Self Care | Payer: Self-pay | Source: Home / Self Care | Attending: Internal Medicine

## 2019-11-10 ENCOUNTER — Inpatient Hospital Stay (HOSPITAL_COMMUNITY): Payer: 59

## 2019-11-10 ENCOUNTER — Encounter (HOSPITAL_COMMUNITY): Payer: Self-pay | Admitting: Certified Registered Nurse Anesthetist

## 2019-11-10 DIAGNOSIS — J9311 Primary spontaneous pneumothorax: Secondary | ICD-10-CM

## 2019-11-10 HISTORY — PX: STAPLING OF BLEBS: SHX6429

## 2019-11-10 HISTORY — PX: VIDEO ASSISTED THORACOSCOPY: SHX5073

## 2019-11-10 HISTORY — PX: VIDEO BRONCHOSCOPY: SHX5072

## 2019-11-10 LAB — URINALYSIS, ROUTINE W REFLEX MICROSCOPIC
Bilirubin Urine: NEGATIVE
Glucose, UA: 50 mg/dL — AB
Hgb urine dipstick: NEGATIVE
Ketones, ur: NEGATIVE mg/dL
Leukocytes,Ua: NEGATIVE
Nitrite: NEGATIVE
Protein, ur: NEGATIVE mg/dL
Specific Gravity, Urine: 1.016 (ref 1.005–1.030)
pH: 6 (ref 5.0–8.0)

## 2019-11-10 LAB — MRSA PCR SCREENING: MRSA by PCR: NEGATIVE

## 2019-11-10 LAB — GLUCOSE, CAPILLARY: Glucose-Capillary: 127 mg/dL — ABNORMAL HIGH (ref 70–99)

## 2019-11-10 SURGERY — BRONCHOSCOPY, VIDEO-ASSISTED
Anesthesia: General | Site: Chest

## 2019-11-10 MED ORDER — DIPHENHYDRAMINE HCL 50 MG/ML IJ SOLN
12.5000 mg | Freq: Four times a day (QID) | INTRAMUSCULAR | Status: DC | PRN
  Filled 2019-11-10: qty 1

## 2019-11-10 MED ORDER — PROPOFOL 10 MG/ML IV BOLUS
INTRAVENOUS | Status: DC | PRN
  Administered 2019-11-10: 50 mg via INTRAVENOUS
  Administered 2019-11-10: 150 mg via INTRAVENOUS

## 2019-11-10 MED ORDER — FENTANYL CITRATE (PF) 250 MCG/5ML IJ SOLN
INTRAMUSCULAR | Status: AC
  Filled 2019-11-10: qty 5

## 2019-11-10 MED ORDER — LIDOCAINE 2% (20 MG/ML) 5 ML SYRINGE
INTRAMUSCULAR | Status: DC | PRN
  Administered 2019-11-10: 60 mg via INTRAVENOUS

## 2019-11-10 MED ORDER — BUPIVACAINE HCL (PF) 0.5 % IJ SOLN
INTRAMUSCULAR | Status: DC | PRN
  Administered 2019-11-10: 30 mL

## 2019-11-10 MED ORDER — LACTATED RINGERS IV SOLN
INTRAVENOUS | Status: DC | PRN
  Administered 2019-11-10: 14:00:00 via INTRAVENOUS

## 2019-11-10 MED ORDER — BUPIVACAINE HCL (PF) 0.5 % IJ SOLN
INTRAMUSCULAR | Status: AC
  Filled 2019-11-10: qty 30

## 2019-11-10 MED ORDER — BISACODYL 5 MG PO TBEC
10.0000 mg | DELAYED_RELEASE_TABLET | Freq: Every day | ORAL | Status: DC
  Administered 2019-11-11 – 2019-11-12 (×2): 10 mg via ORAL
  Filled 2019-11-10 (×2): qty 2

## 2019-11-10 MED ORDER — PHENYLEPHRINE 40 MCG/ML (10ML) SYRINGE FOR IV PUSH (FOR BLOOD PRESSURE SUPPORT)
PREFILLED_SYRINGE | INTRAVENOUS | Status: DC | PRN
  Administered 2019-11-10 (×3): 80 ug via INTRAVENOUS

## 2019-11-10 MED ORDER — DIPHENHYDRAMINE HCL 12.5 MG/5ML PO ELIX
12.5000 mg | ORAL_SOLUTION | Freq: Four times a day (QID) | ORAL | Status: DC | PRN
  Filled 2019-11-10: qty 5

## 2019-11-10 MED ORDER — ROCURONIUM BROMIDE 10 MG/ML (PF) SYRINGE
PREFILLED_SYRINGE | INTRAVENOUS | Status: DC | PRN
  Administered 2019-11-10: 10 mg via INTRAVENOUS
  Administered 2019-11-10: 5 mg via INTRAVENOUS
  Administered 2019-11-10: 80 mg via INTRAVENOUS

## 2019-11-10 MED ORDER — ONDANSETRON HCL 4 MG/2ML IJ SOLN
4.0000 mg | Freq: Four times a day (QID) | INTRAMUSCULAR | Status: DC | PRN
  Administered 2019-11-10 – 2019-11-11 (×2): 4 mg via INTRAVENOUS

## 2019-11-10 MED ORDER — FENTANYL 40 MCG/ML IV SOLN
INTRAVENOUS | Status: DC
  Administered 2019-11-10: 150 ug via INTRAVENOUS
  Administered 2019-11-10: 75 ug via INTRAVENOUS
  Administered 2019-11-10: 17:00:00 1000 ug via INTRAVENOUS
  Administered 2019-11-11: 195 ug via INTRAVENOUS
  Administered 2019-11-11: 105 ug via INTRAVENOUS
  Administered 2019-11-11: 195 ug via INTRAVENOUS
  Administered 2019-11-11: 165 ug via INTRAVENOUS
  Administered 2019-11-11: 135 ug via INTRAVENOUS
  Administered 2019-11-11: 75 ug via INTRAVENOUS
  Administered 2019-11-12: 135 ug via INTRAVENOUS
  Administered 2019-11-12: 60 ug via INTRAVENOUS
  Filled 2019-11-10 (×2): qty 25

## 2019-11-10 MED ORDER — PHENYLEPHRINE HCL-NACL 10-0.9 MG/250ML-% IV SOLN
INTRAVENOUS | Status: DC | PRN
  Administered 2019-11-10: 25 ug/min via INTRAVENOUS

## 2019-11-10 MED ORDER — HYDROMORPHONE HCL 1 MG/ML IJ SOLN
INTRAMUSCULAR | Status: AC
  Filled 2019-11-10: qty 0.5

## 2019-11-10 MED ORDER — PROMETHAZINE HCL 25 MG/ML IJ SOLN: 6.2500 mg | INTRAMUSCULAR | Status: DC | PRN

## 2019-11-10 MED ORDER — SODIUM CHLORIDE 0.9% FLUSH: 9.0000 mL | INTRAVENOUS | Status: DC | PRN

## 2019-11-10 MED ORDER — DIPHENHYDRAMINE HCL 50 MG/ML IJ SOLN: 25.0000 mg | Freq: Once | INTRAMUSCULAR | Status: DC

## 2019-11-10 MED ORDER — SENNOSIDES-DOCUSATE SODIUM 8.6-50 MG PO TABS
1.0000 | ORAL_TABLET | Freq: Every day | ORAL | Status: DC
  Administered 2019-11-10 – 2019-11-11 (×2): 1 via ORAL
  Filled 2019-11-10 (×2): qty 1

## 2019-11-10 MED ORDER — OXYCODONE HCL 5 MG PO TABS
5.0000 mg | ORAL_TABLET | ORAL | Status: DC | PRN
  Administered 2019-11-10 – 2019-11-11 (×2): 10 mg via ORAL
  Filled 2019-11-10 (×2): qty 2

## 2019-11-10 MED ORDER — 0.9 % SODIUM CHLORIDE (POUR BTL) OPTIME
TOPICAL | Status: DC | PRN
  Administered 2019-11-10: 2000 mL

## 2019-11-10 MED ORDER — DIPHENHYDRAMINE HCL 50 MG/ML IJ SOLN: 12.5000 mg | Freq: Once | INTRAMUSCULAR | Status: DC

## 2019-11-10 MED ORDER — BUPIVACAINE LIPOSOME 1.3 % IJ SUSP
20.0000 mL | INTRAMUSCULAR | Status: AC
  Administered 2019-11-10: 20 mL
  Filled 2019-11-10: qty 20

## 2019-11-10 MED ORDER — SODIUM CHLORIDE 0.9 % IV SOLN: INTRAVENOUS | Status: DC | PRN

## 2019-11-10 MED ORDER — PROPOFOL 10 MG/ML IV BOLUS
INTRAVENOUS | Status: AC
  Filled 2019-11-10: qty 40

## 2019-11-10 MED ORDER — LABETALOL HCL 5 MG/ML IV SOLN
5.0000 mg | INTRAVENOUS | Status: AC | PRN
  Administered 2019-11-10 (×4): 5 mg via INTRAVENOUS

## 2019-11-10 MED ORDER — ONDANSETRON HCL 4 MG/2ML IJ SOLN
INTRAMUSCULAR | Status: AC
  Filled 2019-11-10: qty 2

## 2019-11-10 MED ORDER — LABETALOL HCL 5 MG/ML IV SOLN
INTRAVENOUS | Status: AC
  Filled 2019-11-10: qty 4

## 2019-11-10 MED ORDER — SODIUM CHLORIDE (PF) 0.9 % IJ SOLN
INTRAMUSCULAR | Status: DC | PRN
  Administered 2019-11-10: 50 mL

## 2019-11-10 MED ORDER — HYDRALAZINE HCL 20 MG/ML IJ SOLN
10.0000 mg | Freq: Once | INTRAMUSCULAR | Status: AC
  Administered 2019-11-10: 10 mg via INTRAVENOUS

## 2019-11-10 MED ORDER — ONDANSETRON HCL 4 MG/2ML IJ SOLN
INTRAMUSCULAR | Status: DC | PRN
  Administered 2019-11-10: 4 mg via INTRAVENOUS

## 2019-11-10 MED ORDER — OXYCODONE HCL 5 MG/5ML PO SOLN: 5.0000 mg | Freq: Once | ORAL | Status: DC | PRN

## 2019-11-10 MED ORDER — MIDAZOLAM HCL 2 MG/2ML IJ SOLN
INTRAMUSCULAR | Status: AC
  Filled 2019-11-10: qty 2

## 2019-11-10 MED ORDER — CEFAZOLIN SODIUM-DEXTROSE 2-4 GM/100ML-% IV SOLN
2.0000 g | Freq: Three times a day (TID) | INTRAVENOUS | Status: AC
  Administered 2019-11-10 – 2019-11-11 (×2): 2 g via INTRAVENOUS
  Filled 2019-11-10 (×2): qty 100

## 2019-11-10 MED ORDER — SUGAMMADEX SODIUM 200 MG/2ML IV SOLN
INTRAVENOUS | Status: DC | PRN
  Administered 2019-11-10: 150 mg via INTRAVENOUS

## 2019-11-10 MED ORDER — ENOXAPARIN SODIUM 40 MG/0.4ML ~~LOC~~ SOLN
40.0000 mg | Freq: Every day | SUBCUTANEOUS | Status: DC
  Administered 2019-11-11: 40 mg via SUBCUTANEOUS
  Filled 2019-11-10 (×2): qty 0.4

## 2019-11-10 MED ORDER — HYDROMORPHONE HCL 1 MG/ML IJ SOLN
INTRAMUSCULAR | Status: DC | PRN
  Administered 2019-11-10: 0.5 mg via INTRAVENOUS

## 2019-11-10 MED ORDER — LISINOPRIL 20 MG PO TABS: 40.0000 mg | ORAL_TABLET | Freq: Every day | ORAL | Status: DC

## 2019-11-10 MED ORDER — INSULIN ASPART 100 UNIT/ML ~~LOC~~ SOLN
0.0000 [IU] | Freq: Four times a day (QID) | SUBCUTANEOUS | Status: DC
  Administered 2019-11-10: 2 [IU] via SUBCUTANEOUS

## 2019-11-10 MED ORDER — DIPHENHYDRAMINE HCL 50 MG/ML IJ SOLN
25.0000 mg | Freq: Four times a day (QID) | INTRAMUSCULAR | Status: DC | PRN
  Administered 2019-11-10: 25 mg via INTRAVENOUS

## 2019-11-10 MED ORDER — ONDANSETRON HCL 4 MG/2ML IJ SOLN
4.0000 mg | Freq: Four times a day (QID) | INTRAMUSCULAR | Status: DC | PRN
  Filled 2019-11-10 (×2): qty 2

## 2019-11-10 MED ORDER — DEXAMETHASONE SODIUM PHOSPHATE 10 MG/ML IJ SOLN
INTRAMUSCULAR | Status: DC | PRN
  Administered 2019-11-10: 5 mg via INTRAVENOUS

## 2019-11-10 MED ORDER — DIPHENHYDRAMINE HCL 12.5 MG/5ML PO ELIX
25.0000 mg | ORAL_SOLUTION | Freq: Four times a day (QID) | ORAL | Status: DC | PRN
  Filled 2019-11-10: qty 10

## 2019-11-10 MED ORDER — LACTATED RINGERS IV SOLN
INTRAVENOUS | Status: DC
  Administered 2019-11-10 (×2): via INTRAVENOUS

## 2019-11-10 MED ORDER — ACETAMINOPHEN 160 MG/5ML PO SOLN: 1000.0000 mg | Freq: Four times a day (QID) | ORAL | Status: DC

## 2019-11-10 MED ORDER — LABETALOL HCL 5 MG/ML IV SOLN
INTRAVENOUS | Status: DC | PRN
  Administered 2019-11-10 (×4): 5 mg via INTRAVENOUS

## 2019-11-10 MED ORDER — TRAMADOL HCL 50 MG PO TABS: 50.0000 mg | ORAL_TABLET | Freq: Four times a day (QID) | ORAL | Status: DC | PRN

## 2019-11-10 MED ORDER — CHLORHEXIDINE GLUCONATE CLOTH 2 % EX PADS
6.0000 | MEDICATED_PAD | Freq: Every day | CUTANEOUS | Status: DC
  Administered 2019-11-10: 6 via TOPICAL

## 2019-11-10 MED ORDER — NALOXONE HCL 0.4 MG/ML IJ SOLN: 0.4000 mg | INTRAMUSCULAR | Status: DC | PRN

## 2019-11-10 MED ORDER — OXYCODONE HCL 5 MG PO TABS: 5.0000 mg | ORAL_TABLET | Freq: Once | ORAL | Status: DC | PRN

## 2019-11-10 MED ORDER — ACETAMINOPHEN 500 MG PO TABS
1000.0000 mg | ORAL_TABLET | Freq: Four times a day (QID) | ORAL | Status: DC
  Administered 2019-11-10 – 2019-11-12 (×7): 1000 mg via ORAL
  Filled 2019-11-10 (×7): qty 2

## 2019-11-10 MED ORDER — MIDAZOLAM HCL 5 MG/5ML IJ SOLN
INTRAMUSCULAR | Status: DC | PRN
  Administered 2019-11-10: 2 mg via INTRAVENOUS

## 2019-11-10 MED ORDER — HYDRALAZINE HCL 20 MG/ML IJ SOLN
INTRAMUSCULAR | Status: AC
  Filled 2019-11-10: qty 1

## 2019-11-10 MED ORDER — ESMOLOL HCL 100 MG/10ML IV SOLN
INTRAVENOUS | Status: AC
  Filled 2019-11-10: qty 10

## 2019-11-10 MED ORDER — DIPHENHYDRAMINE HCL 50 MG/ML IJ SOLN
INTRAMUSCULAR | Status: AC
  Filled 2019-11-10: qty 1

## 2019-11-10 MED ORDER — DIPHENHYDRAMINE HCL 50 MG/ML IJ SOLN
25.0000 mg | Freq: Once | INTRAMUSCULAR | Status: AC
  Administered 2019-11-10: 25 mg via INTRAVENOUS

## 2019-11-10 MED ORDER — FENTANYL CITRATE (PF) 100 MCG/2ML IJ SOLN: 25.0000 ug | INTRAMUSCULAR | Status: DC | PRN

## 2019-11-10 MED ORDER — ESMOLOL HCL 100 MG/10ML IV SOLN
INTRAVENOUS | Status: DC | PRN
  Administered 2019-11-10 (×4): 20 mg via INTRAVENOUS

## 2019-11-10 MED ORDER — AMLODIPINE BESYLATE 10 MG PO TABS
10.0000 mg | ORAL_TABLET | Freq: Every day | ORAL | Status: DC
  Administered 2019-11-11 – 2019-11-12 (×2): 10 mg via ORAL
  Filled 2019-11-10 (×2): qty 1

## 2019-11-10 MED ORDER — DEXAMETHASONE SODIUM PHOSPHATE 10 MG/ML IJ SOLN
INTRAMUSCULAR | Status: AC
  Filled 2019-11-10: qty 1

## 2019-11-10 MED ORDER — HYDRALAZINE HCL 20 MG/ML IJ SOLN
10.0000 mg | Freq: Once | INTRAMUSCULAR | Status: AC
  Administered 2019-11-10: 17:00:00 10 mg via INTRAVENOUS

## 2019-11-10 MED ORDER — POTASSIUM CHLORIDE IN NACL 20-0.9 MEQ/L-% IV SOLN
INTRAVENOUS | Status: DC
  Administered 2019-11-10 – 2019-11-11 (×2): via INTRAVENOUS
  Filled 2019-11-10 (×2): qty 1000

## 2019-11-10 SURGICAL SUPPLY — 110 items
ADAPTER VALVE BIOPSY EBUS (MISCELLANEOUS) IMPLANT
ADH SKN CLS APL DERMABOND .7 (GAUZE/BANDAGES/DRESSINGS) ×2
ADPTR VALVE BIOPSY EBUS (MISCELLANEOUS)
APL SRG 22X2 LUM MLBL SLNT (VASCULAR PRODUCTS)
APL SRG 7X2 LUM MLBL SLNT (VASCULAR PRODUCTS)
APPLICATOR TIP COSEAL (VASCULAR PRODUCTS) IMPLANT
APPLICATOR TIP EXT COSEAL (VASCULAR PRODUCTS) IMPLANT
BLADE CLIPPER SURG (BLADE) ×3 IMPLANT
BRUSH CYTOL CELLEBRITY 1.5X140 (MISCELLANEOUS) IMPLANT
CANISTER SUCT 3000ML PPV (MISCELLANEOUS) ×3 IMPLANT
CATH THORACIC 28FR (CATHETERS) IMPLANT
CATH THORACIC 36FR (CATHETERS) IMPLANT
CATH THORACIC 36FR RT ANG (CATHETERS) IMPLANT
CLEANER TIP ELECTROSURG 2X2 (MISCELLANEOUS) ×3 IMPLANT
CLIP VESOCCLUDE MED 6/CT (CLIP) ×3 IMPLANT
CONN ST 1/4X3/8  BEN (MISCELLANEOUS) ×1
CONN ST 1/4X3/8 BEN (MISCELLANEOUS) IMPLANT
CONT SPEC 4OZ CLIKSEAL STRL BL (MISCELLANEOUS) ×7 IMPLANT
COVER BACK TABLE 60X90IN (DRAPES) ×3 IMPLANT
COVER WAND RF STERILE (DRAPES) ×3 IMPLANT
DEFOGGER ANTIFOG KIT (MISCELLANEOUS) ×1 IMPLANT
DERMABOND ADVANCED (GAUZE/BANDAGES/DRESSINGS) ×1
DERMABOND ADVANCED .7 DNX12 (GAUZE/BANDAGES/DRESSINGS) IMPLANT
DISSECTOR BLUNT TIP ENDO 5MM (MISCELLANEOUS) IMPLANT
DRAIN CHANNEL 28F RND 3/8 FF (WOUND CARE) IMPLANT
DRAIN CHANNEL 32F RND 10.7 FF (WOUND CARE) IMPLANT
DRAPE INCISE IOBAN 66X45 STRL (DRAPES) ×1 IMPLANT
DRAPE LAPAROSCOPIC ABDOMINAL (DRAPES) ×3 IMPLANT
DRAPE SLUSH/WARMER DISC (DRAPES) ×3 IMPLANT
ELECT BLADE 4.0 EZ CLEAN MEGAD (MISCELLANEOUS) ×3
ELECT BLADE 6.5 EXT (BLADE) ×3 IMPLANT
ELECT REM PT RETURN 9FT ADLT (ELECTROSURGICAL) ×3
ELECTRODE BLDE 4.0 EZ CLN MEGD (MISCELLANEOUS) ×2 IMPLANT
ELECTRODE REM PT RTRN 9FT ADLT (ELECTROSURGICAL) ×2 IMPLANT
FORCEPS BIOP RJ4 1.8 (CUTTING FORCEPS) IMPLANT
GAUZE SPONGE 4X4 12PLY STRL (GAUZE/BANDAGES/DRESSINGS) ×3 IMPLANT
GAUZE SPONGE 4X4 16PLY XRAY LF (GAUZE/BANDAGES/DRESSINGS) ×1 IMPLANT
GLOVE BIO SURGEON STRL SZ 6.5 (GLOVE) ×6 IMPLANT
GLOVE INDICATOR 7.5 STRL GRN (GLOVE) ×1 IMPLANT
GOWN STRL REUS W/ TWL LRG LVL3 (GOWN DISPOSABLE) ×8 IMPLANT
GOWN STRL REUS W/TWL LRG LVL3 (GOWN DISPOSABLE) ×12
HANDLE STAPLE  ENDO EGIA 4 STD (STAPLE) ×1
HANDLE STAPLE ENDO EGIA 4 STD (STAPLE) IMPLANT
KIT BASIN OR (CUSTOM PROCEDURE TRAY) ×3 IMPLANT
KIT CLEAN ENDO COMPLIANCE (KITS) ×3 IMPLANT
KIT SUCTION CATH 14FR (SUCTIONS) ×3 IMPLANT
KIT TURNOVER KIT B (KITS) ×3 IMPLANT
MARKER SKIN DUAL TIP RULER LAB (MISCELLANEOUS) IMPLANT
NDL SPNL 18GX3.5 QUINCKE PK (NEEDLE) ×2 IMPLANT
NEEDLE SPNL 18GX3.5 QUINCKE PK (NEEDLE) ×3 IMPLANT
NS IRRIG 1000ML POUR BTL (IV SOLUTION) ×6 IMPLANT
OIL SILICONE PENTAX (PARTS (SERVICE/REPAIRS)) ×3 IMPLANT
PACK CHEST (CUSTOM PROCEDURE TRAY) ×3 IMPLANT
PAD ARMBOARD 7.5X6 YLW CONV (MISCELLANEOUS) ×9 IMPLANT
PASSER SUT SWANSON 36MM LOOP (INSTRUMENTS) IMPLANT
RELOAD EGIA 45 MED/THCK PURPLE (STAPLE) ×3 IMPLANT
SCISSORS LAP 5X35 DISP (ENDOMECHANICALS) ×1 IMPLANT
SEALANT PROGEL (MISCELLANEOUS) IMPLANT
SEALANT SURG COSEAL 4ML (VASCULAR PRODUCTS) IMPLANT
SEALANT SURG COSEAL 8ML (VASCULAR PRODUCTS) IMPLANT
SET IRRIG TUBING LAPAROSCOPIC (IRRIGATION / IRRIGATOR) ×1 IMPLANT
SOL ANTI FOG 6CC (MISCELLANEOUS) ×2 IMPLANT
SOLUTION ANTI FOG 6CC (MISCELLANEOUS) ×1
SPONGE TONSIL TAPE 1 RFD (DISPOSABLE) ×1 IMPLANT
STOPCOCK 3 WAY HIGH PRESSURE (MISCELLANEOUS) ×1
STOPCOCK 3WAY HIGH PRESSURE (MISCELLANEOUS) IMPLANT
STOPCOCK 4 WAY LG BORE MALE ST (IV SETS) ×3 IMPLANT
SUT PROLENE 3 0 SH DA (SUTURE) IMPLANT
SUT PROLENE 4 0 RB 1 (SUTURE)
SUT PROLENE 4-0 RB1 .5 CRCL 36 (SUTURE) IMPLANT
SUT SILK  1 MH (SUTURE) ×3
SUT SILK 1 MH (SUTURE) ×8 IMPLANT
SUT SILK 1 TIES 10X30 (SUTURE) IMPLANT
SUT SILK 2 0 SH (SUTURE) IMPLANT
SUT SILK 2 0SH CR/8 30 (SUTURE) IMPLANT
SUT SILK 3 0SH CR/8 30 (SUTURE) IMPLANT
SUT VIC AB 0 CTX 18 (SUTURE) ×3 IMPLANT
SUT VIC AB 1 CTX 18 (SUTURE) IMPLANT
SUT VIC AB 1 CTX 36 (SUTURE)
SUT VIC AB 1 CTX36XBRD ANBCTR (SUTURE) IMPLANT
SUT VIC AB 2-0 CTX 36 (SUTURE) IMPLANT
SUT VIC AB 2-0 UR6 27 (SUTURE) IMPLANT
SUT VIC AB 3-0 SH 8-18 (SUTURE) IMPLANT
SUT VIC AB 3-0 X1 27 (SUTURE) ×2 IMPLANT
SUT VICRYL 0 UR6 27IN ABS (SUTURE) IMPLANT
SUT VICRYL 2 TP 1 (SUTURE) IMPLANT
SUT VICRYL 3 0 UR 6 27 (SUTURE) ×1 IMPLANT
SYR 10ML LL (SYRINGE) ×1 IMPLANT
SYR 20ML ECCENTRIC (SYRINGE) ×3 IMPLANT
SYR 50ML LL SCALE MARK (SYRINGE) ×3 IMPLANT
SYR 5ML LL (SYRINGE) ×3 IMPLANT
SYSTEM SAHARA CHEST DRAIN ATS (WOUND CARE) ×3 IMPLANT
TAPE CLOTH 4X10 WHT NS (GAUZE/BANDAGES/DRESSINGS) ×3 IMPLANT
TAPE CLOTH SURG 4X10 WHT LF (GAUZE/BANDAGES/DRESSINGS) ×1 IMPLANT
TAPE UMBILICAL COTTON 1/8X30 (MISCELLANEOUS) IMPLANT
TIP APPLICATOR SPRAY EXTEND 16 (VASCULAR PRODUCTS) IMPLANT
TOWEL GREEN STERILE (TOWEL DISPOSABLE) ×3 IMPLANT
TOWEL GREEN STERILE FF (TOWEL DISPOSABLE) ×3 IMPLANT
TRAP SPECIMEN MUCOUS 40CC (MISCELLANEOUS) IMPLANT
TRAY FOLEY MTR SLVR 16FR STAT (SET/KITS/TRAYS/PACK) ×3 IMPLANT
TROCAR BLADELESS 12MM (ENDOMECHANICALS) ×2 IMPLANT
TROCAR BLADELESS 5M (ENDOMECHANICALS) ×1 IMPLANT
TROCAR XCEL 12X100 BLDLESS (ENDOMECHANICALS) IMPLANT
TUBE CONNECTING 20X1/4 (TUBING) ×3 IMPLANT
TUBING EXTENTION W/L.L. (IV SETS) ×3 IMPLANT
TUNNELER SHEATH ON-Q 11GX8 DSP (PAIN MANAGEMENT) IMPLANT
VALVE BIOPSY  SINGLE USE (MISCELLANEOUS) ×1
VALVE BIOPSY SINGLE USE (MISCELLANEOUS) ×2 IMPLANT
VALVE SUCTION BRONCHIO DISP (MISCELLANEOUS) ×3 IMPLANT
WATER STERILE IRR 1000ML POUR (IV SOLUTION) ×6 IMPLANT

## 2019-11-10 NOTE — Anesthesia Preprocedure Evaluation (Addendum)
Anesthesia Evaluation  Patient identified by MRN, date of birth, ID band Patient awake    Reviewed: Allergy & Precautions, NPO status , Patient's Chart, lab work & pertinent test results  History of Anesthesia Complications Negative for: history of anesthetic complications  Airway Mallampati: II  TM Distance: >3 FB Neck ROM: Full    Dental  (+) Dental Advisory Given, Poor Dentition, Chipped   Pulmonary former smoker,   Hx Rt PTX with blebs    Pulmonary exam normal        Cardiovascular hypertension, Pt. on medications Normal cardiovascular exam     Neuro/Psych negative neurological ROS  negative psych ROS   GI/Hepatic negative GI ROS, (+)     substance abuse  marijuana use,   Endo/Other  negative endocrine ROS  Renal/GU negative Renal ROS     Musculoskeletal negative musculoskeletal ROS (+)   Abdominal   Peds  Hematology negative hematology ROS (+)   Anesthesia Other Findings   Reproductive/Obstetrics                            Anesthesia Physical Anesthesia Plan  ASA: II  Anesthesia Plan: General   Post-op Pain Management:    Induction: Intravenous  PONV Risk Score and Plan: 3 and Treatment may vary due to age or medical condition, Ondansetron, Midazolam and Dexamethasone  Airway Management Planned: Double Lumen EBT  Additional Equipment: Arterial line  Intra-op Plan:   Post-operative Plan: Extubation in OR  Informed Consent: I have reviewed the patients History and Physical, chart, labs and discussed the procedure including the risks, benefits and alternatives for the proposed anesthesia with the patient or authorized representative who has indicated his/her understanding and acceptance.     Dental advisory given  Plan Discussed with: CRNA and Anesthesiologist  Anesthesia Plan Comments:       Anesthesia Quick Evaluation

## 2019-11-10 NOTE — Brief Op Note (Signed)
      [address]       [place] [address]             [phone]       11/10/2019  3:45 PM  PATIENT:  Ray [name]  58 y.o. male  PRE-OPERATIVE DIAGNOSIS:  Left BLEBS with spontaneous pneumothorax  POST-OPERATIVE DIAGNOSIS:  same  PROCEDURE:  Procedure(s): VIDEO BRONCHOSCOPY (N/A) VIDEO ASSISTED THORACOSCOPY (Left) STAPLING OF BLEBS (Left)  SURGEON:  Surgeon(s) and Role:    * Grace Isaac, MD - Primary  PHYSICIAN ASSISTANT: Ellwood Handler, PA   ANESTHESIA:   general  EBL:  25 mL   BLOOD ADMINISTERED:none  DRAINS: 28 Blake left chest tube    LOCAL MEDICATIONS USED:  BUPIVICAINE   SPECIMEN:  Source of Specimen:  apical blebs left upper lobe x 2  DISPOSITION OF SPECIMEN:  PATHOLOGY  COUNTS:  YES  TOURNIQUET:  * No tourniquets in log *  DICTATION: .Dragon Dictation  PLAN OF CARE: has admission order in place   PATIENT DISPOSITION:  PACU - hemodynamically stable.   Delay start of Pharmacological VTE agent (>24hrs) due to surgical blood loss or risk of bleeding: yes

## 2019-11-10 NOTE — Progress Notes (Signed)
Daily Nursing Note  Report received from [place], [place]. Patient planned for VATS this afternoon. Has been NPO since midnight. Chlorhexidine bath completed. Chest area prepped. Pre-procedure checklist completed. Report given to Manuela Schwartz in short stay.  Patients girlfriend came this morning to keep him company.  All questions answered appropriately.

## 2019-11-10 NOTE — Anesthesia Procedure Notes (Signed)
Procedure Name: Intubation Date/Time: 11/10/2019 1:46 PM Performed by: Colin Benton, CRNA Pre-anesthesia Checklist: Patient identified, Emergency Drugs available, Suction available and Patient being monitored Patient Re-evaluated:Patient Re-evaluated prior to induction Oxygen Delivery Method: Circle System Utilized Preoxygenation: Pre-oxygenation with 100% oxygen Induction Type: IV induction Ventilation: Mask ventilation without difficulty Laryngoscope Size: Mac and 4 Grade View: Grade III Tube type: Oral Endobronchial tube: Double lumen EBT and 39 Fr Number of attempts: 2 (attempted by SRNA placed by CRNA) Airway Equipment and Method: Stylet and Oral airway Placement Confirmation: ETT inserted through vocal cords under direct vision,  positive ETCO2 and breath sounds checked- equal and bilateral Tube secured with: Tape Dental Injury: Teeth and Oropharynx as per pre-operative assessment

## 2019-11-10 NOTE — Plan of Care (Signed)
Problem: Education: Goal: Knowledge of General Education information will improve Description: Including pain rating scale, medication(s)/side effects and non-pharmacologic comfort measures Outcome: Adequate for Discharge   Problem: Health Behavior/Discharge Planning: Goal: Ability to manage health-related needs will improve Outcome: Adequate for Discharge   Problem: Clinical Measurements: Goal: Ability to maintain clinical measurements within normal limits will improve Outcome: Adequate for Discharge Goal: Will remain free from infection Outcome: Adequate for Discharge Goal: Diagnostic test results will improve Outcome: Adequate for Discharge Goal: Respiratory complications will improve Outcome: Adequate for Discharge Goal: Cardiovascular complication will be avoided Outcome: Adequate for Discharge   Problem: Activity: Goal: Risk for activity intolerance will decrease Outcome: Adequate for Discharge   Problem: Nutrition: Goal: Adequate nutrition will be maintained Outcome: Adequate for Discharge   Problem: Coping: Goal: Level of anxiety will decrease Outcome: Adequate for Discharge   Problem: Elimination: Goal: Will not experience complications related to bowel motility Outcome: Adequate for Discharge Goal: Will not experience complications related to urinary retention Outcome: Adequate for Discharge   Problem: Pain Managment: Goal: General experience of comfort will improve Outcome: Adequate for Discharge   Problem: Safety: Goal: Ability to remain free from injury will improve Outcome: Adequate for Discharge   Problem: Skin Integrity: Goal: Risk for impaired skin integrity will decrease Outcome: Adequate for Discharge   Problem: Education: Goal: Knowledge of General Education information will improve Description: Including pain rating scale, medication(s)/side effects and non-pharmacologic comfort measures Outcome: Adequate for Discharge   Problem: Health  Behavior/Discharge Planning: Goal: Ability to manage health-related needs will improve Outcome: Adequate for Discharge   Problem: Clinical Measurements: Goal: Ability to maintain clinical measurements within normal limits will improve Outcome: Adequate for Discharge Goal: Will remain free from infection Outcome: Adequate for Discharge Goal: Diagnostic test results will improve Outcome: Adequate for Discharge Goal: Respiratory complications will improve Outcome: Adequate for Discharge Goal: Cardiovascular complication will be avoided Outcome: Adequate for Discharge   Problem: Activity: Goal: Risk for activity intolerance will decrease Outcome: Adequate for Discharge   Problem: Nutrition: Goal: Adequate nutrition will be maintained Outcome: Adequate for Discharge   Problem: Coping: Goal: Level of anxiety will decrease Outcome: Adequate for Discharge   Problem: Elimination: Goal: Will not experience complications related to bowel motility Outcome: Adequate for Discharge Goal: Will not experience complications related to urinary retention Outcome: Adequate for Discharge   Problem: Pain Managment: Goal: General experience of comfort will improve Outcome: Adequate for Discharge   Problem: Safety: Goal: Ability to remain free from injury will improve Outcome: Adequate for Discharge   Problem: Skin Integrity: Goal: Risk for impaired skin integrity will decrease Outcome: Adequate for Discharge   Problem: Education: Goal: Knowledge of General Education information will improve Description: Including pain rating scale, medication(s)/side effects and non-pharmacologic comfort measures Outcome: Progressing   Problem: Health Behavior/Discharge Planning: Goal: Ability to manage health-related needs will improve Outcome: Progressing   Problem: Clinical Measurements: Goal: Ability to maintain clinical measurements within normal limits will improve Outcome: Progressing Goal:  Will remain free from infection Outcome: Progressing Goal: Diagnostic test results will improve Outcome: Progressing Goal: Respiratory complications will improve Outcome: Progressing Goal: Cardiovascular complication will be avoided Outcome: Progressing   Problem: Activity: Goal: Risk for activity intolerance will decrease Outcome: Progressing   Problem: Nutrition: Goal: Adequate nutrition will be maintained Outcome: Progressing   Problem: Coping: Goal: Level of anxiety will decrease Outcome: Progressing   Problem: Elimination: Goal: Will not experience complications related to bowel motility Outcome: Progressing Goal: Will not experience  complications related to urinary retention Outcome: Progressing   Problem: Pain Managment: Goal: General experience of comfort will improve Outcome: Progressing   Problem: Safety: Goal: Ability to remain free from injury will improve Outcome: Progressing   Problem: Skin Integrity: Goal: Risk for impaired skin integrity will decrease Outcome: Progressing   Problem: Skin Integrity: Goal: Demonstration of wound healing without infection will improve Outcome: Progressing

## 2019-11-10 NOTE — Anesthesia Procedure Notes (Signed)
Arterial Line Insertion Start/End11/17/2020 12:50 PM, 11/10/2019 12:55 PM Performed by: Teressa Lower., CRNA, CRNA  Patient location: Pre-op. Preanesthetic checklist: patient identified, IV checked, site marked, risks and benefits discussed, surgical consent, monitors and equipment checked, pre-op evaluation, timeout performed and anesthesia consent Lidocaine 1% used for infiltration Right, radial was placed Catheter size: 20 G Hand hygiene performed , maximum sterile barriers used  and Seldinger technique used Allen's test indicative of satisfactory collateral circulation Attempts: 1 Procedure performed without using ultrasound guided technique. Following insertion, dressing applied and Biopatch. Post procedure assessment: normal and unchanged  Patient tolerated the procedure well with no immediate complications.

## 2019-11-10 NOTE — Progress Notes (Signed)
      [address]       [place] [address]             [phone]    Pre Procedure note for inpatients:   Ray [name] has been scheduled for Procedure(s): VIDEO BRONCHOSCOPY (N/A) VIDEO ASSISTED THORACOSCOPY (Left) STAPLING OF BLEBS (Left) today. The various methods of treatment have been discussed with the patient. After consideration of the risks, benefits and treatment options the patient has consented to the planned procedure.   The patient has been seen and labs reviewed. There are no changes in the patient's condition to prevent proceeding with the planned procedure today.  Recent labs:  Lab Results  Component Value Date   WBC 5.5 11/09/2019   HGB 12.7 (L) 11/09/2019   HCT 37.5 (L) 11/09/2019   PLT 245 11/09/2019   GLUCOSE 117 (H) 11/09/2019   ALT 34 11/09/2019   AST 38 11/09/2019   NA 138 11/09/2019   K 3.8 11/09/2019   CL 105 11/09/2019   CREATININE 0.98 11/09/2019   BUN <5 (L) 11/09/2019   CO2 24 11/09/2019   INR 1.0 11/09/2019   Left side marked    Grace Isaac, MD 11/10/2019 12:48 PM

## 2019-11-10 NOTE — Progress Notes (Addendum)
PROGRESS NOTE    Ray [name]  ZOX:[id number] DOB: 12-02-1961 DOA: 11/05/2019  PCP: Mirna MiresHill, Gerald, MD    LOS - 4   Brief Narrative:  58 year old African-American male with past medical history significant for hypertension, atelectasis and prior history of pneumothorax (right-sided).  Patient was admitted with left shoulder and chest pain.  Work-up done revealed moderate left-sided pneumothorax.  Cardiothoracic surgery was consulted.  Patient has left-sided chest tube to waterseal.  Patient's lung has re-expanded.  Cardiothoracic team is directing care.    Hospitalist service is consulted for blood pressure control.  CTS team planning for VATS procedure today.   Subjective 11/17: Patient awake, sitting up in bed when seen and examined, daughter at bedside.  He reports some irritation at the chest tube site.  Reports anxiety over surgery today.  He denies fevers, chills, chest pain, shortness of breath or other acute complaints.  No acute events reported overnight.  Assessment & Plan:   Principal Problem:   Pneumothorax Active Problems:   Hypertension   Hypokalemia   Left spontaneous pneumothorax:  -Patient has recurrent spontaneous pneumothorax.  Left-sided chest tube to waterseal.  Cardiothoracic team is managing.  Lung has reexpanded.   -CTS plans for VATS today  Hypokalemia - repleted -Patient has been on bisoprolol/HCTZ - discointinued - monitor BMP's  Hypomagnesemia - repleted -Mag level with AM labs.  Hypertension -poorly controlled -Home regimen on admission was bisoprolol/HCTZ, Cozaar -both discontinued -Started on lisinopril 20 mg increased to 40 mg -continue -Started Norvasc 10 mg daily -continued -Monitor blood pressure closely  -If hypokalemia persists, and blood pressure is not controlled, have low threshold to work patient up for primary aldosteronism.  Hyperlipidemia Continue Lipitor 10 mg daily  GERD Continue Protonix 40 mg daily  Insomnia  Continue trazodone 50 mg at night as needed    DVT prophylaxis: SCD's   Code Status: Full Code  Family Communication: daughter at bedside today, 11/17  Disposition Plan: Per cardiothoracic surgery.  Expect discharge home.   Consultants:   CT surgery, primary  Procedures:   Pigtail catheter insertion  VATS planned for today  Antimicrobials:   None   Objective: Vitals:   11/09/19 2000 11/09/19 2309 11/10/19 0333 11/10/19 0734  BP:  (!) 142/83 (!) 151/87   Pulse:  80    Resp:  18    Temp:  98.4 F (36.9 C) 98.3 F (36.8 C) 98.7 F (37.1 C)  TempSrc:  Oral Oral Oral  SpO2:  100%    Weight:   70 kg   Height: 6' (1.829 m)       Intake/Output Summary (Last 24 hours) at 11/10/2019 1246 Last data filed at 11/10/2019 1000 Gross per 24 hour  Intake 240 ml  Output 42 ml  Net 198 ml   Filed Weights   11/08/19 0400 11/09/19 0339 11/10/19 0333  Weight: 68.1 kg 66.5 kg 70 kg    Examination:  General exam: awake, alert, no acute distress HEENT: atraumatic, clear conjunctiva, anicteric sclera, moist mucus membranes, hearing grossly normal  Respiratory system: clear to auscultation, but decreased breath sounds on the left, no wheezes, rales or rhonchi, normal respiratory effort.  Chest tube in place. Cardiovascular system: normal S1/S2, RRR, no JVD, murmurs, rubs, gallops, no pedal edema.   Gastrointestinal system: soft, non-tender, non-distended abdomen Central nervous system: alert and oriented x4. no gross focal neurologic deficits, normal speech Extremities: moves all, no edema, normal tone Psychiatry: normal mood, congruent affect, judgement and insight appear normal  Data Reviewed: I have personally reviewed following labs and imaging studies  CBC: Recent Labs  Lab 11/06/19 0151 11/09/19 1905  WBC 8.2 5.5  NEUTROABS 6.1  --   HGB 12.4* 12.7*  HCT 37.9* 37.5*  MCV 99.2 97.9  PLT 305 245   Basic Metabolic Panel: Recent Labs  Lab 11/06/19 0151  11/07/19 0947 11/08/19 0445 11/09/19 0346 11/09/19 1905  NA 140 138 139 138 138  K 2.9* 3.0* 3.6 3.6 3.8  CL 100 103 103 104 105  CO2 23 27 26 25 24   GLUCOSE 124* 142* 114* 103* 117*  BUN 7 5* <5* <5* <5*  CREATININE 1.09 1.00 0.96 0.99 0.98  CALCIUM 9.1 8.4* 8.8* 8.8* 9.0  MG  --  1.5* 1.9 1.7  --   PHOS  --  1.6* 1.7* 2.1*  --    GFR: Estimated Creatinine Clearance: 83.3 mL/min (by C-G formula based on SCr of 0.98 mg/dL). Liver Function Tests: Recent Labs  Lab 11/07/19 0947 11/08/19 0445 11/09/19 0346 11/09/19 1905  AST  --   --   --  38  ALT  --   --   --  34  ALKPHOS  --   --   --  54  BILITOT  --   --   --  0.4  PROT  --   --   --  7.0  ALBUMIN 3.2* 3.1* 3.0* 3.6   No results for input(s): LIPASE, AMYLASE in the last 168 hours. No results for input(s): AMMONIA in the last 168 hours. Coagulation Profile: Recent Labs  Lab 11/06/19 0151 11/09/19 1905  INR 0.9 1.0   Cardiac Enzymes: No results for input(s): CKTOTAL, CKMB, CKMBINDEX, TROPONINI in the last 168 hours. BNP (last 3 results) No results for input(s): PROBNP in the last 8760 hours. HbA1C: No results for input(s): HGBA1C in the last 72 hours. CBG: No results for input(s): GLUCAP in the last 168 hours. Lipid Profile: No results for input(s): CHOL, HDL, LDLCALC, TRIG, CHOLHDL, LDLDIRECT in the last 72 hours. Thyroid Function Tests: No results for input(s): TSH, T4TOTAL, FREET4, T3FREE, THYROIDAB in the last 72 hours. Anemia Panel: No results for input(s): VITAMINB12, FOLATE, FERRITIN, TIBC, IRON, RETICCTPCT in the last 72 hours. Sepsis Labs: No results for input(s): PROCALCITON, LATICACIDVEN in the last 168 hours.  Recent Results (from the past 240 hour(s))  SARS CORONAVIRUS 2 (TAT 6-24 HRS) Nasopharyngeal Nasopharyngeal Swab     Status: None   Collection Time: 11/06/19  2:14 AM   Specimen: Nasopharyngeal Swab  Result Value Ref Range Status   SARS Coronavirus 2 NEGATIVE NEGATIVE Final    Comment:  (NOTE) SARS-CoV-2 target nucleic acids are NOT DETECTED. The SARS-CoV-2 RNA is generally detectable in upper and lower respiratory specimens during the acute phase of infection. Negative results do not preclude SARS-CoV-2 infection, do not rule out co-infections with other pathogens, and should not be used as the sole basis for treatment or other patient management decisions. Negative results must be combined with clinical observations, patient history, and epidemiological information. The expected result is Negative. Fact Sheet for Patients: 11/08/19 Fact Sheet for Healthcare Providers: HairSlick.no This test is not yet approved or cleared by the quierodirigir.com FDA and  has been authorized for detection and/or diagnosis of SARS-CoV-2 by FDA under an Emergency Use Authorization (EUA). This EUA will remain  in effect (meaning this test can be used) for the duration of the COVID-19 declaration under Section 56 4(b)(1) of the Act, 21 U.S.C.  section 360bbb-3(b)(1), unless the authorization is terminated or revoked sooner. Performed at [facility], [place] [address], [place], [place] [address]   MRSA PCR Screening     Status: None   Collection Time: 11/10/19  7:41 AM   Specimen: Nasal Mucosa; Nasopharyngeal  Result Value Ref Range Status   MRSA by PCR NEGATIVE NEGATIVE Final    Comment:        The GeneXpert MRSA Assay (FDA approved for NASAL specimens only), is one component of a comprehensive MRSA colonization surveillance program. It is not intended to diagnose MRSA infection nor to guide or monitor treatment for MRSA infections. Performed at [facility], [place] [address], [place], [place] [address]          Radiology Studies: No results found.      Scheduled Meds: . [MAR Hold] amLODipine  10 mg Oral Daily  . [MAR Hold] atorvastatin  10 mg Oral QHS  . [MAR Hold] docusate sodium  100 mg Oral  BID  . [MAR Hold] enoxaparin (LOVENOX) injection  40 mg Subcutaneous Daily  . [MAR Hold] lisinopril  40 mg Oral Daily  . [MAR Hold] ondansetron (ZOFRAN) IV  4 mg Intravenous Once  . [MAR Hold] pantoprazole  40 mg Oral Daily  . [MAR Hold] polyethylene glycol  17 g Oral Daily   Continuous Infusions: .  ceFAZolin (ANCEF) IV    . lactated ringers       LOS: 4 days    Time spent: 25-30 minutes    Ezekiel Slocumb, DO [facility] Pager: [phone]  If 7PM-7AM, please contact night-coverage [email or web address] Password [facility] 11/10/2019, 12:46 PM

## 2019-11-10 NOTE — Transfer of Care (Signed)
Immediate Anesthesia Transfer of Care Note  Patient: Ray [name]  Procedure(s) Performed: VIDEO BRONCHOSCOPY (N/A ) VIDEO ASSISTED THORACOSCOPY (Left Chest) STAPLING OF BLEBS (Left Chest)  Patient Location: PACU  Anesthesia Type:General  Level of Consciousness: awake, alert , oriented and patient cooperative  Airway & Oxygen Therapy: Patient Spontanous Breathing and Patient connected to face mask oxygen  Post-op Assessment: Report given to RN, Post -op Vital signs reviewed and stable and Patient moving all extremities X 4  Post vital signs: Reviewed and stable  Last Vitals:  Vitals Value Taken Time  BP 164/96 11/10/19 1552  Temp    Pulse 87 11/10/19 1555  Resp 25 11/10/19 1555  SpO2 99 % 11/10/19 1555  Vitals shown include unvalidated device data.  Last Pain:  Vitals:   11/10/19 0900  TempSrc:   PainSc: 5       Patients Stated Pain Goal: 0 (82/42/35 3614)  Complications: No apparent anesthesia complications

## 2019-11-10 NOTE — Anesthesia Postprocedure Evaluation (Signed)
Anesthesia Post Note  Patient: Ray [name]  Procedure(s) Performed: VIDEO BRONCHOSCOPY (N/A ) VIDEO ASSISTED THORACOSCOPY (Left Chest) STAPLING OF BLEBS (Left Chest)     Patient location during evaluation: PACU Anesthesia Type: General Level of consciousness: awake and alert Pain management: pain level controlled Vital Signs Assessment: post-procedure vital signs reviewed and stable Respiratory status: spontaneous breathing, nonlabored ventilation, respiratory function stable and patient connected to nasal cannula oxygen Cardiovascular status: stable (Persistently hypertensive. Patient states he is comfortable. SBP brought under 265mmHg after several doses of labetalol and hydralazine ) Postop Assessment: no apparent nausea or vomiting Anesthetic complications: no    Last Vitals:  Vitals:   11/10/19 1640 11/10/19 1655  BP: (!) 158/80 136/73  Pulse: 91 90  Resp: 15 16  Temp:    SpO2: 98% 99%    Last Pain:  Vitals:   11/10/19 1625  TempSrc:   PainSc: [place]

## 2019-11-11 ENCOUNTER — Encounter (HOSPITAL_COMMUNITY): Payer: Self-pay | Admitting: Cardiothoracic Surgery

## 2019-11-11 ENCOUNTER — Other Ambulatory Visit: Payer: Self-pay

## 2019-11-11 ENCOUNTER — Inpatient Hospital Stay (HOSPITAL_COMMUNITY): Payer: 59

## 2019-11-11 LAB — CBC
HCT: 34.7 % — ABNORMAL LOW (ref 39.0–52.0)
Hemoglobin: 11.4 g/dL — ABNORMAL LOW (ref 13.0–17.0)
MCH: 32.4 pg (ref 26.0–34.0)
MCHC: 32.9 g/dL (ref 30.0–36.0)
MCV: 98.6 fL (ref 80.0–100.0)
Platelets: 266 10*3/uL (ref 150–400)
RBC: 3.52 MIL/uL — ABNORMAL LOW (ref 4.22–5.81)
RDW: 12.9 % (ref 11.5–15.5)
WBC: 9.1 10*3/uL (ref 4.0–10.5)
nRBC: 0 % (ref 0.0–0.2)

## 2019-11-11 LAB — BASIC METABOLIC PANEL
Anion gap: 11 (ref 5–15)
BUN: 9 mg/dL (ref 6–20)
CO2: 21 mmol/L — ABNORMAL LOW (ref 22–32)
Calcium: 8.5 mg/dL — ABNORMAL LOW (ref 8.9–10.3)
Chloride: 104 mmol/L (ref 98–111)
Creatinine, Ser: 0.99 mg/dL (ref 0.61–1.24)
GFR calc Af Amer: >60 mL/min (ref >60)
GFR calc non Af Amer: >60 mL/min (ref >60)
Glucose, Bld: 125 mg/dL — ABNORMAL HIGH (ref 70–99)
Potassium: 3.9 mmol/L (ref 3.5–5.1)
Sodium: 136 mmol/L (ref 135–145)

## 2019-11-11 LAB — MAGNESIUM: Magnesium: 1.6 mg/dL — ABNORMAL LOW (ref 1.7–2.4)

## 2019-11-11 LAB — GLUCOSE, CAPILLARY
Glucose-Capillary: 120 mg/dL — ABNORMAL HIGH (ref 70–99)
Glucose-Capillary: 92 mg/dL (ref 70–99)
Glucose-Capillary: 102 mg/dL — ABNORMAL HIGH (ref 70–99)

## 2019-11-11 MED ORDER — LOSARTAN POTASSIUM 50 MG PO TABS
100.0000 mg | ORAL_TABLET | Freq: Every day | ORAL | Status: DC
  Administered 2019-11-11 – 2019-11-12 (×2): 100 mg via ORAL
  Filled 2019-11-11 (×2): qty 2

## 2019-11-11 MED ORDER — BISOPROLOL-HYDROCHLOROTHIAZIDE 10-6.25 MG PO TABS
1.0000 | ORAL_TABLET | Freq: Every day | ORAL | Status: DC
  Administered 2019-11-11 – 2019-11-12 (×2): 1 via ORAL
  Filled 2019-11-11 (×2): qty 1

## 2019-11-11 MED ORDER — MAGNESIUM SULFATE 2 GM/50ML IV SOLN
2.0000 g | Freq: Once | INTRAVENOUS | Status: AC
  Administered 2019-11-11: 2 g via INTRAVENOUS
  Filled 2019-11-11: qty 50

## 2019-11-11 MED ORDER — KETOROLAC TROMETHAMINE 15 MG/ML IJ SOLN
15.0000 mg | Freq: Three times a day (TID) | INTRAMUSCULAR | Status: DC | PRN
  Administered 2019-11-11: 15 mg via INTRAVENOUS
  Filled 2019-11-11: qty 1

## 2019-11-11 NOTE — Op Note (Signed)
NAMEKOHEN, REITHER MEDICAL RECORD [id number] ACCOUNT [id number] DATE OF BIRTH:April 10, 1961 FACILITY: [facility] LOCATION: [facility] PHYSICIAN:Lester Crickenberger Maryruth Bun, MD  OPERATIVE REPORT  DATE OF PROCEDURE:  11/10/2019  PREOPERATIVE DIAGNOSIS:  Spontaneous left pneumothorax with multiple apical bullae.  POSTOPERATIVE DIAGNOSIS:  Spontaneous left pneumothorax with multiple apical bullae.  SURGICAL PROCEDURE:  Bronchoscopy, left video-assisted thoracoscopy, stapling of apical blebs, mechanical pleurodesis, intercostal nerve block with Exparel.  SURGEON:  Lanelle Bal, MD  FIRST ASSISTANT:  Ellwood Handler, PA-C  BRIEF HISTORY:  The patient is a 58 year old male with known pulmonary disease, having had spontaneous pneumo on the right requiring stapling of apical blebs in 2018.  The patient had done well until 3 days ago when he presented to the [facility]  Emergency Room with a spontaneous left pneumothorax.  He had not previously had a pneumothorax on the left.  A chest tube was placed while the patient was at [facility].  The patient was then transferred to [facility].  CT scan of the chest was done  which confirmed at least 2 moderate size apical blebs that were the likely source of the air leak.  Thoracic surgery was consulted.  After discussion with the patient although it had been the 1st episode with the moderate sized blebs present and his  previous history on the right side, when a chest tube had been placed initially and he had early recurrence, ultimately requiring referral to thoracic surgery and stapling of the blebs on the right.  He wished to proceed with definitive therapy on this  occasion.  Risks and options were discussed with him in detail.  DESCRIPTION OF PROCEDURE:  The patient underwent general endotracheal anesthesia without incident with a double lumen endotracheal tube.  Through the double lumen endotracheal tube, a fiberoptic bronchoscopy was done to the subsegmental  level.  No  endobronchial lesions were appreciated.  The patient was without significant secretions and the double lumen endotracheal tube was in good position.  Scope was removed.  The patient was turned in lateral decubitus position with the left side up.  The  left had preoperatively been marked.  The left chest was prepped with Betadine, draped in a sterile manner.  A 2nd timeout was performed.  We then proceeded with a small 5 mm port incision about the 6th intercostal space posterior axillary line.  A  30-degree 5 mm scope was introduced into the left chest, slight insufflation.  We obtained good collapse of the left lung.  A survey of the pleural space was done.  There were no pleural lesions appreciated.  There were several small bands attaching the  apex to the chest wall and 1 large bleb and 1 smaller bleb associated with the left upper lobe.  Two additional ports, 1 more anterior and 1 more posterior and superiorly placed from our initial port were placed under direct vision.  Through these,  grabbers were used to elevate the lung, identify the small bands.  Electrocautery scissors were used to divide the bands.  This freed the apex of the lung and we were able to fully visualize 2 areas of bleb formation, 1 larger than the other.  The  smaller bleb was then elevated, visualized and using a 45 Covidien purple load stapler through 1 port incision, the bleb was stapled and removed through the port.  We then repositioned our visualization, elevating the remaining bleb, which was longer and  was also stapled in a similar fashion requiring 2 staple loads.  The specimen  was also brought out through the port site and both were submitted to pathology.  A small abrasive pad was then used for mechanical pleurodesis.  With the head down and a  suction irrigator in place, we tested for air leaks and did not identify any air leak.  Using an Exparel bupivacaine saline solution the intercostal spaces at the  sites of our port placement were injected for neural blocks.  We then placed a 28 chest  tube through the camera port and the other 2 ports were closed with interrupted 2-0 Vicryl sutures and a 4-0 subcuticular stitch.  Dermabond was applied.  The lung reinflated nicely.  There was no air leak at the completion of the procedure from the  chest tube.  Sponge and needle count was reported as correct.  Blood loss was less than 25 mL.  The patient was extubated in the operating room and transferred to the recovery room having tolerated the procedure without an obvious complication.  CN/NUANCE  D:11/11/2019 T:11/11/2019 JOB:[id number]

## 2019-11-11 NOTE — Progress Notes (Addendum)
PROGRESS NOTE    Ray [name]  ACZ:[id number] DOB: 11-14-1961 DOA: 11/05/2019 PCP: Mirna Mires, MD     Brief Narrative:  Ray [name] is a 58 year old African-American male with past medical history significant for hypertension, atelectasis and prior history of pneumothorax (right-sided). Patient was admitted with left shoulder and chest pain. Work-up done revealed moderate left-sided pneumothorax. Cardiothoracic surgery was consulted. Patient has left-sided chest tube to waterseal. Patient's lung has re-expanded, underwent VATS 11/17.   New events last 24 hours / Subjective: No complaints today, breathing seems to be stable.  States that he has not gotten much sleep due to constant staff interruptions.  He did have some lip and tongue swelling postoperatively, no trouble with breathing or swallowing.  Swelling has improved but continues to have some lip swelling today  Assessment & Plan:   Principal Problem:   Pneumothorax Active Problems:   Hypertension   Hypokalemia   Left spontaneous pneumothorax: -Status post VATS -Per cardiothoracic surgery team  Hypertension -Home regimen on admission was bisoprolol/HCTZ, Cozaar  -Started on lisinopril but now discontinued due to patient's complaint of lip and tongue swelling -Continue Norvasc, bisoprolol-HCTZ, Cozaar  Hyperlipidemia -Continue Lipitor   GERD -Continue Protonix   Insomnia -Continue trazodone prn   Hypomagnesemia -Replace     DVT prophylaxis: Lovenox Code Status: Full code Family Communication: No family at bedside Disposition Plan: Pending clearance for discharge from cardiothoracic surgery team    Antimicrobials:  Anti-infectives (From admission, onward)   Start     Dose/Rate Route Frequency Ordered Stop   11/10/19 2200  ceFAZolin (ANCEF) IVPB 2g/100 mL premix     2 g 200 mL/hr over 30 Minutes Intravenous Every 8 hours 11/10/19 2008 11/11/19 0550   11/10/19 0000  ceFAZolin (ANCEF) IVPB  2g/100 mL premix     2 g 200 mL/hr over 30 Minutes Intravenous 30 min pre-op 11/09/19 1834 11/10/19 1400        Objective: Vitals:   11/11/19 0500 11/11/19 0800 11/11/19 0814 11/11/19 0900  BP:   (!) 159/78   Pulse:  79 75 82  Resp:  19 16 12   Temp:   98.9 F (37.2 C)   TempSrc:   Oral   SpO2:  100% 100% 98%  Weight: 69.1 kg     Height:        Intake/Output Summary (Last 24 hours) at 11/11/2019 1110 Last data filed at 11/11/2019 1021 Gross per 24 hour  Intake 3167.33 ml  Output 1045 ml  Net 2122.33 ml   Filed Weights   11/09/19 0339 11/10/19 0333 11/11/19 0500  Weight: 66.5 kg 70 kg 69.1 kg    Examination:  General exam: Appears calm and comfortable  Respiratory system: Clear to auscultation. Respiratory effort normal. No respiratory distress. No conversational dyspnea.  Cardiovascular system: S1 & S2 heard, RRR. No murmurs. No pedal edema. Gastrointestinal system: Abdomen is nondistended, soft and nontender. Normal bowel sounds heard. Central nervous system: Alert and oriented. No focal neurological deficits. Speech clear.  Extremities: Symmetric in appearance  Skin: No rashes, lesions or ulcers on exposed skin  Psychiatry: Judgement and insight appear normal. Mood & affect appropriate.   Data Reviewed: I have personally reviewed following labs and imaging studies  CBC: Recent Labs  Lab 11/06/19 0151 11/09/19 1905 11/11/19 0429  WBC 8.2 5.5 9.1  NEUTROABS 6.1  --   --   HGB 12.4* 12.7* 11.4*  HCT 37.9* 37.5* 34.7*  MCV 99.2 97.9 98.6  PLT 305 245 266  Basic Metabolic Panel: Recent Labs  Lab 11/07/19 0947 11/08/19 0445 11/09/19 0346 11/09/19 1905 11/11/19 0429  NA 138 139 138 138 136  K 3.0* 3.6 3.6 3.8 3.9  CL 103 103 104 105 104  CO2 27 26 25 24  21*  GLUCOSE 142* 114* 103* 117* 125*  BUN 5* <5* <5* <5* 9  CREATININE 1.00 0.96 0.99 0.98 0.99  CALCIUM 8.4* 8.8* 8.8* 9.0 8.5*  MG 1.5* 1.9 1.7  --  1.6*  PHOS 1.6* 1.7* 2.1*  --   --    GFR:  Estimated Creatinine Clearance: 81.4 mL/min (by C-G formula based on SCr of 0.99 mg/dL). Liver Function Tests: Recent Labs  Lab 11/07/19 0947 11/08/19 0445 11/09/19 0346 11/09/19 1905  AST  --   --   --  38  ALT  --   --   --  34  ALKPHOS  --   --   --  54  BILITOT  --   --   --  0.4  PROT  --   --   --  7.0  ALBUMIN 3.2* 3.1* 3.0* 3.6   No results for input(s): LIPASE, AMYLASE in the last 168 hours. No results for input(s): AMMONIA in the last 168 hours. Coagulation Profile: Recent Labs  Lab 11/06/19 0151 11/09/19 1905  INR 0.9 1.0   Cardiac Enzymes: No results for input(s): CKTOTAL, CKMB, CKMBINDEX, TROPONINI in the last 168 hours. BNP (last 3 results) No results for input(s): PROBNP in the last 8760 hours. HbA1C: No results for input(s): HGBA1C in the last 72 hours. CBG: Recent Labs  Lab 11/10/19 2113 11/11/19 0628 11/11/19 0829  GLUCAP 127* 120* 92   Lipid Profile: No results for input(s): CHOL, HDL, LDLCALC, TRIG, CHOLHDL, LDLDIRECT in the last 72 hours. Thyroid Function Tests: No results for input(s): TSH, T4TOTAL, FREET4, T3FREE, THYROIDAB in the last 72 hours. Anemia Panel: No results for input(s): VITAMINB12, FOLATE, FERRITIN, TIBC, IRON, RETICCTPCT in the last 72 hours. Sepsis Labs: No results for input(s): PROCALCITON, LATICACIDVEN in the last 168 hours.  Recent Results (from the past 240 hour(s))  SARS CORONAVIRUS 2 (TAT 6-24 HRS) Nasopharyngeal Nasopharyngeal Swab     Status: None   Collection Time: 11/06/19  2:14 AM   Specimen: Nasopharyngeal Swab  Result Value Ref Range Status   SARS Coronavirus 2 NEGATIVE NEGATIVE Final    Comment: (NOTE) SARS-CoV-2 target nucleic acids are NOT DETECTED. The SARS-CoV-2 RNA is generally detectable in upper and lower respiratory specimens during the acute phase of infection. Negative results do not preclude SARS-CoV-2 infection, do not rule out co-infections with other pathogens, and should not be used as the  sole basis for treatment or other patient management decisions. Negative results must be combined with clinical observations, patient history, and epidemiological information. The expected result is Negative. Fact Sheet for Patients: SugarRoll.be Fact Sheet for Healthcare Providers: https://www.woods-mathews.com/ This test is not yet approved or cleared by the [place] FDA and  has been authorized for detection and/or diagnosis of SARS-CoV-2 by FDA under an Emergency Use Authorization (EUA). This EUA will remain  in effect (meaning this test can be used) for the duration of the COVID-19 declaration under Section 56 4(b)(1) of the Act, 21 U.S.C. section 360bbb-3(b)(1), unless the authorization is terminated or revoked sooner. Performed at [facility], [place] [address], [place], [place] [address]   MRSA PCR Screening     Status: None   Collection Time: 11/10/19  7:41 AM  Specimen: Nasal Mucosa; Nasopharyngeal  Result Value Ref Range Status   MRSA by PCR NEGATIVE NEGATIVE Final    Comment:        The GeneXpert MRSA Assay (FDA approved for NASAL specimens only), is one component of a comprehensive MRSA colonization surveillance program. It is not intended to diagnose MRSA infection nor to guide or monitor treatment for MRSA infections. Performed at [facility], [address] [place], [place] [address]       Radiology Studies: Dg Chest Port 1 View  Result Date: 11/11/2019 CLINICAL DATA:  Follow-up chest tube placement EXAM: PORTABLE CHEST 1 VIEW COMPARISON:  11/10/2019 FINDINGS: Cardiac shadow is stable. Left thoracostomy catheter is again seen. The previously noted left pneumothorax is not as well appreciated on today's exam. Postsurgical changes are again noted on the left stable from the prior study. No focal infiltrate is seen. No acute bony abnormality is noted. IMPRESSION: Left chest tube in place without  definitive pneumothorax. Electronically Signed   By: Alcide CleverMark  Lukens M.D.   On: 11/11/2019 08:44   Dg Chest Port 1 View  Result Date: 11/10/2019 CLINICAL DATA:  Chest tube removal EXAM: PORTABLE CHEST 1 VIEW COMPARISON:  11/08/2019, 11/07/2019, 11/06/2019 FINDINGS: Removal of left-sided pigtail drainage catheter. Interim insertion of straight tip chest tube with tip at the medial left apex. Suspected tiny apical pneumothorax. New postsurgical changes in the medial left apex. Postsurgical changes at the right apex. Stable heart size. No acute airspace disease. Small amount of soft tissue gas in the left lower chest wall laterally IMPRESSION: 1. Removal of left pigtail chest drainage catheter. New postsurgical changes in the left suprahilar lung with placement of left chest tube with tip at the medial left apex. Suspected tiny left apical pneumothorax. Electronically Signed   By: Jasmine PangKim  Fujinaga M.D.   On: 11/10/2019 18:07      Scheduled Meds: . acetaminophen  1,000 mg Oral Q6H   Or  . acetaminophen (TYLENOL) oral liquid 160 mg/5 mL  1,000 mg Oral Q6H  . amLODipine  10 mg Oral Daily  . atorvastatin  10 mg Oral QHS  . bisacodyl  10 mg Oral Daily  . bisoprolol-hydrochlorothiazide  1 tablet Oral Daily  . Chlorhexidine Gluconate Cloth  6 each Topical Daily  . diphenhydrAMINE  25 mg Intravenous Once  . enoxaparin (LOVENOX) injection  40 mg Subcutaneous Daily  . fentaNYL   Intravenous Q4H  . insulin aspart  0-24 Units Subcutaneous Q6H  . losartan  100 mg Oral Daily  . pantoprazole  40 mg Oral Daily  . polyethylene glycol  17 g Oral Daily  . senna-docusate  1 tablet Oral QHS   Continuous Infusions: . sodium chloride    . 0.9 % NaCl with KCl 20 mEq / L 10 mL/hr at 11/11/19 1021     LOS: 5 days      Time spent: 40 minutes   Ray StainJennifer Dariyah Garduno, DO [facility] 11/11/2019, 11:10 AM   Available via Epic secure chat 7am-7pm After these hours, please refer to coverage provider listed on  [email or web address]

## 2019-11-11 NOTE — Progress Notes (Addendum)
      [address]       [place] [address]             [phone]      1 Day Post-Op Procedure(s) (LRB): VIDEO BRONCHOSCOPY (N/A) VIDEO ASSISTED THORACOSCOPY (Left) STAPLING OF BLEBS (Left)   Subjective:  Patient with pain, not getting full relief with pain medication.  He denies N/V  Objective: Vital signs in last 24 hours: Temp:  [97 F (36.1 C)-98.9 F (37.2 C)] 98.9 F (37.2 C) (11/18 0814) Pulse Rate:  [75-113] 75 (11/18 0814) Cardiac Rhythm: Normal sinus rhythm (11/18 0800) Resp:  [13-29] 16 (11/18 0814) BP: (130-169)/(69-96) 159/78 (11/18 0814) SpO2:  [96 %-100 %] 100 % (11/18 0814) Arterial Line BP: (150-214)/(65-86) 187/86 (11/18 0800) Weight:  [69.1 kg] 69.1 kg (11/18 0500)  Intake/Output from previous day: 11/17 0701 - 11/18 0700 In: 2794.9 [P.O.:240; I.V.:2347; IV Piggyback:207.9] Out: 830 [Urine:705; Blood:25; Chest Tube:100] Intake/Output this shift: Total I/O In: 438.6 [I.V.:338.6; IV Piggyback:100] Out: -   General appearance: alert, cooperative and no distress Heart: regular rate and rhythm Lungs: clear to auscultation bilaterally Abdomen: soft, non-tender; bowel sounds normal; no masses,  no organomegaly Wound: clean and dry  Lab Results: Recent Labs    11/09/19 1905 11/11/19 0429  WBC 5.5 9.1  HGB 12.7* 11.4*  HCT 37.5* 34.7*  PLT 245 266   BMET:  Recent Labs    11/09/19 1905 11/11/19 0429  NA 138 136  K 3.8 3.9  CL 105 104  CO2 24 21*  GLUCOSE 117* 125*  BUN <5* 9  CREATININE 0.98 0.99  CALCIUM 9.0 8.5*    PT/INR:  Recent Labs    11/09/19 1905  LABPROT 13.2  INR 1.0   ABG    Component Value Date/Time   PHART 7.442 11/09/2019 1856   HCO3 23.7 11/09/2019 1856   TCO2 35 (H) 12/21/2017 0409   O2SAT 97.0 11/09/2019 1856   CBG (last 3)  Recent Labs    11/10/19 2113 11/11/19 0628  GLUCAP 127* 120*    Assessment/Plan: S/P Procedure(s) (LRB): VIDEO BRONCHOSCOPY (N/A) VIDEO ASSISTED  THORACOSCOPY (Left) STAPLING OF BLEBS (Left)  1. CV- NSR, BP slightly elevated- on Norvasc currently, per medicine 2. Pulm- CXR w/o significant pneumothorax, no pleural effusions. CT output 100 ml since surgery, no air leak present, will place to water seal at midnight tonight 3. D/C Arterial Line 4. Pain control- on PCA, can use oral medications as well, will give 15 mg of Toradol for a few doses to see if cant get better pain control 5. Dispo- patient stable, CXR looks good, no significant pneumothorax present, no air leak present, will place chest tube to water seal at midnight, repeat CXR in AM... care per primary   LOS: 5 days   Ellwood Handler, PA-C  11/11/2019  Chest tube to water seal No PTX D/c a line monitor and treat hypertension  I have seen and examined Fredirick Lathe and agree with the above assessment  and plan.  Grace Isaac MD Beeper [phone] Office [phone] 11/11/2019 8:54 AM

## 2019-11-11 NOTE — Discharge Instructions (Signed)
Video-Assisted Thoracic Surgery, Care After °This sheet gives you information about how to care for yourself after your procedure. Your health care provider may also give you more specific instructions. If you have problems or questions, contact your health care provider. °What can I expect after the procedure? °After the procedure, it is common to have: °· Some pain and soreness in your chest. °· Pain when breathing in (inhaling) and coughing. °· Constipation. °· Fatigue. °· Difficulty sleeping. °Follow these instructions at home: °Preventing pneumonia °· Take deep breaths or do breathing exercises as instructed by your health care provider. Doing this helps prevent lung infection (pneumonia). °· Cough frequently. Coughing may cause discomfort, but it is important to clear mucus (phlegm) and expand your lungs. If it hurts to cough, hold a pillow against your chest or place the palms of both hands on top of the incision (use splinting) when you cough. This may help relieve discomfort. °· If you were given an incentive spirometer, use it as directed. An incentive spirometer is a tool that measures how well you are filling your lungs with each breath. °· Participate in pulmonary rehabilitation as directed by your health care provider. This is a program that combines education, exercise, and support from a team of specialists. The goal is to help you heal and get back to your normal activities as soon as possible. °Medicines °· Take over-the-counter or prescription medicines only as told by your health care provider. °· If you have pain, take pain-relieving medicine before your pain becomes severe. This is important because if your pain is under control, you will be able to breathe and cough more comfortably. °· If you were prescribed an antibiotic medicine, take it as told by your health care provider. Do not stop taking the antibiotic even if you start to feel better. °Activity °· Ask your health care provider what  activities are safe for you. °· Avoid activities that use your chest muscles for at least 3-4 weeks. °· Do not lift anything that is heavier than 10 lb (4.5 kg), or the limit that your health care provider tells you, until he or she says that it is safe. °Incision care °· Follow instructions from your health care provider about how to take care of your incision(s). Make sure you: °? Wash your hands with soap and water before you change your bandage (dressing). If soap and water are not available, use hand sanitizer. °? Change your dressing as told by your health care provider. °? Leave stitches (sutures), skin glue, or adhesive strips in place. These skin closures may need to stay in place for 2 weeks or longer. If adhesive strip edges start to loosen and curl up, you may trim the loose edges. Do not remove adhesive strips completely unless your health care provider tells you to do that. °· Keep your dressing dry until it has been removed. °· Check your incision area every day for signs of infection. Check for: °? Redness, swelling, or pain. °? Fluid or blood. °? Warmth. °? Pus or a bad smell. °Bathing °· Do not take baths, swim, or use a hot tub until your health care provider approves. You may take showers. °· After your dressing has been removed, use soap and water to gently wash your incision area. Do not use anything else to clean your incision(s) unless your health care provider tells you to do this. °Driving ° °· Do not drive until your health care provider approves. °· Do not drive or   use heavy machinery while taking prescription pain medicine. °Eating and drinking °· Eat a healthy, balanced diet as instructed by your health care provider. A healthy diet includes plenty of fresh fruits and vegetables, whole grains, and low-fat (lean) proteins. °· Limit foods that are high in fat and processed sugars, such as fried and sweet foods. °· Drink enough fluid to keep your urine clear or pale yellow. °General  instructions ° °· To prevent or treat constipation while you are taking prescription pain medicine, your health care provider may recommend that you: °? Take over-the-counter or prescription medicines. °? Eat foods that are high in fiber, such as beans, fresh fruits and vegetables, and whole grains. °· Do not use any products that contain nicotine or tobacco, such as cigarettes and e-cigarettes. If you need help quitting, ask your health care provider. °· Avoid secondhand smoke. °· Wear compression stockings as told by your health care provider. These stockings help to prevent blood clots and reduce swelling in your legs. °· If you have a chest tube, care for it as instructed by your health care provider. Do not travel by airplane during the 2 weeks after your chest tube is removed, or until your health care provider says that this is safe. °· Keep all follow-up visits as told by your health care provider. This is important. °Contact a health care provider if: °· You have redness, swelling, or pain around an incision. °· You have fluid or blood coming from an incision. °· Your incision area feels warm to the touch. °· You have pus or a bad smell coming from an incision. °· You have a fever or chills. °· You have nausea or vomiting. °· You have pain that does not get better with medicine. °Get help right away if: °· You have chest pain. °· Your heart is fluttering or beating rapidly. °· You develop a rash. °· You have shortness of breath or trouble breathing. °· You are confused. °· You have trouble speaking. °· You feel weak, light-headed, or dizzy. °· You faint. °Summary °· To help prevent lung infection (pneumonia), take deep breaths or do breathing exercises as instructed by your health care provider. °· Cough frequently to clear mucus (phlegm) and expand your lungs. If it hurts to cough, hold a pillow against your chest or place the palms of both hands on top of the incision (use splinting) when you cough. °· If  you have pain, take pain-relieving medicine before your pain becomes severe. This is important because if your pain is under control, you will be able to breathe and cough more comfortably. °· Ask your health care provider what activities are safe for you. °This information is not intended to replace advice given to you by your health care provider. Make sure you discuss any questions you have with your health care provider. °Document Released: 04/06/2013 Document Revised: 11/22/2017 Document Reviewed: 11/19/2016 °Elsevier Patient Education © 2020 [employer] ° °

## 2019-11-12 ENCOUNTER — Inpatient Hospital Stay (HOSPITAL_COMMUNITY): Payer: 59

## 2019-11-12 LAB — COMPREHENSIVE METABOLIC PANEL
ALT: 17 U/L (ref 0–44)
AST: 20 U/L (ref 15–41)
Albumin: 2.9 g/dL — ABNORMAL LOW (ref 3.5–5.0)
Alkaline Phosphatase: 38 U/L (ref 38–126)
Anion gap: 11 (ref 5–15)
BUN: 7 mg/dL (ref 6–20)
CO2: 23 mmol/L (ref 22–32)
Calcium: 8.7 mg/dL — ABNORMAL LOW (ref 8.9–10.3)
Chloride: 107 mmol/L (ref 98–111)
Creatinine, Ser: 1.01 mg/dL (ref 0.61–1.24)
GFR calc Af Amer: >60 mL/min (ref >60)
GFR calc non Af Amer: >60 mL/min (ref >60)
Glucose, Bld: 96 mg/dL (ref 70–99)
Potassium: 3.7 mmol/L (ref 3.5–5.1)
Sodium: 141 mmol/L (ref 135–145)
Total Bilirubin: 0.4 mg/dL (ref 0.3–1.2)
Total Protein: 5.5 g/dL — ABNORMAL LOW (ref 6.5–8.1)

## 2019-11-12 LAB — GLUCOSE, CAPILLARY
Glucose-Capillary: 86 mg/dL (ref 70–99)
Glucose-Capillary: 106 mg/dL — ABNORMAL HIGH (ref 70–99)

## 2019-11-12 LAB — CBC
HCT: 32.4 % — ABNORMAL LOW (ref 39.0–52.0)
Hemoglobin: 10.8 g/dL — ABNORMAL LOW (ref 13.0–17.0)
MCH: 32.7 pg (ref 26.0–34.0)
MCHC: 33.3 g/dL (ref 30.0–36.0)
MCV: 98.2 fL (ref 80.0–100.0)
Platelets: 251 10*3/uL (ref 150–400)
RBC: 3.3 MIL/uL — ABNORMAL LOW (ref 4.22–5.81)
RDW: 12.9 % (ref 11.5–15.5)
WBC: 6.1 10*3/uL (ref 4.0–10.5)
nRBC: 0 % (ref 0.0–0.2)

## 2019-11-12 LAB — MAGNESIUM: Magnesium: 1.9 mg/dL (ref 1.7–2.4)

## 2019-11-12 LAB — SURGICAL PATHOLOGY

## 2019-11-12 MED ORDER — AMLODIPINE BESYLATE 10 MG PO TABS: 10.0000 mg | ORAL_TABLET | Freq: Every day | ORAL | 0 refills | Status: AC

## 2019-11-12 MED ORDER — OXYCODONE HCL 5 MG PO TABS: 5.0000 mg | ORAL_TABLET | ORAL | 0 refills | Status: DC | PRN

## 2019-11-12 MED ORDER — ONDANSETRON HCL 4 MG PO TABS: 4.0000 mg | ORAL_TABLET | Freq: Every day | ORAL | 1 refills | Status: AC | PRN

## 2019-11-12 NOTE — Progress Notes (Signed)
2V CXR completed and reviewed.  The CXR appears stable with no definitive pneumothorax.  There is a small apical space from where apical bleb was resected.  The patient is medically stable for discharge home from surgical standpoint.  Care per primary.   Ellwood Handler, PA-C

## 2019-11-12 NOTE — Progress Notes (Signed)
Left-sided chest tube is removed. Occlusive dressing applied. PT denied any pain/ Vitals stable. Will continue to monitor pt.

## 2019-11-12 NOTE — Discharge Summary (Signed)
Physician Discharge Summary  Ray [name] ZOX:[id number] DOB: 07-May-1961 DOA: 11/05/2019  PCP: Mirna Mires, MD  Admit date: 11/05/2019 Discharge date: 11/12/2019  Admitted From: Home Disposition:  Home  Recommendations for Outpatient Follow-up:  1. Follow up with PCP in 1 week 2. Follow up with Dr. Drey Sage on 12/3   Discharge Condition: Stable CODE STATUS: Full  Diet recommendation: Heart healthy   Brief/Interim Summary: Ray [name] is a 58 year old African-American male with past medical history significant for hypertension, atelectasis and prior history of pneumothorax (right-sided). Patient was admitted with left shoulder and chest pain. Work-up done revealed moderate left-sided pneumothorax. Cardiothoracic surgery was consulted. Patient has left-sided chest tube to waterseal. Patient's lung has re-expanded, underwent VATS 11/17.   Discharge Diagnoses:  Principal Problem:   Pneumothorax Active Problems:   Hypertension   Hypokalemia   Left spontaneous pneumothorax: -Status post VATS -Per cardiothoracic surgery team -Chest tube removed. CXR stable without definitive pneumothorax.   Hypertension -Home regimen on admission was bisoprolol/HCTZ, Cozaar -Started on lisinopril but now discontinued due to patient's complaint of lip and tongue swelling -Continue Norvasc, bisoprolol-HCTZ, Cozaar  Hyperlipidemia -Continue Lipitor   GERD -Continue Protonix   Insomnia -Continue trazodone prn      Discharge Instructions  Discharge Instructions    Call MD for:  difficulty breathing, headache or visual disturbances   Complete by: As directed    Call MD for:  extreme fatigue   Complete by: As directed    Call MD for:  persistant dizziness or light-headedness   Complete by: As directed    Call MD for:  persistant nausea and vomiting   Complete by: As directed    Call MD for:  redness, tenderness, or signs of infection (pain, swelling, redness, odor or  green/yellow discharge around incision site)   Complete by: As directed    Call MD for:  severe uncontrolled pain   Complete by: As directed    Call MD for:  temperature >100.4   Complete by: As directed    Diet - low sodium heart healthy   Complete by: As directed    Discharge instructions   Complete by: As directed    You were cared for by a hospitalist during your hospital stay. If you have any questions about your discharge medications or the care you received while you were in the hospital after you are discharged, you can call the unit and ask to speak with the hospitalist on call if the hospitalist that took care of you is not available. Once you are discharged, your primary care physician will handle any further medical issues. Please note that NO REFILLS for any discharge medications will be authorized once you are discharged, as it is imperative that you return to your primary care physician (or establish a relationship with a primary care physician if you do not have one) for your aftercare needs so that they can reassess your need for medications and monitor your lab values.   Increase activity slowly   Complete by: As directed      Allergies as of 11/12/2019      Reactions   Asa [aspirin] Nausea Only   Lisinopril Swelling   Lip/Tongue swelling       Medication List    TAKE these medications   amLODipine 10 MG tablet Commonly known as: NORVASC Take 1 tablet (10 mg total) by mouth daily. Start taking on: November 13, 2019   atorvastatin 10 MG tablet Commonly known as: LIPITOR Take 10 mg  by mouth at bedtime.   bisoprolol-hydrochlorothiazide 10-6.25 MG tablet Commonly known as: Ziac Take 1 tablet by mouth daily.   losartan 100 MG tablet Commonly known as: COZAAR Take 100 mg by mouth every morning.   oxyCODONE 5 MG immediate release tablet Commonly known as: Oxy IR/ROXICODONE Take 1-2 tablets (5-10 mg total) by mouth every 4 (four) hours as needed for moderate  pain.   pantoprazole 40 MG tablet Commonly known as: PROTONIX TAKE 1 TABLET BY MOUTH EVERY DAY   traZODone 50 MG tablet Commonly known as: DESYREL Take 50 mg by mouth at bedtime as needed for sleep.      Follow-up Information    Delight OvensGerhardt, Edward B, MD Follow up on 11/26/2019.   Specialty: Cardiothoracic Surgery Why: Appointment is at 2:00, please get CXR at 1:30 at [facility] located on first floor of our office building Contact information: [address] [place] [address] [phone]        Mirna MiresHill, Gerald, MD. Schedule an appointment as soon as possible for a visit in 1 week(s).   Specialty: Family Medicine Contact information: [address] [place] [address] [phone]          Allergies  Allergen Reactions  . Asa [Aspirin] Nausea Only  . Lisinopril Swelling    Lip/Tongue swelling     Consultations:  Cardiothoracic surgery    Procedures/Studies: Dg Chest 2 View  Result Date: 11/12/2019 CLINICAL DATA:  Status post chest tube removal EXAM: CHEST - 2 VIEW COMPARISON:  11/12/2019, 6:17 a.m. FINDINGS: Interval removal of a left-sided chest tube. No significant pneumothorax. No acute abnormality of the lungs. The heart and mediastinum are unremarkable. IMPRESSION: Interval removal of a left-sided chest tube. No significant pneumothorax. No acute abnormality of the lungs. Electronically Signed   By: Lauralyn PrimesAlex  Bibbey M.D.   On: 11/12/2019 14:04   Dg Chest 2 View  Result Date: 11/06/2019 CLINICAL DATA:  Pneumothorax EXAM: CHEST - 2 VIEW COMPARISON:  November 05, 2019 FINDINGS: There is slight interval enlargement of the left-sided pneumothorax with slight rightward mediastinal shift. The right lung is clear. No acute osseous abnormality. IMPRESSION: Moderate left pneumothorax, slightly enlarged from the prior exam now with slight rightward mediastinal shift. These results were called by telephone at the time of interpretation on  11/06/2019 at 1:17 am to provider Gomez CleverlySherri Utsill PA, who verbally acknowledged these results. Electronically Signed   By: Jonna ClarkBindu  Avutu M.D.   On: 11/06/2019 01:28   Dg Chest 2 View  Result Date: 11/05/2019 CLINICAL DATA:  Pneumothorax follow-up. EXAM: CHEST - 2 VIEW COMPARISON:  Chest x-ray from same day at 6:35 p.m. FINDINGS: The heart size and mediastinal contours are within normal limits. Normal pulmonary vascularity. Unchanged small left pneumothorax. Postsurgical changes in the right lung apex again noted. No consolidation or pleural effusion. No acute osseous abnormality. IMPRESSION: 1. Unchanged small left pneumothorax. Electronically Signed   By: Obie DredgeWilliam T Derry M.D.   On: 11/05/2019 20:40   Dg Chest 2 View  Result Date: 11/05/2019 CLINICAL DATA:  Left shoulder blade pain. History of right-sided lung collapse. EXAM: CHEST - 2 VIEW COMPARISON:  September 11, 2018 FINDINGS: There is a left-sided pneumothorax extending from apex towards base measuring 11 mm laterally and 17 mm at the apex. No shift to suggest tension pneumothorax. Postsurgical changes are seen in the right apex. No right-sided pneumothorax. Cardiomediastinal silhouette is stable. The lungs are clear. IMPRESSION: New left pneumothorax measuring 17 mm at the  apex and 11 mm laterally with no evidence of tension. Findings called to the patient's PA, Terance Hart. Electronically Signed   By: Gerome Sam III M.D   On: 11/05/2019 18:47   Ct Chest Wo Contrast  Result Date: 11/07/2019 CLINICAL DATA:  Pneumothorax, recurrent pneumothorax on the left. EXAM: CT CHEST WITHOUT CONTRAST TECHNIQUE: Multidetector CT imaging of the chest was performed following the standard protocol without IV contrast. COMPARISON:  Multiple prior studies, most recent exam chest x-ray 11/07/2019 FINDINGS: Cardiovascular: Heart size is stable and normal no pericardial effusion. Vascular assessment limited by lack of intravenous contrast. Normal caliber thoracic  aorta and central pulmonary vasculature. No signs of calcified atherosclerotic change. Mediastinum/Nodes: No enlarged mediastinal or axillary lymph nodes. Thyroid gland, trachea, and esophagus demonstrate no significant findings. Lungs/Pleura: Signs of partial lung resection for removal of bleb in the right lung apex. Small blebs are seen in the area of the right lung apex. No signs of right-sided pneumothorax. Left-sided chest tube in situ. Tiny left apical pneumothorax adjacent bullous changes, the largest in the left upper lobe measuring 3.2 x 2.8 cm. Another measuring approximately 3 x 2.3 cm seen in the medial left lung apex. Paraseptal emphysematous changes are seen elsewhere. There is also a small anterior component of the pneumothorax tracking along the anterior left chest measuring approximately 5 mm in greatest thickness. Mild basilar atelectasis bilaterally. Upper Abdomen: No acute process in the upper abdomen. Musculoskeletal: No acute bone finding or destructive bone lesion. IMPRESSION: 1. Left-sided chest tube in situ with small residual pneumothorax seen at the lung apex and anteriorly. 2. Bullous changes at the left lung apex as described. 3. Postoperative changes in the right lung apex since the previous CT with signs of paraseptal emphysema elsewhere. 4. No other acute abnormalities. Emphysema (ICD10-J43.9). Electronically Signed   By: Donzetta Kohut M.D.   On: 11/07/2019 14:57   Dg Chest Port 1 View  Result Date: 11/12/2019 CLINICAL DATA:  58 year old male with pneumothorax and shortness of breath. Chest tube in place. EXAM: PORTABLE CHEST 1 VIEW COMPARISON:  Chest radiograph dated 11/11/2019. FINDINGS: Left-sided chest tube in similar position. There is a small left apical pneumothorax, new or more conspicuous since the yesterday's radiograph. Postsurgical changes of the right upper lobe. No new consolidative changes. There is no pleural effusion. The cardiac silhouette is within normal  limits. No acute osseous pathology. IMPRESSION: Small left apical pneumothorax, new or more conspicuous than yesterday's radiograph. Left-sided chest tube in similar position. Electronically Signed   By: Elgie Collard M.D.   On: 11/12/2019 08:45   Dg Chest Port 1 View  Result Date: 11/11/2019 CLINICAL DATA:  Follow-up chest tube placement EXAM: PORTABLE CHEST 1 VIEW COMPARISON:  11/10/2019 FINDINGS: Cardiac shadow is stable. Left thoracostomy catheter is again seen. The previously noted left pneumothorax is not as well appreciated on today's exam. Postsurgical changes are again noted on the left stable from the prior study. No focal infiltrate is seen. No acute bony abnormality is noted. IMPRESSION: Left chest tube in place without definitive pneumothorax. Electronically Signed   By: Alcide Clever M.D.   On: 11/11/2019 08:44   Dg Chest Port 1 View  Result Date: 11/10/2019 CLINICAL DATA:  Chest tube removal EXAM: PORTABLE CHEST 1 VIEW COMPARISON:  11/08/2019, 11/07/2019, 11/06/2019 FINDINGS: Removal of left-sided pigtail drainage catheter. Interim insertion of straight tip chest tube with tip at the medial left apex. Suspected tiny apical pneumothorax. New postsurgical changes in the medial left apex. Postsurgical  changes at the right apex. Stable heart size. No acute airspace disease. Small amount of soft tissue gas in the left lower chest wall laterally IMPRESSION: 1. Removal of left pigtail chest drainage catheter. New postsurgical changes in the left suprahilar lung with placement of left chest tube with tip at the medial left apex. Suspected tiny left apical pneumothorax. Electronically Signed   By: Jasmine Pang M.D.   On: 11/10/2019 18:07   Dg Chest Port 1 View  Result Date: 11/08/2019 CLINICAL DATA:  Pneumothorax follow-up EXAM: PORTABLE CHEST 1 VIEW COMPARISON:  11/08/2019 FINDINGS: Left-sided chest tube remains in place. The moderate pneumothorax exhibited on the previous study has  resolved. There is no current visible pneumothorax. Lungs are clear with postoperative changes in the right lung apex as before. No acute bone finding. IMPRESSION: The left-sided pneumothorax has resolved. The left chest tube remains in place. No other interval changes. Electronically Signed   By: Donzetta Kohut M.D.   On: 11/08/2019 16:16   Dg Chest Port 1 View  Result Date: 11/08/2019 CLINICAL DATA:  Pneumothorax. EXAM: PORTABLE CHEST 1 VIEW COMPARISON:  Chest x-ray of 11/07/2019 FINDINGS: Moderate left pneumothorax left-sided chest tube remains in place. Mild mediastinal shift. Cardiomediastinal contours are stable. Signs of postoperative change in the right lung apex as before. No acute bone finding. IMPRESSION: Enlargement of left-sided pneumothorax since chest CT acquired on 11/07/2019. Subsequent radiograph shows resolution of pneumothorax. Left-sided chest tube remains in place. Electronically Signed   By: Donzetta Kohut M.D.   On: 11/08/2019 16:15   Dg Chest Port 1 View In Am  Result Date: 11/07/2019 CLINICAL DATA:  Chest tube for pneumothorax EXAM: PORTABLE CHEST 1 VIEW COMPARISON:  11/06/2019 FINDINGS: Pigtail chest tube on the left is unchanged.  No pneumothorax. Lungs are clear without infiltrate or effusion. Surgical staples in the right lung apex. IMPRESSION: Left chest tube remains in place. No pneumothorax. Lungs are clear. Electronically Signed   By: Marlan Palau M.D.   On: 11/07/2019 10:52   Dg Chest Portable 1 View  Result Date: 11/06/2019 CLINICAL DATA:  Pneumothorax. Status post chest tube placement. EXAM: PORTABLE CHEST 1 VIEW COMPARISON:  One-view chest x-ray 11/06/2019 FINDINGS: Heart size is normal. Left-sided chest tube is in place. The left lung is re-expanded. A tiny residual pneumothorax is visible at the apex. The right lung is clear. Degenerative changes are noted at the shoulders. IMPRESSION: 1. Tiny residual pneumothorax at the apex. 2. Left-sided chest tube in  place. 3. The right lung is clear. Electronically Signed   By: Marin Roberts M.D.   On: 11/06/2019 05:11       Discharge Exam: Vitals:   11/12/19 0900 11/12/19 1124  BP:  (!) 158/97  Pulse:  93  Resp: 19 16  Temp:  99.8 F (37.7 C)  SpO2:  100%    General: Pt is alert, awake, not in acute distress Cardiovascular: RRR, S1/S2 +, no edema Respiratory: CTA bilaterally, no wheezing, no rhonchi, no respiratory distress, no conversational dyspnea  Abdominal: Soft, NT, ND, bowel sounds + Extremities: no edema, no cyanosis Psych: Normal mood and affect, stable judgement and insight     The results of significant diagnostics from this hospitalization (including imaging, microbiology, ancillary and laboratory) are listed below for reference.     Microbiology: Recent Results (from the past 240 hour(s))  SARS CORONAVIRUS 2 (TAT 6-24 HRS) Nasopharyngeal Nasopharyngeal Swab     Status: None   Collection Time: 11/06/19  2:14 AM  Specimen: Nasopharyngeal Swab  Result Value Ref Range Status   SARS Coronavirus 2 NEGATIVE NEGATIVE Final    Comment: (NOTE) SARS-CoV-2 target nucleic acids are NOT DETECTED. The SARS-CoV-2 RNA is generally detectable in upper and lower respiratory specimens during the acute phase of infection. Negative results do not preclude SARS-CoV-2 infection, do not rule out co-infections with other pathogens, and should not be used as the sole basis for treatment or other patient management decisions. Negative results must be combined with clinical observations, patient history, and epidemiological information. The expected result is Negative. Fact Sheet for Patients: HairSlick.no Fact Sheet for Healthcare Providers: quierodirigir.com This test is not yet approved or cleared by the [place] FDA and  has been authorized for detection and/or diagnosis of SARS-CoV-2 by FDA under an Emergency Use  Authorization (EUA). This EUA will remain  in effect (meaning this test can be used) for the duration of the COVID-19 declaration under Section 56 4(b)(1) of the Act, 21 U.S.C. section 360bbb-3(b)(1), unless the authorization is terminated or revoked sooner. Performed at [facility], [address], [place], [place] [address]   MRSA PCR Screening     Status: None   Collection Time: 11/10/19  7:41 AM   Specimen: Nasal Mucosa; Nasopharyngeal  Result Value Ref Range Status   MRSA by PCR NEGATIVE NEGATIVE Final    Comment:        The GeneXpert MRSA Assay (FDA approved for NASAL specimens only), is one component of a comprehensive MRSA colonization surveillance program. It is not intended to diagnose MRSA infection nor to guide or monitor treatment for MRSA infections. Performed at [facility], [address] [place], [place] [address]      Labs: BNP (last 3 results) No results for input(s): BNP in the last 8760 hours. Basic Metabolic Panel: Recent Labs  Lab 11/07/19 0947 11/08/19 0445 11/09/19 0346 11/09/19 1905 11/11/19 0429 11/12/19 0222  NA 138 139 138 138 136 141  K 3.0* 3.6 3.6 3.8 3.9 3.7  CL 103 103 104 105 104 107  CO2 21* 23  GLUCOSE 142* 114* 103* 117* 125* 96  BUN 5* <5* <5* <5* 9 7  CREATININE 1.00 0.96 0.99 0.98 0.99 1.01  CALCIUM 8.4* 8.8* 8.8* 9.0 8.5* 8.7*  MG 1.5* 1.9 1.7  --  1.6* 1.9  PHOS 1.6* 1.7* 2.1*  --   --   --    Liver Function Tests: Recent Labs  Lab 11/07/19 0947 11/08/19 0445 11/09/19 0346 11/09/19 1905 11/12/19 0222  AST  --   --   --  38 20  ALT  --   --   --  34 17  ALKPHOS  --   --   --  54 38  BILITOT  --   --   --  0.4 0.4  PROT  --   --   --  7.0 5.5*  ALBUMIN 3.2* 3.1* 3.0* 3.6 2.9*   No results for input(s): LIPASE, AMYLASE in the last 168 hours. No results for input(s): AMMONIA in the last 168 hours. CBC: Recent Labs  Lab 11/06/19 0151 11/09/19 1905 11/11/19 0429 11/12/19 0222   WBC 8.2 5.5 9.1 6.1  NEUTROABS 6.1  --   --   --   HGB 12.4* 12.7* 11.4* 10.8*  HCT 37.9* 37.5* 34.7* 32.4*  MCV 99.2 97.9 98.6 98.2  PLT 305 245 266 251   Cardiac Enzymes: No results for input(s): CKTOTAL, CKMB, CKMBINDEX,  TROPONINI in the last 168 hours. BNP: Invalid input(s): POCBNP CBG: Recent Labs  Lab 11/11/19 0628 11/11/19 0829 11/11/19 1756 11/11/19 2317 11/12/19 0642  GLUCAP 120* 92 102* 86 106*   D-Dimer No results for input(s): DDIMER in the last 72 hours. Hgb A1c No results for input(s): HGBA1C in the last 72 hours. Lipid Profile No results for input(s): CHOL, HDL, LDLCALC, TRIG, CHOLHDL, LDLDIRECT in the last 72 hours. Thyroid function studies No results for input(s): TSH, T4TOTAL, T3FREE, THYROIDAB in the last 72 hours.  Invalid input(s): FREET3 Anemia work up No results for input(s): VITAMINB12, FOLATE, FERRITIN, TIBC, IRON, RETICCTPCT in the last 72 hours. Urinalysis    Component Value Date/Time   COLORURINE YELLOW 11/10/2019 0121   APPEARANCEUR CLEAR 11/10/2019 0121   LABSPEC 1.016 11/10/2019 0121   PHURINE 6.0 11/10/2019 0121   GLUCOSEU 50 (A) 11/10/2019 0121   HGBUR NEGATIVE 11/10/2019 0121   BILIRUBINUR NEGATIVE 11/10/2019 0121   KETONESUR NEGATIVE 11/10/2019 0121   PROTEINUR NEGATIVE 11/10/2019 0121   UROBILINOGEN 0.2 12/23/2013 1646   NITRITE NEGATIVE 11/10/2019 0121   LEUKOCYTESUR NEGATIVE 11/10/2019 0121   Sepsis Labs Invalid input(s): PROCALCITONIN,  WBC,  LACTICIDVEN Microbiology Recent Results (from the past 240 hour(s))  SARS CORONAVIRUS 2 (TAT 6-24 HRS) Nasopharyngeal Nasopharyngeal Swab     Status: None   Collection Time: 11/06/19  2:14 AM   Specimen: Nasopharyngeal Swab  Result Value Ref Range Status   SARS Coronavirus 2 NEGATIVE NEGATIVE Final    Comment: (NOTE) SARS-CoV-2 target nucleic acids are NOT DETECTED. The SARS-CoV-2 RNA is generally detectable in upper and lower respiratory specimens during the acute phase of  infection. Negative results do not preclude SARS-CoV-2 infection, do not rule out co-infections with other pathogens, and should not be used as the sole basis for treatment or other patient management decisions. Negative results must be combined with clinical observations, patient history, and epidemiological information. The expected result is Negative. Fact Sheet for Patients: SugarRoll.be Fact Sheet for Healthcare Providers: https://www.woods-mathews.com/ This test is not yet approved or cleared by the [place] FDA and  has been authorized for detection and/or diagnosis of SARS-CoV-2 by FDA under an Emergency Use Authorization (EUA). This EUA will remain  in effect (meaning this test can be used) for the duration of the COVID-19 declaration under Section 56 4(b)(1) of the Act, 21 U.S.C. section 360bbb-3(b)(1), unless the authorization is terminated or revoked sooner. Performed at [facility], [place] [address], [place], [place] [address]   MRSA PCR Screening     Status: None   Collection Time: 11/10/19  7:41 AM   Specimen: Nasal Mucosa; Nasopharyngeal  Result Value Ref Range Status   MRSA by PCR NEGATIVE NEGATIVE Final    Comment:        The GeneXpert MRSA Assay (FDA approved for NASAL specimens only), is one component of a comprehensive MRSA colonization surveillance program. It is not intended to diagnose MRSA infection nor to guide or monitor treatment for MRSA infections. Performed at [facility], [place] [address] [place], [place] [address]      Patient was seen and examined on the day of discharge and was found to be in stable condition. Time coordinating discharge: 25 minutes including assessment and coordination of care, as well as examination of the patient.   SIGNED:  Dessa Phi, DO [facility] 11/12/2019, 2:10 PM

## 2019-11-12 NOTE — Progress Notes (Addendum)
      [address]       [place] [address]             [phone]      2 Days Post-Op Procedure(s) (LRB): VIDEO BRONCHOSCOPY (N/A) VIDEO ASSISTED THORACOSCOPY (Left) STAPLING OF BLEBS (Left)   Subjective:  Up in bed without complaints. States he is ready to go home.    Objective: Vital signs in last 24 hours: Temp:  [98.5 F (36.9 C)-98.9 F (37.2 C)] 98.9 F (37.2 C) (11/19 0303) Pulse Rate:  [71-88] 71 (11/19 0303) Cardiac Rhythm: Normal sinus rhythm (11/19 0701) Resp:  [12-20] 16 (11/19 0456) BP: (117-159)/(66-84) 117/71 (11/19 0303) SpO2:  [33 %-100 %] 33 % (11/19 0456) Weight:  [66.7 kg] 66.7 kg (11/19 0458)  Intake/Output from previous day: 11/18 0701 - 11/19 0700 In: 1622.5 [P.O.:960; I.V.:512.4; IV Piggyback:150.1] Out: 0932 [Urine:1690; Chest Tube:130]  General appearance: alert, cooperative and no distress Heart: regular rate and rhythm Lungs: clear to auscultation bilaterally Abdomen: soft, non-tender; bowel sounds normal; no masses,  no organomegaly Wound: clean and dry  Lab Results: Recent Labs    11/11/19 0429 11/12/19 0222  WBC 9.1 6.1  HGB 11.4* 10.8*  HCT 34.7* 32.4*  PLT 266 251   BMET:  Recent Labs    11/11/19 0429 11/12/19 0222  NA 136 141  K 3.9 3.7  CL 104 107  CO2 21* 23  GLUCOSE 125* 96  BUN 9 7  CREATININE 0.99 1.01  CALCIUM 8.5* 8.7*    PT/INR:  Recent Labs    11/09/19 1905  LABPROT 13.2  INR 1.0   ABG    Component Value Date/Time   PHART 7.442 11/09/2019 1856   HCO3 23.7 11/09/2019 1856   TCO2 35 (H) 12/21/2017 0409   O2SAT 97.0 11/09/2019 1856   CBG (last 3)  Recent Labs    11/11/19 1756 11/11/19 2317 11/12/19 0642  GLUCAP 102* 86 106*    Assessment/Plan: S/P Procedure(s) (LRB): VIDEO BRONCHOSCOPY (N/A) VIDEO ASSISTED THORACOSCOPY (Left) STAPLING OF BLEBS (Left)  1. CV- NSR, + HTN- per medicine 2. Pulm- CT is on water seal, no air leak is present, will d/c chest tube today  3. Dispo- patient stable, d/c chest tube today, will get repeat CXR at noon, is stable patient can be discharged home if okay with medicine.. I have sent a narcotic prescription to the pharmacy for post operative pain management.   LOS: 6 days    Ellwood Handler, PA-C 11/12/2019  No air leak, chest xray ok D/c chest tube today poss home this afternoon Needs better home bp control and monitoring Path reviewed , benign blebs  I have seen and examined Ray [name] and agree with the above assessment  and plan.  Grace Isaac MD Beeper [phone] Office [phone] 11/12/2019 9:13 AM

## 2019-11-25 ENCOUNTER — Other Ambulatory Visit: Payer: Self-pay | Admitting: Cardiothoracic Surgery

## 2019-11-25 DIAGNOSIS — J939 Pneumothorax, unspecified: Secondary | ICD-10-CM

## 2019-11-25 NOTE — Progress Notes (Signed)
[address]       [place] [address]             [phone]      Zeric Ponds [facility] Medical Record #[id number] Date of Birth: 1961-04-23  Referring: Recardo Evangelist, PA-C Primary Care: Iona Beard, MD Primary Cardiologist: No primary care provider on file.   Chief Complaint:   POST OP FOLLOW UP OPERATIVE REPORT DATE OF PROCEDURE:  11/10/2019 PREOPERATIVE DIAGNOSIS:  Spontaneous left pneumothorax with multiple apical bullae. POSTOPERATIVE DIAGNOSIS:  Spontaneous left pneumothorax with multiple apical bullae. SURGICAL PROCEDURE:  Bronchoscopy, left video-assisted thoracoscopy, stapling of apical blebs, mechanical pleurodesis, intercostal nerve block with Exparel.  History of Present Illness:      Patient doing well following repair of spontaneous left pneumothorax with multiple blebs.  Patient denies shortness of breath notes he does get fatigued    Past Medical History:  Diagnosis Date  . Atelectasis    r/side  . Hypertension      Social History   Tobacco Use  Smoking Status Former Smoker  Smokeless Tobacco Never Used  Tobacco Comment   quit 20 years ago    Social History   Substance and Sexual Activity  Alcohol Use Yes   Comment: pt reports 1 beer daily     Allergies  Allergen Reactions  . Asa [Aspirin] Nausea Only  . Lisinopril Swelling    Lip/Tongue swelling     Current Outpatient Medications  Medication Sig Dispense Refill  . amLODipine (NORVASC) 10 MG tablet Take 1 tablet (10 mg total) by mouth daily. 30 tablet 0  . atorvastatin (LIPITOR) 10 MG tablet Take 10 mg by mouth at bedtime.    . bisoprolol-hydrochlorothiazide (ZIAC) 10-6.25 MG per tablet Take 1 tablet by mouth daily. 30 tablet 0  . losartan (COZAAR) 100 MG tablet Take 100 mg by mouth every morning.  1  . ondansetron (ZOFRAN) 4 MG tablet Take 1 tablet (4 mg total) by mouth daily as needed for nausea or vomiting. 30 tablet 1  . oxyCODONE (OXY  IR/ROXICODONE) 5 MG immediate release tablet Take 1-2 tablets (5-10 mg total) by mouth every 4 (four) hours as needed for moderate pain. 30 tablet 0  . pantoprazole (PROTONIX) 40 MG tablet TAKE 1 TABLET BY MOUTH EVERY DAY (Patient taking differently: Take 40 mg by mouth daily. ) 30 tablet 1  . traZODone (DESYREL) 50 MG tablet Take 50 mg by mouth at bedtime as needed for sleep.   4   No current facility-administered medications for this visit.        Physical Exam: BP 118/71 (BP Location: Right Arm, Patient Position: Sitting, Cuff Size: Normal)   Pulse 71   Temp 97.9 F (36.6 C) (Skin)   Resp 20   Ht 6' (1.829 m)   Wt 68 kg   SpO2 96% Comment: RA  BMI 20.34 kg/m   General appearance: alert, cooperative and no distress Neurologic: intact Heart: regular rate and rhythm, S1, S2 normal, no murmur, click, rub or gallop Lungs: clear to auscultation bilaterally Abdomen: soft, non-tender; bowel sounds normal; no masses,  no organomegaly Extremities: extremities normal, atraumatic, no cyanosis or edema and Homans sign is negative, no sign of DVT Wound: Chest tube sutures were removed incision sites are healing well   Diagnostic Studies & Laboratory data:     Recent Radiology Findings:   Dg Chest 2 View  Result Date: 11/26/2019 CLINICAL DATA:  Pneumothorax. EXAM: CHEST - 2  VIEW COMPARISON:  11/12/2019. FINDINGS: Mediastinum and hilar structures normal. Surgical sutures right upper lung. No focal infiltrate. No pleural effusion or pneumothorax. Degenerative change thoracic spine. IMPRESSION: 1.  Surgical sutures right upper lung. 2.  No acute cardiopulmonary disease. Electronically Signed   By: Maisie Fus  Register   On: 11/26/2019 14:12    I have independently reviewed the above radiology studies  and reviewed the findings with the patient.    Recent Lab Findings: Lab Results  Component Value Date   WBC 6.1 11/12/2019   HGB 10.8 (L) 11/12/2019   HCT 32.4 (L) 11/12/2019   PLT 251  11/12/2019   GLUCOSE 96 11/12/2019   ALT 17 11/12/2019   AST 20 11/12/2019   NA 141 11/12/2019   K 3.7 11/12/2019   CL 107 11/12/2019   CREATININE 1.01 11/12/2019   BUN 7 11/12/2019   CO2 23 11/12/2019   INR 1.0 11/09/2019      Assessment / Plan:   Patient doing well following left video-assisted thoracoscopy with stapling of apical blebs Patient is no longer smoking Patient does heavy lifting at work-we will sign a lot of heavy lifting for 6 weeks Plan to see him back with follow-up chest x-ray 5 to 6 weeks   Medication Changes: No orders of the defined types were placed in this encounter.     Delight Ovens MD      [address] [place] [place] [address] Office [phone]     11/26/2019 3:20 PM

## 2019-11-26 ENCOUNTER — Ambulatory Visit (INDEPENDENT_AMBULATORY_CARE_PROVIDER_SITE_OTHER): Payer: Self-pay | Admitting: Cardiothoracic Surgery

## 2019-11-26 ENCOUNTER — Other Ambulatory Visit: Payer: Self-pay

## 2019-11-26 ENCOUNTER — Ambulatory Visit
Admission: RE | Admit: 2019-11-26 | Discharge: 2019-11-26 | Disposition: A | Discharge status: Home / Self Care | Payer: 59 | Source: Ambulatory Visit | Attending: Cardiothoracic Surgery | Admitting: Cardiothoracic Surgery

## 2019-11-26 VITALS — BP 118/71 | HR 71 | Temp 97.9°F | Resp 20 | Ht 72.0 in | Wt 150.0 lb

## 2019-11-26 DIAGNOSIS — Z09 Encounter for follow-up examination after completed treatment for conditions other than malignant neoplasm: Secondary | ICD-10-CM

## 2019-11-26 DIAGNOSIS — Z736 Limitation of activities due to disability: Secondary | ICD-10-CM

## 2019-11-26 DIAGNOSIS — J939 Pneumothorax, unspecified: Secondary | ICD-10-CM

## 2019-11-26 NOTE — Progress Notes (Signed)
Pt has two sutures to L upper back. Incision sites are well approximated w/o s/s of infection. Removed sutures per standard protocol; pt tolerates well. Advised to keep clean (w/ mild soap and water) and dry.

## 2019-12-30 ENCOUNTER — Other Ambulatory Visit: Payer: Self-pay | Admitting: Cardiothoracic Surgery

## 2019-12-30 DIAGNOSIS — J439 Emphysema, unspecified: Secondary | ICD-10-CM

## 2019-12-31 ENCOUNTER — Other Ambulatory Visit: Payer: Self-pay

## 2019-12-31 ENCOUNTER — Ambulatory Visit (INDEPENDENT_AMBULATORY_CARE_PROVIDER_SITE_OTHER): Payer: Self-pay | Admitting: Cardiothoracic Surgery

## 2019-12-31 ENCOUNTER — Ambulatory Visit
Admission: RE | Admit: 2019-12-31 | Discharge: 2019-12-31 | Disposition: A | Discharge status: Home / Self Care | Payer: 59 | Source: Ambulatory Visit | Attending: Cardiothoracic Surgery | Admitting: Cardiothoracic Surgery

## 2019-12-31 VITALS — BP 117/76 | HR 82 | Temp 98.1°F | Resp 20 | Ht 72.0 in | Wt 150.5 lb

## 2019-12-31 DIAGNOSIS — J9383 Other pneumothorax: Secondary | ICD-10-CM

## 2019-12-31 DIAGNOSIS — J439 Emphysema, unspecified: Secondary | ICD-10-CM

## 2019-12-31 NOTE — Progress Notes (Signed)
[address]       [place] [address]             [phone]      Ray [name] [facility] Medical Record #[id number] Date of Birth: Dec 06, 1961  Referring: Bethel Born, PA-C Primary Care: Mirna Mires, MD Primary Cardiologist: No primary care provider on file.   Chief Complaint:   POST OP FOLLOW UP OPERATIVE REPORT DATE OF PROCEDURE:  11/10/2019 PREOPERATIVE DIAGNOSIS:  Spontaneous left pneumothorax with multiple apical bullae. POSTOPERATIVE DIAGNOSIS:  Spontaneous left pneumothorax with multiple apical bullae. SURGICAL PROCEDURE:  Bronchoscopy, left video-assisted thoracoscopy, stapling of apical blebs, mechanical pleurodesis, intercostal nerve block with Exparel.  History of Present Illness:      Patient doing well following repair of spontaneous left pneumothorax with multiple blebs.  Patient denies shortness of breath notes he does get fatigued.  Patient complains of some dysesthesias over the left lateral chest    Past Medical History:  Diagnosis Date  . Atelectasis    r/side  . Hypertension      Social History   Tobacco Use  Smoking Status Former Smoker  Smokeless Tobacco Never Used  Tobacco Comment   quit 20 years ago    Social History   Substance and Sexual Activity  Alcohol Use Yes   Comment: pt reports 1 beer daily     Allergies  Allergen Reactions  . Asa [Aspirin] Nausea Only  . Lisinopril Swelling    Lip/Tongue swelling     Current Outpatient Medications  Medication Sig Dispense Refill  . amLODipine (NORVASC) 10 MG tablet Take 1 tablet (10 mg total) by mouth daily. 30 tablet 0  . atorvastatin (LIPITOR) 10 MG tablet Take 10 mg by mouth at bedtime.    . bisoprolol-hydrochlorothiazide (ZIAC) 10-6.25 MG per tablet Take 1 tablet by mouth daily. 30 tablet 0  . losartan (COZAAR) 100 MG tablet Take 100 mg by mouth every morning.  1  . ondansetron (ZOFRAN) 4 MG tablet Take 1 tablet (4 mg total) by mouth daily as  needed for nausea or vomiting. 30 tablet 1  . pantoprazole (PROTONIX) 40 MG tablet TAKE 1 TABLET BY MOUTH EVERY DAY (Patient taking differently: Take 40 mg by mouth daily. ) 30 tablet 1  . traZODone (DESYREL) 50 MG tablet Take 50 mg by mouth at bedtime as needed for sleep.   4  . oxyCODONE (OXY IR/ROXICODONE) 5 MG immediate release tablet Take 1-2 tablets (5-10 mg total) by mouth every 4 (four) hours as needed for moderate pain. (Patient not taking: Reported on 12/31/2019) 30 tablet 0   No current facility-administered medications for this visit.       Physical Exam: BP 117/76 (BP Location: Right Arm, Patient Position: Sitting, Cuff Size: Normal)   Pulse 82   Temp 98.1 F (36.7 C) (Skin)   Resp 20   Ht 6' (1.829 m)   Wt 68.3 kg   SpO2 98% Comment: RA  BMI 20.41 kg/m   General appearance: alert, cooperative and no distress Neck: no adenopathy, no carotid bruit, no JVD, supple, symmetrical, trachea midline and thyroid not enlarged, symmetric, no tenderness/mass/nodules Lymph nodes: Cervical, supraclavicular, and axillary nodes normal. Resp: clear to auscultation bilaterally Cardio: regular rate and rhythm, S1, S2 normal, no murmur, click, rub or gallop GI: soft, non-tender; bowel sounds normal; no masses,  no organomegaly Extremities: extremities normal, atraumatic, no cyanosis or edema and Homans sign is negative, no sign of DVT Neurologic: Grossly  normal Diagnostic Studies & Laboratory data:     Recent Radiology Findings:   DG Chest 2 View  Result Date: 12/31/2019 CLINICAL DATA:  Shortness of breath EXAM: CHEST - 2 VIEW COMPARISON:  Chest radiograph November 26, 2019; chest CT November 07, 2019 FINDINGS: Postoperative changes noted in the medial right apex. The bullous disease seen in the apices on prior CT are not well delineated by radiography. There is no edema or consolidation. No pneumothorax evident. Heart size and pulmonary vascularity are normal. No adenopathy. No bone  lesions. IMPRESSION: Postoperative change medial right apex. Known apical bullae not appreciable by radiography. No pneumothorax. No edema or consolidation. Cardiac silhouette normal. Electronically Signed   By: Lowella Grip III M.D.   On: 12/31/2019 14:12    I have independently reviewed the above radiology studies  and reviewed the findings with the patient.    Recent Lab Findings: Lab Results  Component Value Date   WBC 6.1 11/12/2019   HGB 10.8 (L) 11/12/2019   HCT 32.4 (L) 11/12/2019   PLT 251 11/12/2019   GLUCOSE 96 11/12/2019   ALT 17 11/12/2019   AST 20 11/12/2019   NA 141 11/12/2019   K 3.7 11/12/2019   CL 107 11/12/2019   CREATININE 1.01 11/12/2019   BUN 7 11/12/2019   CO2 23 11/12/2019   INR 1.0 11/09/2019      Assessment / Plan:   Patient stable following left video-assisted thoracoscopy for pneumothorax. Patient is progressing well postoperatively. Since his work does involve lifting we will wait to return to work January 18 without restrictions. Plan see me back in 2 to 3 months with follow-up chest x-ray  Medication Changes: No orders of the defined types were placed in this encounter.     Grace Isaac MD      [address] [place] [address] Office [phone]     12/31/2019 3:18 PM

## 2020-01-14 ENCOUNTER — Other Ambulatory Visit: Payer: Self-pay | Admitting: Gastroenterology

## 2020-02-15 ENCOUNTER — Ambulatory Visit: Payer: 59 | Admitting: Gastroenterology

## 2020-02-16 ENCOUNTER — Other Ambulatory Visit: Payer: Self-pay

## 2020-02-16 ENCOUNTER — Ambulatory Visit
Admission: RE | Admit: 2020-02-16 | Discharge: 2020-02-16 | Disposition: A | Discharge status: Home / Self Care | Payer: 59 | Source: Ambulatory Visit | Attending: Family Medicine | Admitting: Family Medicine

## 2020-02-16 ENCOUNTER — Other Ambulatory Visit: Payer: Self-pay | Admitting: Family Medicine

## 2020-02-16 DIAGNOSIS — M25512 Pain in left shoulder: Secondary | ICD-10-CM

## 2020-03-02 ENCOUNTER — Other Ambulatory Visit: Payer: Self-pay

## 2020-03-02 ENCOUNTER — Other Ambulatory Visit (INDEPENDENT_AMBULATORY_CARE_PROVIDER_SITE_OTHER): Payer: 59

## 2020-03-02 ENCOUNTER — Ambulatory Visit: Payer: 59 | Admitting: Gastroenterology

## 2020-03-02 ENCOUNTER — Other Ambulatory Visit: Payer: Self-pay | Admitting: Cardiothoracic Surgery

## 2020-03-02 ENCOUNTER — Encounter: Payer: Self-pay | Admitting: Gastroenterology

## 2020-03-02 VITALS — BP 118/70 | HR 79 | Temp 97.3°F | Ht 72.0 in | Wt 152.5 lb

## 2020-03-02 DIAGNOSIS — J439 Emphysema, unspecified: Secondary | ICD-10-CM

## 2020-03-02 DIAGNOSIS — R1011 Right upper quadrant pain: Secondary | ICD-10-CM

## 2020-03-02 DIAGNOSIS — R945 Abnormal results of liver function studies: Secondary | ICD-10-CM

## 2020-03-02 DIAGNOSIS — R7989 Other specified abnormal findings of blood chemistry: Secondary | ICD-10-CM

## 2020-03-02 LAB — COMPREHENSIVE METABOLIC PANEL
ALT: 35 U/L (ref 0–53)
AST: 51 U/L — ABNORMAL HIGH (ref 0–37)
Albumin: 3.9 g/dL (ref 3.5–5.2)
Alkaline Phosphatase: 57 U/L (ref 39–117)
BUN: 8 mg/dL (ref 6–23)
CO2: 29 mEq/L (ref 19–32)
Calcium: 8.7 mg/dL (ref 8.4–10.5)
Chloride: 106 mEq/L (ref 96–112)
Creatinine, Ser: 1.12 mg/dL (ref 0.40–1.50)
GFR: 81.89 mL/min (ref >60.00)
Glucose, Bld: 94 mg/dL (ref 70–99)
Potassium: 2.9 mEq/L — ABNORMAL LOW (ref 3.5–5.1)
Sodium: 143 mEq/L (ref 135–145)
Total Bilirubin: 0.3 mg/dL (ref 0.2–1.2)
Total Protein: 7 g/dL (ref 6.0–8.3)

## 2020-03-02 LAB — CBC WITH DIFFERENTIAL/PLATELET
Basophils Absolute: 0 10*3/uL (ref 0.0–0.1)
Basophils Relative: 1.3 % (ref 0.0–3.0)
Eosinophils Absolute: 0 10*3/uL (ref 0.0–0.7)
Eosinophils Relative: 1.1 % (ref 0.0–5.0)
HCT: 35.7 % — ABNORMAL LOW (ref 39.0–52.0)
Hemoglobin: 12.1 g/dL — ABNORMAL LOW (ref 13.0–17.0)
Lymphocytes Relative: 35.3 % (ref 12.0–46.0)
Lymphs Abs: 1.2 10*3/uL (ref 0.7–4.0)
MCHC: 33.9 g/dL (ref 30.0–36.0)
MCV: 99.2 fl (ref 78.0–100.0)
Monocytes Absolute: 0.5 10*3/uL (ref 0.1–1.0)
Monocytes Relative: 14.8 % — ABNORMAL HIGH (ref 3.0–12.0)
Neutro Abs: 1.6 10*3/uL (ref 1.4–7.7)
Neutrophils Relative %: 47.5 % (ref 43.0–77.0)
Platelets: 263 10*3/uL (ref 150.0–400.0)
RBC: 3.6 Mil/uL — ABNORMAL LOW (ref 4.22–5.81)
RDW: 13.9 % (ref 11.5–15.5)
WBC: 3.4 10*3/uL — ABNORMAL LOW (ref 4.0–10.5)

## 2020-03-02 LAB — LIPASE: Lipase: 60 U/L — ABNORMAL HIGH (ref 11.0–59.0)

## 2020-03-02 MED ORDER — ONDANSETRON 4 MG PO TBDP: 4.0000 mg | ORAL_TABLET | Freq: Three times a day (TID) | ORAL | 0 refills | Status: DC | PRN

## 2020-03-02 MED ORDER — PANTOPRAZOLE SODIUM 40 MG PO TBEC: 40.0000 mg | DELAYED_RELEASE_TABLET | Freq: Every day | ORAL | 11 refills | Status: DC

## 2020-03-02 NOTE — Patient Instructions (Signed)
If you are age 57 or older, your body mass index should be between 23-30. Your Body mass index is 20.68 kg/m. If this is out of the aforementioned range listed, please consider follow up with your Primary Care Provider.  If you are age 37 or younger, your body mass index should be between 19-25. Your Body mass index is 20.68 kg/m. If this is out of the aformentioned range listed, please consider follow up with your Primary Care Provider.   You have been scheduled for a CT [facility] of the abdomen and pelvis [facility]  [address] [address]   You are scheduled on 03/11/20 at [place] should arrive 15 minutes prior to your appointment time for registration. Please follow the written instructions below on the day of your exam:  WARNING: IF YOU ARE ALLERGIC TO IODINE/X-RAY DYE, PLEASE NOTIFY RADIOLOGY IMMEDIATELY AT [phone]! YOU WILL BE GIVEN A 13 HOUR PREMEDICATION PREP.  1) Do not eat or drink anything after 7am (4 hours prior to your test) 2) You have been given 2 bottles of oral contrast to drink. The solution may taste better if refrigerated, but do NOT add ice or any other liquid to this solution. Shake well before drinking.    Drink 1 bottle of contrast @ 9am (2 hours prior to your exam)  Drink 1 bottle of contrast @ 10am (1 hour prior to your exam)  You may take any medications as prescribed with a small amount of water, if necessary. If you take any of the following medications: METFORMIN, GLUCOPHAGE, GLUCOVANCE, AVANDAMET, RIOMET, FORTAMET, Quentin MET, JANUMET, GLUMETZA or METAGLIP, you MAY be asked to HOLD this medication 48 hours AFTER the exam.  The purpose of you drinking the oral contrast is to aid in the visualization of your intestinal tract. The contrast solution may cause some diarrhea. Depending on your individual set of symptoms, you may also receive an intravenous injection of x-ray contrast/dye. Plan on being at  [facility] for 30 minutes or longer, depending on the type of exam you are having performed.  This test typically takes 30-45 minutes to complete.  If you have any questions regarding your exam or if you need to reschedule, you may call the CT department at [phone] between the hours of 8:00 am and 5:00 pm, Monday-Friday.  ________________________________________________________________________  Please go to the lab at [facility] ([place] You will need to go to level "B", you do not need an appointment for this. Hours available are 7:30 am - 4:30 pm.  You will need to have this done before your CT scan or your ct scan will be canceled.   Thank you,  Dr. Jackquline Denmark

## 2020-03-02 NOTE — Progress Notes (Signed)
Chief Complaint:   Referring Provider:  Mirna Mires, MD      ASSESSMENT AND PLAN;   #1. RUQ pain (neg [place] 08/2018, EGD 11/2018 for etiology)  #2. Abnormal LFTs d/t fatty liver (Dx on [place] 08/2018) likely d/t alcohol use.  Plan: - Protonix 40mg  po qd. - Proceed with CT abdo/pelvis - Stop drinking all alcohol. - Repeat CBC, CMP, lipase. - Zofran 4mg  S/L Q8hrs prn #30. NR - FU thereafter.   HPI:    Ray [name] is a 59 y.o. male  For follow-up visit Continues to have right-sided abdominal pain, occasionally made worse after eating.  Lately has been having left lower quadrant abdominal pain as well.  Associated with abdominal bloating and nausea but no vomiting.  No jaundice dark urine or pale stools. Unfortunately, he continues to drink alcohol despite medical advice. 24 pak with few shots of tequila over the weekend.  He does realize that some of the pain is related to drinking.  He wanted oxycodone which I politely refused.  He told me that Dr. Dia Sitter (his PCP) has also advised against narcotics.  Recently had pulmonary surgery November 2020 (Dr. 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  No nonsteroidals No fever or chills No jaundice or dark urine No previous history of pancreatitis. No weight loss.   Had screening colonoscopy colon 2014 at the age of 10 which was negative.  Told to get repeat colonoscopy in 10 years. Past Medical History:  Diagnosis Date  . Atelectasis    r/side  . Hypertension     Past Surgical History:  Procedure Laterality Date  . COLONOSCOPY     done at age 73 per pt and was told to have it in 10 years again  . STAPLING OF BLEBS Right 12/20/2017   Procedure: STAPLING OF BLEBS;  Surgeon: 46, MD;  Location: [facility] OR;  Service: Thoracic;  Laterality: Right;  . STAPLING OF BLEBS Left 11/10/2019   Procedure: STAPLING OF BLEBS;  Surgeon: [facility], MD;  Location: [facility] OR;  Service: Thoracic;   Laterality: Left;  Delight Ovens VIDEO ASSISTED THORACOSCOPY Right 12/20/2017   Procedure: VIDEO ASSISTED THORACOSCOPY;  Surgeon: Marland Kitchen, MD;  Location: [facility] OR;  Service: Thoracic;  Laterality: Right;  Delight Ovens VIDEO ASSISTED THORACOSCOPY Left 11/10/2019   Procedure: VIDEO ASSISTED THORACOSCOPY;  Surgeon: Marland Kitchen, MD;  Location: [facility] OR;  Service: Thoracic;  Laterality: Left;  Delight Ovens VIDEO BRONCHOSCOPY N/A 12/20/2017   Procedure: VIDEO BRONCHOSCOPY;  Surgeon: Marland Kitchen, MD;  Location: [facility] OR;  Service: Thoracic;  Laterality: N/A;  . VIDEO BRONCHOSCOPY N/A 11/10/2019   Procedure: VIDEO BRONCHOSCOPY;  Surgeon: [facility], MD;  Location: [facility] OR;  Service: Thoracic;  Laterality: N/A;    Family History  Problem Relation Age of Onset  . Hypertension Mother   . Colon cancer Neg Hx   . Esophageal cancer Neg Hx     Social History   Tobacco Use  . Smoking status: Former Delight Ovens  . Smokeless tobacco: Never Used  . Tobacco comment: quit 20 years ago  Substance Use Topics  . Alcohol use: Yes    Comment: pt reports 1 beer daily  . Drug use: Yes    Types: Marijuana    Current Outpatient Medications  Medication Sig Dispense Refill  . amLODipine (NORVASC) 10 MG tablet Take 1 tablet (10 mg total) by mouth daily. 30 tablet 0  . atorvastatin (LIPITOR) 10 MG tablet Take 10  mg by mouth at bedtime.    . bisoprolol-hydrochlorothiazide (ZIAC) 10-6.25 MG per tablet Take 1 tablet by mouth daily. 30 tablet 0  . losartan (COZAAR) 100 MG tablet Take 100 mg by mouth every morning.  1  . pantoprazole (PROTONIX) 40 MG tablet TAKE 1 TABLET BY MOUTH EVERY DAY (Patient taking differently: Take 40 mg by mouth daily. ) 30 tablet 1  . traZODone (DESYREL) 50 MG tablet Take 50 mg by mouth at bedtime as needed for sleep.   4  . ondansetron (ZOFRAN) 4 MG tablet Take 1 tablet (4 mg total) by mouth daily as needed for nausea or vomiting. (Patient not taking: Reported on 03/02/2020) 30 tablet 1   No current  facility-administered medications for this visit.    Allergies  Allergen Reactions  . Asa [Aspirin] Nausea Only  . Lisinopril Swelling    Lip/Tongue swelling     Review of Systems:  Constitutional: Denies fever, chills, diaphoresis, appetite change and fatigue.  HEENT: Denies photophobia, eye pain, redness, hearing loss, ear pain, congestion, sore throat, rhinorrhea, sneezing, mouth sores, neck pain, neck stiffness and tinnitus.   Respiratory: Some SOB, DOE and wheezing.  Has COPD. Cardiovascular: Denies chest pain, palpitations and leg swelling.  Genitourinary: Denies dysuria, urgency, frequency, hematuria, flank pain and difficulty urinating.  Musculoskeletal: Denies myalgias, back pain, joint swelling, arthralgias and gait problem.  Skin: No rash.  Neurological: Denies dizziness, seizures, syncope, weakness, light-headedness, numbness and headaches.  Hematological: Denies adenopathy. Easy bruising, personal or family bleeding history  Psychiatric/Behavioral: Has anxiety, no depression.  Has a very stressful job.     Physical Exam:    BP 118/70   Pulse 79   Temp (!) 97.3 F (36.3 C)   Ht 6' (1.829 m)   Wt 152 lb 8 oz (69.2 kg)   BMI 20.68 kg/m  Filed Weights   03/02/20 1136  Weight: 152 lb 8 oz (69.2 kg)   neg  Data Reviewed: I have personally reviewed following labs and imaging studies  CBC: CBC Latest Ref Rng & Units 11/12/2019 11/11/2019 11/09/2019  WBC 4.0 - 10.5 K/uL 6.1 9.1 5.5  Hemoglobin 13.0 - 17.0 g/dL 10.8(L) 11.4(L) 12.7(L)  Hematocrit 39.0 - 52.0 % 32.4(L) 34.7(L) 37.5(L)  Platelets 150 - 400 K/uL 251 266 245    CMP: CMP Latest Ref Rng & Units 11/12/2019 11/11/2019 11/09/2019  Glucose 70 - 99 mg/dL 96 125(H) 117(H)  BUN 6 - 20 mg/dL 7 9 <5(L)  Creatinine 0.61 - 1.24 mg/dL 1.01 0.99 0.98  Sodium 135 - 145 mmol/L 141 136 138  Potassium 3.5 - 5.1 mmol/L 3.7 3.9 3.8  Chloride 98 - 111 mmol/L 107 104 105  CO2 22 - 32 mmol/L 23 21(L) 24  Calcium 8.9  - 10.3 mg/dL 8.7(L) 8.5(L) 9.0  Total Protein 6.5 - 8.1 g/dL 5.5(L) - 7.0  Total Bilirubin 0.3 - 1.2 mg/dL 0.4 - 0.4  Alkaline Phos 38 - 126 U/L 38 - 54  AST 15 - 41 U/L 20 - 38  ALT 0 - 44 U/L 17 - 34   Lipase     Component Value Date/Time   LIPASE 63 (H) 09/11/2018 1532  Ultrasound 09/11/2018-hepatic steatosis, no gallstones.   Carmell Austria, MD 03/02/2020, 11:45 AM  Cc: Iona Beard, MD

## 2020-03-02 NOTE — Addendum Note (Signed)
Addended by: Junius Roads on: 03/02/2020 03:25 PM   Modules accepted: Orders

## 2020-03-02 NOTE — Progress Notes (Deleted)
[address]       [place] [address]             [phone]      Partick [facility]usselman [facility] Medical Record #[id number] Date of Birth: 1961/01/17  Referring: Bethel Born, PA-C Primary Care: Mirna Mires, MD Primary Cardiologist: No primary care provider on file.   Chief Complaint:   POST OP FOLLOW UP OPERATIVE REPORT DATE OF PROCEDURE:  11/10/2019 PREOPERATIVE DIAGNOSIS:  Spontaneous left pneumothorax with multiple apical bullae. POSTOPERATIVE DIAGNOSIS:  Spontaneous left pneumothorax with multiple apical bullae. SURGICAL PROCEDURE:  Bronchoscopy, left video-assisted thoracoscopy, stapling of apical blebs, mechanical pleurodesis, intercostal nerve block with Exparel.  History of Present Illness:      Patient doing well following repair of spontaneous left pneumothorax with multiple blebs.  Patient denies shortness of breath notes he does get fatigued.  Patient complains of some dysesthesias over the left lateral chest    Past Medical History:  Diagnosis Date  . Atelectasis    r/side  . Hypertension      Social History   Tobacco Use  Smoking Status Former Smoker  Smokeless Tobacco Never Used  Tobacco Comment   quit 20 years ago    Social History   Substance and Sexual Activity  Alcohol Use Yes   Comment: pt reports 1 beer daily     Allergies  Allergen Reactions  . Asa [Aspirin] Nausea Only  . Lisinopril Swelling    Lip/Tongue swelling     Current Outpatient Medications  Medication Sig Dispense Refill  . amLODipine (NORVASC) 10 MG tablet Take 1 tablet (10 mg total) by mouth daily. 30 tablet 0  . atorvastatin (LIPITOR) 10 MG tablet Take 10 mg by mouth at bedtime.    . bisoprolol-hydrochlorothiazide (ZIAC) 10-6.25 MG per tablet Take 1 tablet by mouth daily. 30 tablet 0  . losartan (COZAAR) 100 MG tablet Take 100 mg by mouth every morning.  1  . ondansetron (ZOFRAN) 4 MG tablet Take 1 tablet (4 mg total) by mouth daily as  needed for nausea or vomiting. (Patient not taking: Reported on 03/02/2020) 30 tablet 1  . pantoprazole (PROTONIX) 40 MG tablet Take 1 tablet (40 mg total) by mouth daily. 30 tablet 11  . traZODone (DESYREL) 50 MG tablet Take 50 mg by mouth at bedtime as needed for sleep.   4   No current facility-administered medications for this visit.       Physical Exam: There were no vitals taken for this visit.  {physical exam:21449} Diagnostic Studies & Laboratory data:     Recent Radiology Findings:   No results found.  I have independently reviewed the above radiology studies  and reviewed the findings with the patient.    Recent Lab Findings: Lab Results  Component Value Date   WBC 6.1 11/12/2019   HGB 10.8 (L) 11/12/2019   HCT 32.4 (L) 11/12/2019   PLT 251 11/12/2019   GLUCOSE 96 11/12/2019   ALT 17 11/12/2019   AST 20 11/12/2019   NA 141 11/12/2019   K 3.7 11/12/2019   CL 107 11/12/2019   CREATININE 1.01 11/12/2019   BUN 7 11/12/2019   CO2 23 11/12/2019   INR 1.0 11/09/2019      Assessment / Plan:       Medication Changes: No orders of the defined types were placed in this encounter.     Delight Ovens MD      [address] [place] [address] Office [phone]     03/02/2020 1:18 PM

## 2020-03-03 ENCOUNTER — Encounter: Payer: 59 | Admitting: Cardiothoracic Surgery

## 2020-03-03 NOTE — Progress Notes (Deleted)
[address]       [place] [address]             [phone]      Ray [name] [facility] Medical Record #[id number] Date of Birth: 12-28-1960  Referring: Bethel Born, PA-C Primary Care: Mirna Mires, MD Primary Cardiologist: No primary care provider on file.   Chief Complaint:   POST OP FOLLOW UP OPERATIVE REPORT DATE OF PROCEDURE:  11/10/2019 PREOPERATIVE DIAGNOSIS:  Spontaneous left pneumothorax with multiple apical bullae. POSTOPERATIVE DIAGNOSIS:  Spontaneous left pneumothorax with multiple apical bullae. SURGICAL PROCEDURE:  Bronchoscopy, left video-assisted thoracoscopy, stapling of apical blebs, mechanical pleurodesis, intercostal nerve block with Exparel.  History of Present Illness:      Patient doing well following repair of spontaneous left pneumothorax with multiple blebs.  Patient denies shortness of breath notes he does get fatigued.  Patient complains of some dysesthesias over the left lateral chest    Past Medical History:  Diagnosis Date  . Atelectasis    r/side  . Hypertension      Social History   Tobacco Use  Smoking Status Former Smoker  Smokeless Tobacco Never Used  Tobacco Comment   quit 20 years ago    Social History   Substance and Sexual Activity  Alcohol Use Yes   Comment: pt reports 1 beer daily     Allergies  Allergen Reactions  . Asa [Aspirin] Nausea Only  . Lisinopril Swelling    Lip/Tongue swelling     Current Outpatient Medications  Medication Sig Dispense Refill  . amLODipine (NORVASC) 10 MG tablet Take 1 tablet (10 mg total) by mouth daily. 30 tablet 0  . atorvastatin (LIPITOR) 10 MG tablet Take 10 mg by mouth at bedtime.    . bisoprolol-hydrochlorothiazide (ZIAC) 10-6.25 MG per tablet Take 1 tablet by mouth daily. 30 tablet 0  . losartan (COZAAR) 100 MG tablet Take 100 mg by mouth every morning.  1  . ondansetron (ZOFRAN ODT) 4 MG disintegrating tablet Take 1 tablet (4 mg total) by  mouth every 8 (eight) hours as needed for nausea or vomiting. 30 tablet 0  . ondansetron (ZOFRAN) 4 MG tablet Take 1 tablet (4 mg total) by mouth daily as needed for nausea or vomiting. (Patient not taking: Reported on 03/02/2020) 30 tablet 1  . pantoprazole (PROTONIX) 40 MG tablet Take 1 tablet (40 mg total) by mouth daily. 30 tablet 11  . traZODone (DESYREL) 50 MG tablet Take 50 mg by mouth at bedtime as needed for sleep.   4   No current facility-administered medications for this visit.       Physical Exam: There were no vitals taken for this visit.  {physical exam:21449} Diagnostic Studies & Laboratory data:     Recent Radiology Findings:   No results found.  I have independently reviewed the above radiology studies  and reviewed the findings with the patient.    Recent Lab Findings: Lab Results  Component Value Date   WBC 3.4 (L) 03/02/2020   HGB 12.1 (L) 03/02/2020   HCT 35.7 (L) 03/02/2020   PLT 263.0 03/02/2020   GLUCOSE 94 03/02/2020   ALT 35 03/02/2020   AST 51 (H) 03/02/2020   NA 143 03/02/2020   K 2.9 (L) 03/02/2020   CL 106 03/02/2020   CREATININE 1.12 03/02/2020   BUN 8 03/02/2020   CO2 29 03/02/2020   INR 1.0 11/09/2019      Assessment / Plan:  Medication Changes: No orders of the defined types were placed in this encounter.     Grace Isaac MD      [address] [place] [address] Office [phone]     03/03/2020 3:54 PM

## 2020-03-03 NOTE — Progress Notes (Deleted)
This encounter was created in error - please disregard.

## 2020-03-03 NOTE — Progress Notes (Deleted)
[address]       [place] [address]             [phone]      Joyce Leckey [facility] Medical Record #[id number] Date of Birth: Feb 19, 1961  Referring: Bethel Born, PA-C Primary Care: Mirna Mires, MD Primary Cardiologist: No primary care provider on file.   Chief Complaint:   POST OP FOLLOW UP OPERATIVE REPORT DATE OF PROCEDURE:  11/10/2019 PREOPERATIVE DIAGNOSIS:  Spontaneous left pneumothorax with multiple apical bullae. POSTOPERATIVE DIAGNOSIS:  Spontaneous left pneumothorax with multiple apical bullae. SURGICAL PROCEDURE:  Bronchoscopy, left video-assisted thoracoscopy, stapling of apical blebs, mechanical pleurodesis, intercostal nerve block with Exparel.  History of Present Illness:      Patient doing well following repair of spontaneous left pneumothorax with multiple blebs.  Patient denies shortness of breath notes he does get fatigued.  Patient complains of some dysesthesias over the left lateral chest    Past Medical History:  Diagnosis Date  . Atelectasis    r/side  . Hypertension      Social History   Tobacco Use  Smoking Status Former Smoker  Smokeless Tobacco Never Used  Tobacco Comment   quit 20 years ago    Social History   Substance and Sexual Activity  Alcohol Use Yes   Comment: pt reports 1 beer daily     Allergies  Allergen Reactions  . Asa [Aspirin] Nausea Only  . Lisinopril Swelling    Lip/Tongue swelling     Current Outpatient Medications  Medication Sig Dispense Refill  . amLODipine (NORVASC) 10 MG tablet Take 1 tablet (10 mg total) by mouth daily. 30 tablet 0  . atorvastatin (LIPITOR) 10 MG tablet Take 10 mg by mouth at bedtime.    . bisoprolol-hydrochlorothiazide (ZIAC) 10-6.25 MG per tablet Take 1 tablet by mouth daily. 30 tablet 0  . losartan (COZAAR) 100 MG tablet Take 100 mg by mouth every morning.  1  . ondansetron (ZOFRAN ODT) 4 MG disintegrating tablet Take 1 tablet (4 mg total) by  mouth every 8 (eight) hours as needed for nausea or vomiting. 30 tablet 0  . ondansetron (ZOFRAN) 4 MG tablet Take 1 tablet (4 mg total) by mouth daily as needed for nausea or vomiting. (Patient not taking: Reported on 03/02/2020) 30 tablet 1  . pantoprazole (PROTONIX) 40 MG tablet Take 1 tablet (40 mg total) by mouth daily. 30 tablet 11  . traZODone (DESYREL) 50 MG tablet Take 50 mg by mouth at bedtime as needed for sleep.   4   No current facility-administered medications for this visit.       Physical Exam: There were no vitals taken for this visit.  {physical exam:21449} Diagnostic Studies & Laboratory data:     Recent Radiology Findings:   No results found.  I have independently reviewed the above radiology studies  and reviewed the findings with the patient.    Recent Lab Findings: Lab Results  Component Value Date   WBC 3.4 (L) 03/02/2020   HGB 12.1 (L) 03/02/2020   HCT 35.7 (L) 03/02/2020   PLT 263.0 03/02/2020   GLUCOSE 94 03/02/2020   ALT 35 03/02/2020   AST 51 (H) 03/02/2020   NA 143 03/02/2020   K 2.9 (L) 03/02/2020   CL 106 03/02/2020   CREATININE 1.12 03/02/2020   BUN 8 03/02/2020   CO2 29 03/02/2020   INR 1.0 11/09/2019      Assessment / Plan:  Medication Changes: No orders of the defined types were placed in this encounter.     Grace Isaac MD      [address] [place] [address] Office [phone]     03/03/2020 3:57 PM

## 2020-03-04 ENCOUNTER — Other Ambulatory Visit: Payer: Self-pay

## 2020-03-04 DIAGNOSIS — E876 Hypokalemia: Secondary | ICD-10-CM

## 2020-03-04 MED ORDER — POTASSIUM CHLORIDE CRYS ER 20 MEQ PO TBCR: 40.0000 meq | EXTENDED_RELEASE_TABLET | Freq: Every day | ORAL | 0 refills | Status: DC

## 2020-03-07 ENCOUNTER — Telehealth: Payer: Self-pay | Admitting: Gastroenterology

## 2020-03-07 DIAGNOSIS — R1011 Right upper quadrant pain: Secondary | ICD-10-CM

## 2020-03-07 DIAGNOSIS — R945 Abnormal results of liver function studies: Secondary | ICD-10-CM

## 2020-03-07 DIAGNOSIS — R7989 Other specified abnormal findings of blood chemistry: Secondary | ICD-10-CM

## 2020-03-07 NOTE — Telephone Encounter (Signed)
Pt is scheduled for a CT ABD/Pelvis on 03-11-2020 and his insurance denied the authorization due to it "not being medically necessary" and a ultrasound would need to be ordered first.   A peer to peer can be done at [phone] Ref# [id number]  Please advise next step. Thanks!

## 2020-03-07 NOTE — Telephone Encounter (Signed)
Patient returned call to the office-patient informed of change in test per insurance approval-patient is agreeable to complete this test as well as the CXR that was ordered by Dr. Foxx Sage; patient was given information concerning NOT drinking the contrast and to arrive at 10:45 am and to be NPO after 5:00 am; Patient advised to call back to the office at [phone] should questions/concerns arise;  Patient verbalized understanding of information/instructions;

## 2020-03-07 NOTE — Telephone Encounter (Signed)
Can do ultrasound first -Abdo complete RG

## 2020-03-07 NOTE — Telephone Encounter (Signed)
Left message for patient to call back to the office;   RN cancelled CT on 03/11/2020 at [facility]; patient has been rescheduled for [place] abd complete on 03/11/2020 at 11:00 am; patient is to be NPO after 5:00 am; remind patient NOT to drink the contrast that was originally for that same day; can take meds with a sip of water if needed; patient to also complete CXR ordered by Dr. Esley Sage that day as well if needed;

## 2020-03-10 ENCOUNTER — Other Ambulatory Visit: Payer: Self-pay | Admitting: Orthopedic Surgery

## 2020-03-10 DIAGNOSIS — M25512 Pain in left shoulder: Secondary | ICD-10-CM

## 2020-03-11 ENCOUNTER — Other Ambulatory Visit (HOSPITAL_BASED_OUTPATIENT_CLINIC_OR_DEPARTMENT_OTHER): Payer: 59

## 2020-03-11 ENCOUNTER — Ambulatory Visit (HOSPITAL_BASED_OUTPATIENT_CLINIC_OR_DEPARTMENT_OTHER)
Admission: RE | Admit: 2020-03-11 | Discharge: 2020-03-11 | Disposition: A | Discharge status: Home / Self Care | Payer: PRIVATE HEALTH INSURANCE | Source: Ambulatory Visit | Attending: Cardiothoracic Surgery | Admitting: Cardiothoracic Surgery

## 2020-03-11 ENCOUNTER — Other Ambulatory Visit: Payer: Self-pay

## 2020-03-11 ENCOUNTER — Ambulatory Visit (HOSPITAL_BASED_OUTPATIENT_CLINIC_OR_DEPARTMENT_OTHER)
Admission: RE | Admit: 2020-03-11 | Discharge: 2020-03-11 | Disposition: A | Discharge status: Home / Self Care | Payer: PRIVATE HEALTH INSURANCE | Source: Ambulatory Visit | Attending: Gastroenterology | Admitting: Gastroenterology

## 2020-03-11 DIAGNOSIS — J439 Emphysema, unspecified: Secondary | ICD-10-CM

## 2020-03-11 DIAGNOSIS — R945 Abnormal results of liver function studies: Secondary | ICD-10-CM | POA: Insufficient documentation

## 2020-03-11 DIAGNOSIS — R7989 Other specified abnormal findings of blood chemistry: Secondary | ICD-10-CM

## 2020-03-11 DIAGNOSIS — R1011 Right upper quadrant pain: Secondary | ICD-10-CM | POA: Diagnosis not present

## 2020-03-28 ENCOUNTER — Ambulatory Visit
Admission: RE | Admit: 2020-03-28 | Discharge: 2020-03-28 | Disposition: A | Discharge status: Home / Self Care | Payer: 59 | Source: Ambulatory Visit | Attending: Orthopedic Surgery | Admitting: Orthopedic Surgery

## 2020-03-28 ENCOUNTER — Other Ambulatory Visit: Payer: Self-pay

## 2020-03-28 DIAGNOSIS — M25512 Pain in left shoulder: Secondary | ICD-10-CM

## 2020-05-11 ENCOUNTER — Other Ambulatory Visit: Payer: Self-pay

## 2020-05-11 ENCOUNTER — Ambulatory Visit
Admission: RE | Admit: 2020-05-11 | Discharge: 2020-05-11 | Disposition: A | Discharge status: Home / Self Care | Payer: 59 | Source: Ambulatory Visit | Attending: Family Medicine | Admitting: Family Medicine

## 2020-05-11 ENCOUNTER — Other Ambulatory Visit: Payer: Self-pay | Admitting: Family Medicine

## 2020-05-11 DIAGNOSIS — M25572 Pain in left ankle and joints of left foot: Secondary | ICD-10-CM

## 2020-05-29 ENCOUNTER — Other Ambulatory Visit: Payer: Self-pay

## 2020-05-29 ENCOUNTER — Emergency Department (HOSPITAL_COMMUNITY)
Admission: EM | Admit: 2020-05-29 | Discharge: 2020-05-29 | Disposition: A | Discharge status: Home / Self Care | Payer: PRIVATE HEALTH INSURANCE | Attending: Emergency Medicine | Admitting: Emergency Medicine

## 2020-05-29 ENCOUNTER — Emergency Department (HOSPITAL_COMMUNITY): Payer: PRIVATE HEALTH INSURANCE

## 2020-05-29 ENCOUNTER — Encounter (HOSPITAL_COMMUNITY): Payer: Self-pay

## 2020-05-29 DIAGNOSIS — Z79899 Other long term (current) drug therapy: Secondary | ICD-10-CM | POA: Insufficient documentation

## 2020-05-29 DIAGNOSIS — L02611 Cutaneous abscess of right foot: Secondary | ICD-10-CM | POA: Insufficient documentation

## 2020-05-29 DIAGNOSIS — I10 Essential (primary) hypertension: Secondary | ICD-10-CM | POA: Diagnosis not present

## 2020-05-29 DIAGNOSIS — Z87891 Personal history of nicotine dependence: Secondary | ICD-10-CM | POA: Diagnosis not present

## 2020-05-29 DIAGNOSIS — L03115 Cellulitis of right lower limb: Secondary | ICD-10-CM | POA: Insufficient documentation

## 2020-05-29 DIAGNOSIS — L02619 Cutaneous abscess of unspecified foot: Secondary | ICD-10-CM

## 2020-05-29 DIAGNOSIS — IMO0002 Reserved for concepts with insufficient information to code with codable children: Secondary | ICD-10-CM

## 2020-05-29 DIAGNOSIS — L539 Erythematous condition, unspecified: Secondary | ICD-10-CM | POA: Diagnosis present

## 2020-05-29 LAB — CBC WITH DIFFERENTIAL/PLATELET
Abs Immature Granulocytes: 0.09 10*3/uL — ABNORMAL HIGH (ref 0.00–0.07)
Basophils Absolute: 0 10*3/uL (ref 0.0–0.1)
Basophils Relative: 0 %
Eosinophils Absolute: 0 10*3/uL (ref 0.0–0.5)
Eosinophils Relative: 0 %
HCT: 42.3 % (ref 39.0–52.0)
Hemoglobin: 13.8 g/dL (ref 13.0–17.0)
Immature Granulocytes: 1 %
Lymphocytes Relative: 5 %
Lymphs Abs: 0.8 10*3/uL (ref 0.7–4.0)
MCH: 32.7 pg (ref 26.0–34.0)
MCHC: 32.6 g/dL (ref 30.0–36.0)
MCV: 100.2 fL — ABNORMAL HIGH (ref 80.0–100.0)
Monocytes Absolute: 1.4 10*3/uL — ABNORMAL HIGH (ref 0.1–1.0)
Monocytes Relative: 9 %
Neutro Abs: 13.7 10*3/uL — ABNORMAL HIGH (ref 1.7–7.7)
Neutrophils Relative %: 85 %
Platelets: 263 10*3/uL (ref 150–400)
RBC: 4.22 MIL/uL (ref 4.22–5.81)
RDW: 13.4 % (ref 11.5–15.5)
WBC: 16 10*3/uL — ABNORMAL HIGH (ref 4.0–10.5)
nRBC: 0 % (ref 0.0–0.2)

## 2020-05-29 LAB — LACTIC ACID, PLASMA
Lactic Acid, Venous: 2.2 mmol/L (ref 0.5–1.9)
Lactic Acid, Venous: 0.9 mmol/L (ref 0.5–1.9)

## 2020-05-29 LAB — BASIC METABOLIC PANEL
Anion gap: 13 (ref 5–15)
BUN: 7 mg/dL (ref 6–20)
CO2: 24 mmol/L (ref 22–32)
Calcium: 8.8 mg/dL — ABNORMAL LOW (ref 8.9–10.3)
Chloride: 101 mmol/L (ref 98–111)
Creatinine, Ser: 0.9 mg/dL (ref 0.61–1.24)
GFR calc Af Amer: >60 mL/min (ref >60)
GFR calc non Af Amer: >60 mL/min (ref >60)
Glucose, Bld: 98 mg/dL (ref 70–99)
Potassium: 3.1 mmol/L — ABNORMAL LOW (ref 3.5–5.1)
Sodium: 138 mmol/L (ref 135–145)

## 2020-05-29 MED ORDER — CLINDAMYCIN HCL 150 MG PO CAPS: 300.0000 mg | ORAL_CAPSULE | Freq: Once | ORAL | Status: DC

## 2020-05-29 MED ORDER — LIDOCAINE-EPINEPHRINE (PF) 2 %-1:200000 IJ SOLN
10.0000 mL | Freq: Once | INTRAMUSCULAR | Status: AC
  Administered 2020-05-29: 10 mL
  Filled 2020-05-29: qty 20

## 2020-05-29 MED ORDER — SODIUM CHLORIDE 0.9 % IV BOLUS
1000.0000 mL | Freq: Once | INTRAVENOUS | Status: AC
  Administered 2020-05-29: 1000 mL via INTRAVENOUS

## 2020-05-29 MED ORDER — CLINDAMYCIN HCL 150 MG PO CAPS: 450.0000 mg | ORAL_CAPSULE | Freq: Three times a day (TID) | ORAL | 0 refills | Status: AC

## 2020-05-29 MED ORDER — OXYCODONE HCL 5 MG PO TABS: 5.0000 mg | ORAL_TABLET | Freq: Four times a day (QID) | ORAL | 0 refills | Status: AC | PRN

## 2020-05-29 MED ORDER — OXYCODONE-ACETAMINOPHEN 5-325 MG PO TABS
1.0000 | ORAL_TABLET | Freq: Once | ORAL | Status: AC
  Administered 2020-05-29: 1 via ORAL
  Filled 2020-05-29: qty 1

## 2020-05-29 MED ORDER — CLINDAMYCIN PHOSPHATE 600 MG/50ML IV SOLN
600.0000 mg | Freq: Once | INTRAVENOUS | Status: AC
  Administered 2020-05-29: 600 mg via INTRAVENOUS
  Filled 2020-05-29: qty 50

## 2020-05-29 MED ORDER — ONDANSETRON HCL 4 MG/2ML IJ SOLN
4.0000 mg | Freq: Once | INTRAMUSCULAR | Status: AC
  Administered 2020-05-29: 4 mg via INTRAVENOUS
  Filled 2020-05-29: qty 2

## 2020-05-29 NOTE — ED Notes (Signed)
Pt in x-ray at this time

## 2020-05-29 NOTE — ED Notes (Signed)
Patient verbalizes understanding of discharge instructions. Opportunity for questioning and answers were provided. Armband removed by staff, pt discharged from ED in wheelchair to home with crutches.   

## 2020-05-29 NOTE — ED Provider Notes (Addendum)
[facility] EMERGENCY DEPARTMENT Provider Note   CSN: [id number] Arrival date & time: 05/29/20  1257     History No chief complaint on file.   Ray [name] is a 59 y.o. male presents to the ER for evaluation of right foot pain for 4 days.  Associated with redness, warmth and swelling.  He went to see his primary care doctor and he referred him to podiatry.  He has an appointment with podiatry on 6/24 but states he cannot tolerate the pain.  Patient's partner states he may have stepped on a toothpick but patient is not sure.  Pain is worse with weightbearing, movement, palpation.  He denies any fevers.  Denies to history of blood clots.  No calf pain or swelling.  No chest pain or shortness of breath.  Denies IV drug use.  Denies history of diabetes.  No other associate symptoms.  No alleviating factors.  HPI     Past Medical History:  Diagnosis Date  . Atelectasis    r/side  . Hypertension     Patient Active Problem List   Diagnosis Date Noted  . Hypokalemia 11/06/2019  . Lung blebs (HCC) 12/20/2017  . Pneumothorax on right 12/19/2017  . Pneumothorax 02/26/2017  . Hypertension 02/26/2017  . Nicotine dependence 02/26/2017  . Spontaneous pneumothorax 02/26/2017    Past Surgical History:  Procedure Laterality Date  . COLONOSCOPY     done at age 49 per pt and was told to have it in 10 years again  . STAPLING OF BLEBS Right 12/20/2017   Procedure: STAPLING OF BLEBS;  Surgeon: Delight Ovens, MD;  Location: [facility] OR;  Service: Thoracic;  Laterality: Right;  . STAPLING OF BLEBS Left 11/10/2019   Procedure: STAPLING OF BLEBS;  Surgeon: Delight Ovens, MD;  Location: [facility] OR;  Service: Thoracic;  Laterality: Left;  Marland Kitchen VIDEO ASSISTED THORACOSCOPY Right 12/20/2017   Procedure: VIDEO ASSISTED THORACOSCOPY;  Surgeon: Delight Ovens, MD;  Location: [facility] OR;  Service: Thoracic;  Laterality: Right;  Marland Kitchen VIDEO ASSISTED THORACOSCOPY Left 11/10/2019   Procedure:  VIDEO ASSISTED THORACOSCOPY;  Surgeon: Delight Ovens, MD;  Location: [facility] OR;  Service: Thoracic;  Laterality: Left;  Marland Kitchen VIDEO BRONCHOSCOPY N/A 12/20/2017   Procedure: VIDEO BRONCHOSCOPY;  Surgeon: Delight Ovens, MD;  Location: [facility] OR;  Service: Thoracic;  Laterality: N/A;  . VIDEO BRONCHOSCOPY N/A 11/10/2019   Procedure: VIDEO BRONCHOSCOPY;  Surgeon: Delight Ovens, MD;  Location: [facility] OR;  Service: Thoracic;  Laterality: N/A;       Family History  Problem Relation Age of Onset  . Hypertension Mother   . Colon cancer Neg Hx   . Esophageal cancer Neg Hx     Social History   Tobacco Use  . Smoking status: Former Games developer  . Smokeless tobacco: Never Used  . Tobacco comment: quit 20 years ago  Substance Use Topics  . Alcohol use: Yes    Comment: pt reports 1 beer daily  . Drug use: Yes    Types: Marijuana    Home Medications Prior to Admission medications   Medication Sig Start Date End Date Taking? Authorizing Provider  amLODipine (NORVASC) 10 MG tablet Take 1 tablet (10 mg total) by mouth daily. 11/13/19   Noralee Stain, DO  atorvastatin (LIPITOR) 10 MG tablet Take 10 mg by mouth at bedtime. 11/02/19   [provider]  bisoprolol-hydrochlorothiazide (ZIAC) 10-6.25 MG per tablet Take 1 tablet by mouth daily. 12/23/13   Radford Pax,  Molly Maduro, MD  clindamycin (CLEOCIN) 150 MG capsule Take 3 capsules (450 mg total) by mouth 3 (three) times daily for 7 days. 05/29/20 06/05/20  Liberty Handy, PA-C  losartan (COZAAR) 100 MG tablet Take 100 mg by mouth every morning. 02/16/17   [provider]  ondansetron (ZOFRAN ODT) 4 MG disintegrating tablet Take 1 tablet (4 mg total) by mouth every 8 (eight) hours as needed for nausea or vomiting. 03/02/20   Lynann Bologna, MD  ondansetron (ZOFRAN) 4 MG tablet Take 1 tablet (4 mg total) by mouth daily as needed for nausea or vomiting. Patient not taking: Reported on 03/02/2020 11/12/19 11/11/20  Noralee Stain, DO  oxyCODONE (OXY  IR/ROXICODONE) 5 MG immediate release tablet Take 1 tablet (5 mg total) by mouth every 6 (six) hours as needed for up to 3 days for severe pain. 05/29/20 06/01/20  Liberty Handy, PA-C  pantoprazole (PROTONIX) 40 MG tablet Take 1 tablet (40 mg total) by mouth daily. 03/02/20   Lynann Bologna, MD  potassium chloride SA (KLOR-CON) 20 MEQ tablet Take 2 tablets (40 mEq total) by mouth daily for 7 doses. 03/04/20 03/11/20  Lynann Bologna, MD  traZODone (DESYREL) 50 MG tablet Take 50 mg by mouth at bedtime as needed for sleep.  01/29/17   [provider]    Allergies    Asa [aspirin] and Lisinopril  Review of Systems   Review of Systems  Musculoskeletal: Positive for arthralgias, gait problem and joint swelling.  All other systems reviewed and are negative.   Physical Exam Updated Vital Signs BP (!) 194/96 (BP Location: Right Arm)   Pulse 100   Temp 99.9 F (37.7 C) (Oral)   Resp 18   SpO2 96%   Physical Exam Constitutional:      Appearance: He is well-developed.  HENT:     Head: Normocephalic.     Nose: Nose normal.  Eyes:     General: Lids are normal.  Cardiovascular:     Rate and Rhythm: Normal rate.  Pulmonary:     Effort: Pulmonary effort is normal. No respiratory distress.  Musculoskeletal:        General: Normal range of motion.     Cervical back: Normal range of motion.       Feet:  Feet:     Right foot:     Skin integrity: Erythema and warmth present.     Comments: Area of fluctuance, erythema, warmth, swelling with point of maximal tenderness at base of 3rd/4th MTP on sole aspect of foot where there is a very small puncture type wound. Decreased ROM of these toes secondary to pain.  There is surrounding cellulitis to midfoot dorsally. No skin breakdown in between toes. No focal tenderness of ankle, calcaneous. Full ROM of ankle with only mild pain.  Neurological:     Mental Status: He is alert.  Psychiatric:        Behavior: Behavior normal.            ED Results / Procedures / Treatments   Labs (all labs ordered are listed, but only abnormal results are displayed) Labs Reviewed  CBC WITH DIFFERENTIAL/PLATELET - Abnormal; Notable for the following components:      Result Value   WBC 16.0 (*)    MCV 100.2 (*)    Neutro Abs 13.7 (*)    Monocytes Absolute 1.4 (*)    Abs Immature Granulocytes 0.09 (*)    All other components within normal limits  BASIC METABOLIC PANEL -  Abnormal; Notable for the following components:   Potassium 3.1 (*)    Calcium 8.8 (*)    All other components within normal limits  LACTIC ACID, PLASMA - Abnormal; Notable for the following components:   Lactic Acid, Venous 2.2 (*)    All other components within normal limits  CULTURE, BLOOD (ROUTINE X 2)  CULTURE, BLOOD (ROUTINE X 2)  AEROBIC CULTURE (SUPERFICIAL SPECIMEN)  LACTIC ACID, PLASMA    EKG None  Radiology DG Foot Complete Right  Result Date: 05/29/2020 CLINICAL DATA:  RIGHT foot swelling and pain, erythema, fluctuance and warmth at bottom of third and fourth toes EXAM: RIGHT FOOT COMPLETE - 3+ VIEW COMPARISON:  05/11/2020 FINDINGS: Osseous mineralization normal. Joint spaces preserved. No acute fracture or dislocation. Small focus of benign-appearing cortical lucency at third metatarsal shaft unchanged. Mild soft tissue swelling overlying the dorsum of the metatarsals. No soft tissue gas or radiopaque foreign bodies. IMPRESSION: No acute osseous abnormalities. Electronically Signed   By: Ulyses Southward M.D.   On: 05/29/2020 16:04    Procedures .Marland KitchenIncision and Drainage  Date/Time: 05/29/2020 7:21 PM Performed by: Liberty Handy, PA-C Authorized by: Liberty Handy, PA-C   Consent:    Consent obtained:  Verbal   Consent given by:  Patient   Risks discussed:  Bleeding, incomplete drainage, pain and damage to other organs   Alternatives discussed:  No treatment Universal protocol:    Procedure explained and questions answered  to patient or proxy's satisfaction: yes     Relevant documents present and verified: yes     Test results available and properly labeled: yes     Imaging studies available: yes     Required blood products, implants, devices, and special equipment available: yes     Site/side marked: yes     Immediately prior to procedure a time out was called: yes     Patient identity confirmed:  Verbally with patient Location:    Type:  Abscess   Location:  Lower extremity   Lower extremity location:  Foot Pre-procedure details:    Skin preparation:  Betadine Anesthesia (see MAR for exact dosages):    Anesthesia method:  Local infiltration   Local anesthetic:  Lidocaine 2% WITH epi Procedure type:    Complexity:  Simple Procedure details:    Incision types:  Single straight   Incision depth:  Subcutaneous   Scalpel blade:  11   Wound management:  Probed and deloculated, irrigated with saline and extensive cleaning   Drainage:  Purulent   Drainage amount:  Moderate Post-procedure details:    Patient tolerance of procedure:  Tolerated well, no immediate complications   (including critical care time)  Medications Ordered in ED Medications  oxyCODONE-acetaminophen (PERCOCET/ROXICET) 5-325 MG per tablet 1 tablet (1 tablet Oral Given 05/29/20 1546)  sodium chloride 0.9 % bolus 1,000 mL (0 mLs Intravenous Stopped 05/29/20 2207)  clindamycin (CLEOCIN) IVPB 600 mg (0 mg Intravenous Stopped 05/29/20 2120)  lidocaine-EPINEPHrine (XYLOCAINE W/EPI) 2 %-1:200000 (PF) injection 10 mL (10 mLs Infiltration Given 05/29/20 2001)  ondansetron (ZOFRAN) injection 4 mg (4 mg Intravenous Given 05/29/20 2120)    ED Course  I have reviewed the triage vital signs and the nursing notes.  Pertinent labs & imaging results that were available during my care of the patient were reviewed by me and considered in my medical decision making (see chart for details).  Clinical Course as of Jun 01 919  Sun May 29, 2020  1650  FINDINGS: Osseous  mineralization normal.  Joint spaces preserved.  No acute fracture or dislocation.  Small focus of benign-appearing cortical lucency at third metatarsal shaft unchanged.  Mild soft tissue swelling overlying the dorsum of the metatarsals.  No soft tissue gas or radiopaque foreign bodies.  IMPRESSION: No acute osseous abnormalities.  DG Foot Complete Right [CG]  1650 Temp: 99.1 F (37.3 C) [CG]  1650 Pulse Rate: 82 [CG]  1650 Resp: 18 [CG]  1650 WBC(!): 16.0 [CG]  1650 Potassium(!): 3.1 [CG]  1650 Lactic Acid, Venous(!!): 2.2 [CG]    Clinical Course User Index [CG] Kinnie Feil, PA-C   MDM Rules/Calculators/A&P                      59 year old male presents for right foot pain for 4 days.  Seen by PCP and awaiting podiatry appointment on 6/24.  Exam reveals fluctuant, tender, warm and erythematous skin on the dorsum of the foot and inferiorly on the sole there is a small puncture type wound.  Patient's wife states he may have stepped on a toothpick.  I have ordered labs including CBC, BMP, lactic acid, blood cultures, wound culture.  I have ordered x-ray of the foot.  I reviewed patient's EMR, nursing notes to assist with MDM.  I obtained collateral information from patient's wife.  I personally visualized and interpreted lab work, imaging.  These reveal leukocytosis, mild lactic acidosis, hypokalemia.  X-ray shows soft tissue swelling but no soft tissue gas, foreign bodies or obvious acute bone abnormalities to suggest osteomyelitis.  Differential diagnoses includes abscess with surrounding cellulitis.  History and exam is not consistent with gout.  I do not think this is septic arthritis.  No history of IV drug use.  No fevers. No SIRS criteria.   I&D performed here without immediate complications.  Moderate amount of purulent drainage obtained.  Wound culture pending.  We will give IV fluids, IV clindamycin and recheck lactic acid.  PO  percocet.   2038: Delay in starting IV and giving IV medicines, antibiotics.  Dressing, post op shoe, crutches ordered.   Patient will be handed off to oncoming ED PA who will discharge after IV medicines and fluids.  Lactic acid has been collected prior to IV fluids.  Anticipate lactic acid will be trending down.  Patient is hemodynamically stable.  He does not meet SIRS criteria.  I explained to him plan to discharge with clindamycin, NSAIDs, oxycodone and warm compresses with wound recheck in 48 to 72 hours by PCP.  He is to come back to the ER or urgent care for recheck if unable to follow-up.  Discussed return precautions.  Patient and partner are in agreement.  Final Clinical Impression(s) / ED Diagnoses Final diagnoses:  Foot abscess  Abscess or cellulitis of foot    Rx / DC Orders ED Discharge Orders         Ordered    clindamycin (CLEOCIN) 150 MG capsule  3 times daily     05/29/20 2041    oxyCODONE (OXY IR/ROXICODONE) 5 MG immediate release tablet  Every 6 hours PRN     05/29/20 2041             Kinnie Feil, PA-C 05/29/20 2045    Drenda Freeze, MD 05/29/20 2120    Kinnie Feil, PA-C 05/31/20 1308    Drenda Freeze, MD 05/31/20 [phone]

## 2020-05-29 NOTE — Discharge Instructions (Signed)
You have an abscess in your foot with surrounding cellulitis. This is a collection of pus and skin infection.  X-ray did not show any deep infection, foreign body or involvement of bone.  Your white count and lactic acid were a little elevated today but You received IV fluids and IV antibiotics here. Incision and drainage was performed. We sent wound culture to test for MRSA.  You are ok to be discharged with oral antibiotics.   Take antibiotic as prescribed and until completed. Symptoms typically improve in 48-72 hours. You need to have wound check in 48-72 hours.  Contact your primary care doctor, podiatry to schedule follow up. Return to ER or Urgent care for wound check if unable to follow up with your primary care or podiatry doctors.  Apply moist heat (warm towel, heating pad) or massage under warm water at least twice a day to help drainage.   Any abscess can worsen, enlarge and spread infection into blood stream.  Return to the ER if you have fevers, chills, worsening swelling, redness, warmth despite antibiotics

## 2020-05-29 NOTE — ED Triage Notes (Signed)
Patient complains of ongoing right foot pain and swelling worse with ambulation. To see foot doctor later in month-denies trauma

## 2020-05-29 NOTE — Progress Notes (Signed)
Orthopedic Tech Progress Note Patient Details:  Ray [name] [birth year] [id number]  Ortho Devices Type of Ortho Device: Crutches, Postop shoe/boot Ortho Device/Splint Location: rle Ortho Device/Splint Interventions: Ordered, Application, Adjustment   Post Interventions Patient Tolerated: Well Instructions Provided: Care of device, Adjustment of device   Trinna Post 05/29/2020, 9:41 PM

## 2020-05-29 NOTE — ED Notes (Signed)
Critical lactic 2.2

## 2020-06-01 LAB — AEROBIC CULTURE W GRAM STAIN (SUPERFICIAL SPECIMEN): Culture: NORMAL

## 2020-06-02 ENCOUNTER — Telehealth: Payer: Self-pay

## 2020-06-02 NOTE — Telephone Encounter (Signed)
Post ED Visit - Positive Culture Follow-up: Unsuccessful Patient Follow-up  Culture assessed and recommendations reviewed by:  []  , Pharm.D. []  Enzo Bi, Pharm.D., BCPS AQ-ID []  , Pharm.D., BCPS []  Celedonio Miyamoto, Pharm.D., BCPS []  Lawrence, Garvin Fila.D., BCPS, AAHIVP []  , Pharm.D., BCPS, AAHIVP []  Georgina Pillion, PharmD []  , PharmD, BCPS [place] Pharm D Positive aerobic culture  []  Patient discharged without antimicrobial prescription and treatment is now indicated [x]  Organism is resistant to prescribed ED discharge antimicrobial []  Patient with positive blood cultures Needs change to Augmentin 875 mg BID x 7 days  Per [address] [place]  Unable to contact patient after 3 attempts, letter will be sent to address on file  06/02/2020, 9:31 AM

## 2020-06-02 NOTE — Progress Notes (Signed)
ED Antimicrobial Stewardship Positive Culture Follow Up   Ray [name] is an 59 y.o. male who presented to [facility] on 05/29/2020 with a chief complaint of right foot pain after stepping on a toothpick.  Recent Results (from the past 720 hour(s))  Wound or Superficial Culture     Status: None   Collection Time: 05/29/20  8:25 PM   Specimen: Abscess; Wound  Result Value Ref Range Status   Specimen Description ABSCESS RIGHT FOOT  Final   Special Requests NONE  Final   Gram Stain   Final    RARE WBC PRESENT, PREDOMINANTLY PMN ABUNDANT GRAM NEGATIVE RODS MODERATE GRAM POSITIVE COCCI FEW GRAM POSITIVE RODS    Culture   Final    NORMAL SKIN FLORA FEW EIKENELLA CORRODENS Usually susceptible to penicillin and other beta lactam agents,quinolones,macrolides and tetracyclines. Performed at [facility], [address] [place], [place] [address]    Report Status 06/01/2020 FINAL  Final  Blood culture (routine x 2)     Status: None (Preliminary result)   Collection Time: 05/29/20  8:30 PM   Specimen: BLOOD  Result Value Ref Range Status   Specimen Description BLOOD LEFT ANTECUBITAL  Final   Special Requests   Final    BOTTLES DRAWN AEROBIC AND ANAEROBIC Blood Culture adequate volume   Culture   Final    NO GROWTH 4 DAYS Performed at [facility], [address], [place], [place] [address]    Report Status PENDING  Incomplete  Blood culture (routine x 2)     Status: None (Preliminary result)   Collection Time: 05/29/20  8:30 PM   Specimen: BLOOD  Result Value Ref Range Status   Specimen Description BLOOD RIGHT ANTECUBITAL  Final   Special Requests   Final    BOTTLES DRAWN AEROBIC AND ANAEROBIC Blood Culture results may not be optimal due to an excessive volume of blood received in culture bottles   Culture   Final    NO GROWTH 4 DAYS Performed at [facility], [address] [place], [place] [address]    Report Status PENDING  Incomplete    [x]  Treated with  Clindamycin, organism resistant to prescribed antimicrobial  New antibiotic prescription: Augmentin 875 mg PO twice daily for 7 days  ED Provider: , PA-C  Graciella Freer, PharmD PGY1 Ambulatory Care Resident Monday - Friday phone -  [phone] Saturday - Sunday phone - [phone]

## 2020-06-03 LAB — CULTURE, BLOOD (ROUTINE X 2)
Culture: NO GROWTH
Culture: NO GROWTH
Special Requests: ADEQUATE

## 2020-06-15 ENCOUNTER — Ambulatory Visit: Payer: 59 | Admitting: Podiatry

## 2020-06-19 ENCOUNTER — Emergency Department (HOSPITAL_COMMUNITY): Payer: PRIVATE HEALTH INSURANCE

## 2020-06-19 ENCOUNTER — Emergency Department (HOSPITAL_COMMUNITY)
Admission: EM | Admit: 2020-06-19 | Discharge: 2020-06-19 | Disposition: A | Discharge status: Home / Self Care | Payer: PRIVATE HEALTH INSURANCE | Attending: Emergency Medicine | Admitting: Emergency Medicine

## 2020-06-19 ENCOUNTER — Encounter (HOSPITAL_COMMUNITY): Payer: Self-pay | Admitting: Emergency Medicine

## 2020-06-19 ENCOUNTER — Other Ambulatory Visit: Payer: Self-pay

## 2020-06-19 DIAGNOSIS — Z79899 Other long term (current) drug therapy: Secondary | ICD-10-CM | POA: Diagnosis not present

## 2020-06-19 DIAGNOSIS — Y999 Unspecified external cause status: Secondary | ICD-10-CM | POA: Insufficient documentation

## 2020-06-19 DIAGNOSIS — M79671 Pain in right foot: Secondary | ICD-10-CM | POA: Insufficient documentation

## 2020-06-19 DIAGNOSIS — M542 Cervicalgia: Secondary | ICD-10-CM | POA: Insufficient documentation

## 2020-06-19 DIAGNOSIS — Y9289 Other specified places as the place of occurrence of the external cause: Secondary | ICD-10-CM | POA: Diagnosis not present

## 2020-06-19 DIAGNOSIS — Z87891 Personal history of nicotine dependence: Secondary | ICD-10-CM | POA: Insufficient documentation

## 2020-06-19 DIAGNOSIS — Y9389 Activity, other specified: Secondary | ICD-10-CM | POA: Diagnosis not present

## 2020-06-19 DIAGNOSIS — I10 Essential (primary) hypertension: Secondary | ICD-10-CM | POA: Insufficient documentation

## 2020-06-19 DIAGNOSIS — R519 Headache, unspecified: Secondary | ICD-10-CM | POA: Insufficient documentation

## 2020-06-19 DIAGNOSIS — S96911A Strain of unspecified muscle and tendon at ankle and foot level, right foot, initial encounter: Secondary | ICD-10-CM

## 2020-06-19 MED ORDER — OXYCODONE-ACETAMINOPHEN 5-325 MG PO TABS
1.0000 | ORAL_TABLET | Freq: Once | ORAL | Status: AC
  Administered 2020-06-19: 1 via ORAL
  Filled 2020-06-19: qty 1

## 2020-06-19 NOTE — ED Notes (Signed)
Ace wrap applied to right foot and ankle per MD request.
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
## 2020-06-19 NOTE — Discharge Instructions (Addendum)
Recommend Tylenol Motrin as needed for pain control.  Recommend return to ER if you develop chest pain, difficulty breathing or other new concerning symptom.

## 2020-11-10 IMAGING — MR MR SHOULDER*L* W/O CM
4 of 5 series · 28 of 40 positions shown · non-contrast
Comparison: Shoulder radiographs 02/16/2020

CLINICAL DATA: [DATE] month history of knee pain.

EXAM:
MRI OF THE LEFT SHOULDER WITHOUT CONTRAST
TECHNIQUE: Multiplanar, multisequence MR imaging of the shoulder was performed.
No intravenous contrast was administered.

[Series 4: PD fat-sat · axial · 4.0mm · 0.27mm/px · z∈[-18,+68]mm · 8 of 19 slices shown (1 of 2)]
[im 1/19]
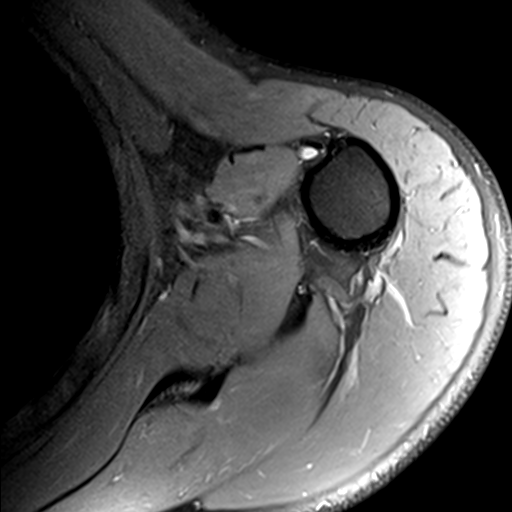
[im 3/19]
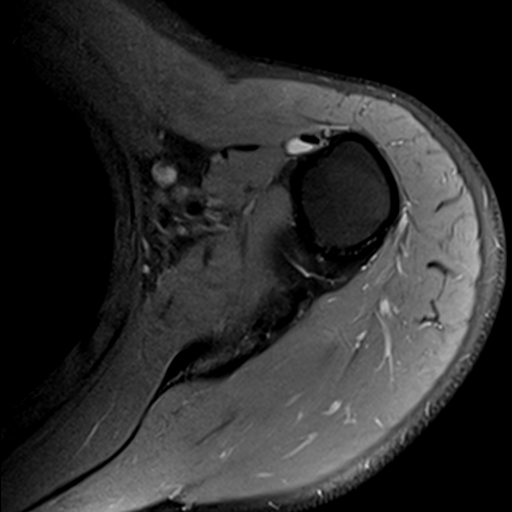
[im 6/19]
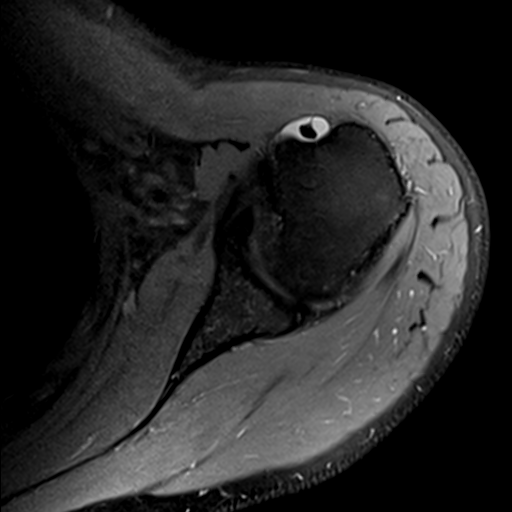
[im 8/19]
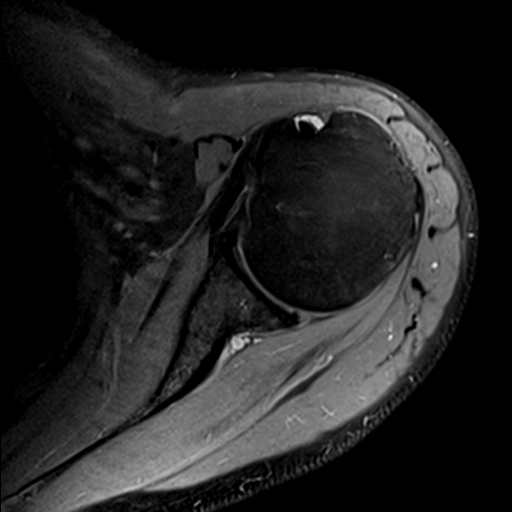
[im 11/19]
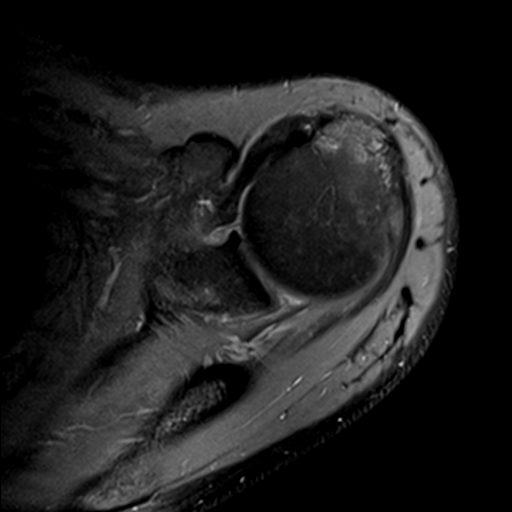
[im 13/19]
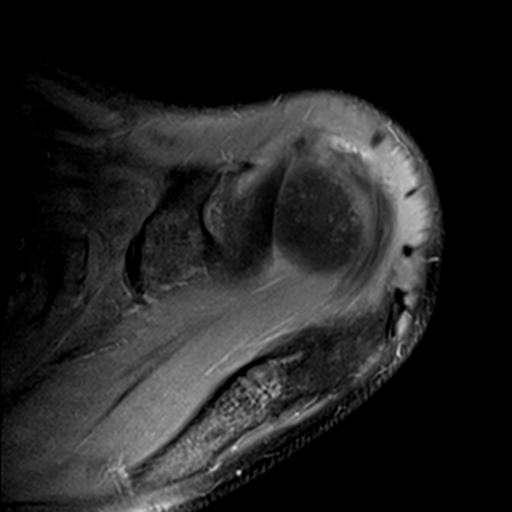
[im 16/19]
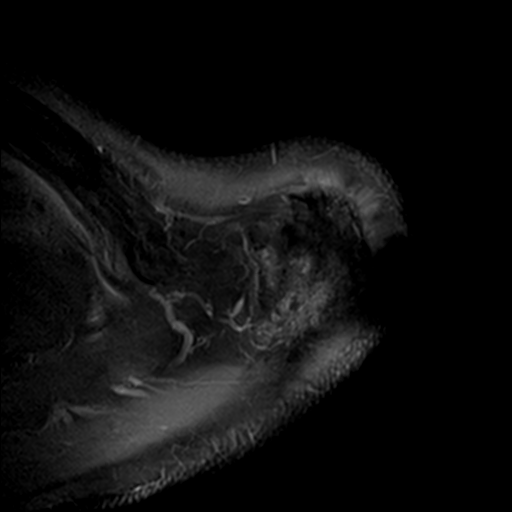
[im 19/19]
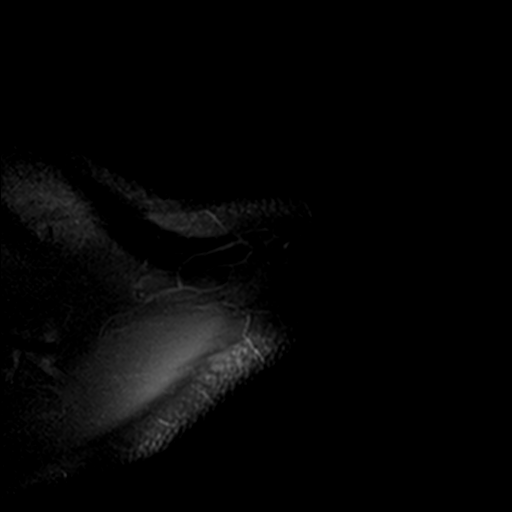

[Series 5: T2 fat-sat · oblique · 4.0mm · 0.55mm/px · 7 of 15 slices shown (1 of 2)]
[im 1/15]
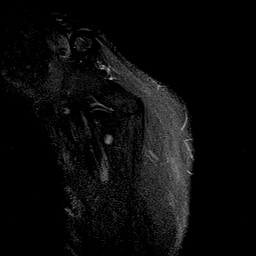
[im 3/15]
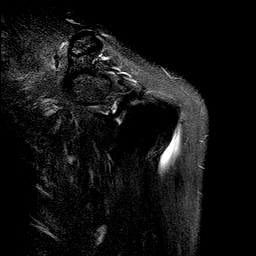
[im 5/15]
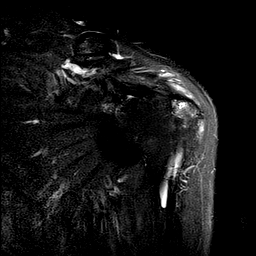
[im 8/15]
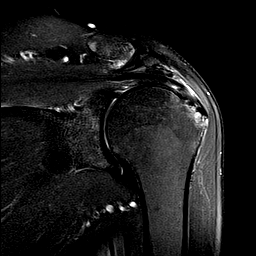
[im 10/15]
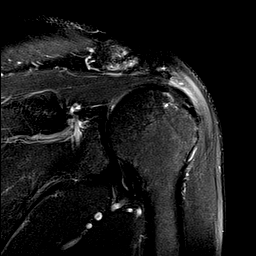
[im 12/15]
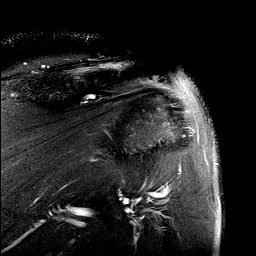
[im 15/15]
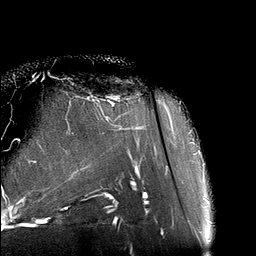

[Series 6: PD fat-sat · oblique · 4.0mm · 0.27mm/px · 7 of 15 slices shown (2 of 2)]
[im 1/15]
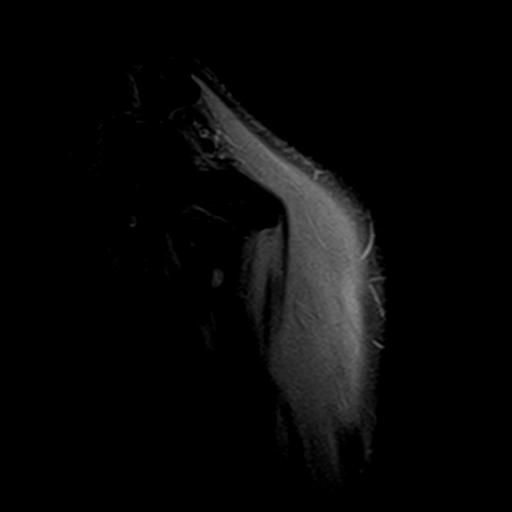
[im 3/15]
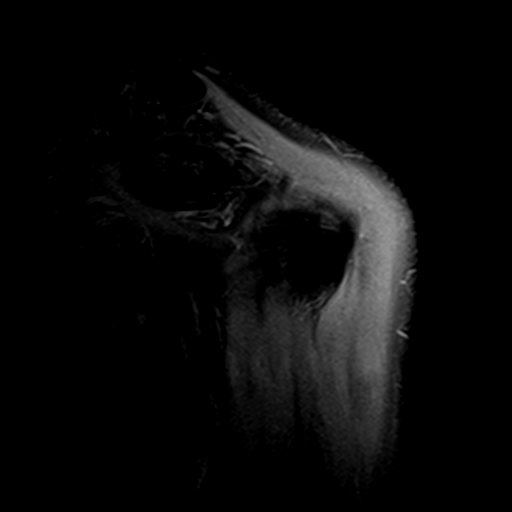
[im 5/15]
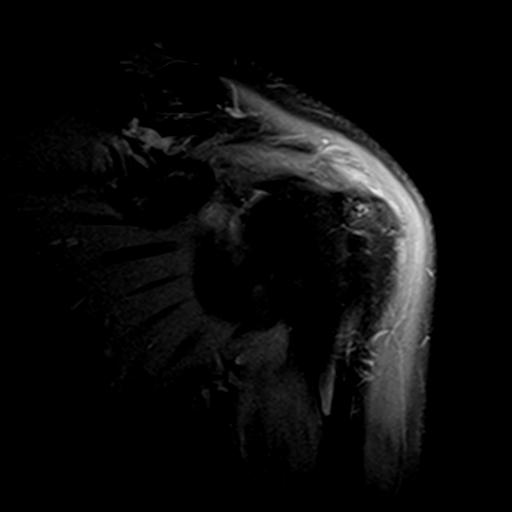
[im 8/15]
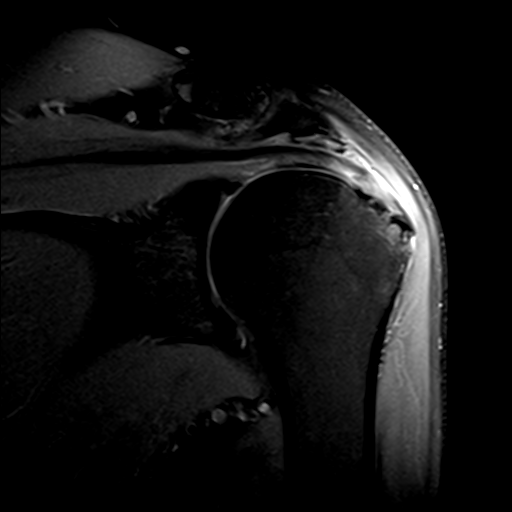
[im 10/15]
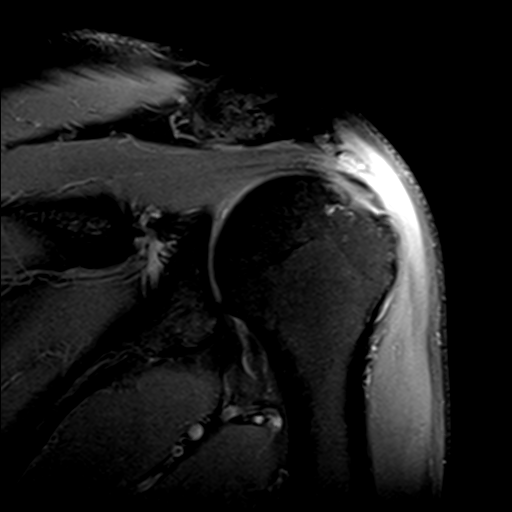
[im 12/15]
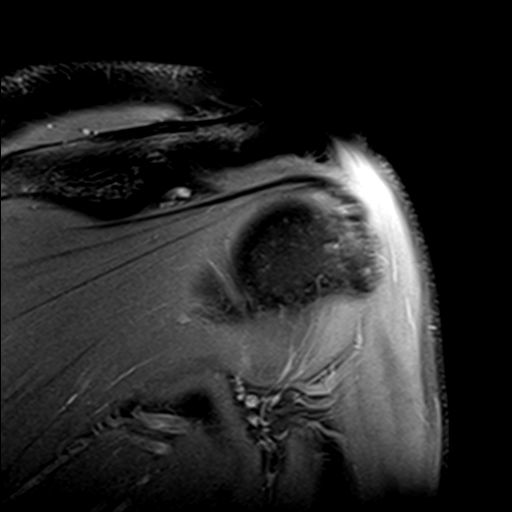
[im 15/15]
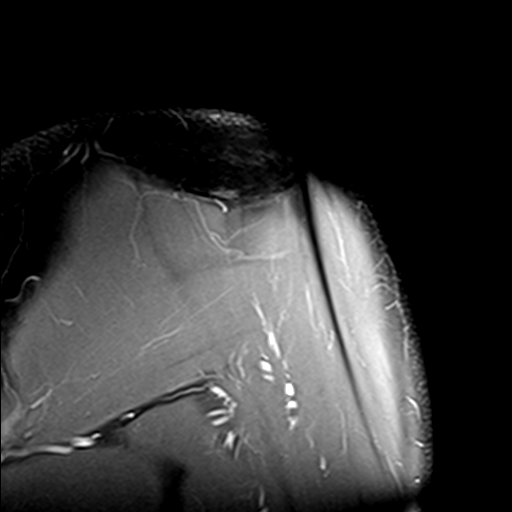

[Series 7: T2 fat-sat · oblique · 4.0mm · 0.55mm/px · 6 of 19 slices shown (2 of 2)]
[im 1/19]
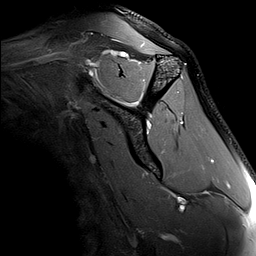
[im 3/19]
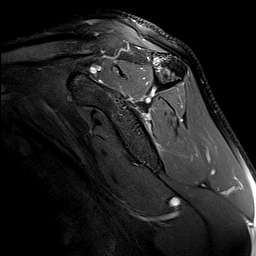
[im 5/19]
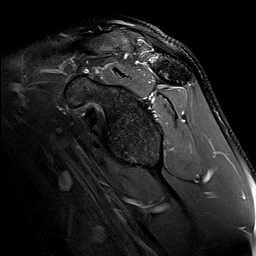
[im 7/19]
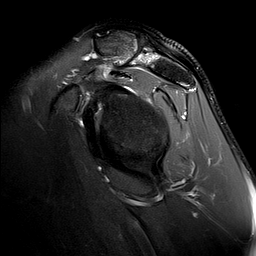
[im 10/19]
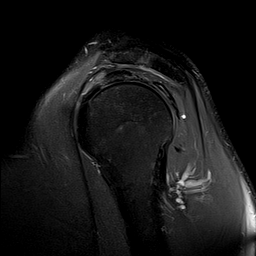
[im 16/19]
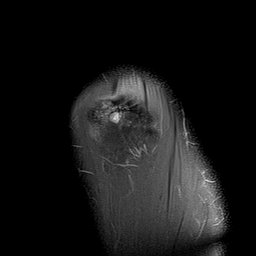

[28 of 40 positions shown; findings below may reference images not displayed]

FINDINGS: Rotator cuff: Significant supraspinatus tendinopathy/tendinosis with
interstitial tears. There is a small full-thickness retracted tear
involving the anterior attachment fibers. Maximum retraction is 12
mm and the tear is 8 mm wide. Mild infraspinatus and subscapularis
tendinopathy but no tears.

Muscles:  Unremarkable.

Biceps long head:  Intact

Acromioclavicular Joint: Moderate degenerative changes with inferior
spurring and mild mass effect on the supraspinatus muscle.

Type 2-3 acromion. No significant lateral downsloping. Mild
undersurface spurring.

Glenohumeral Joint: Mild degenerative changes with degenerative
chondrosis and cartilage thinning. No joint effusion. There is
thickening of the capsular structures in the axillary recess which
can be seen with adhesive capsulitis or synovitis.

Labrum:  Degenerated and torn posterior labrum.

Bones:  No acute bony findings.

Other: Expected fluid in the subacromial/subdeltoid bursa.
IMPRESSION: 1. Significant supraspinatus tendinopathy/tendinosis with a small
full-thickness retracted tear involving the anterior attachment
fibers.
2. Intact long head biceps tendon.
3. Degenerated and torn posterior labrum.
4. Moderate AC joint degenerative changes, type 2-3 acromion and
undersurface spurring may contribute to bony impingement.
5. Mild glenohumeral joint degenerative changes.
6. There is thickening of the capsular structures in the axillary
recess which can be seen with adhesive capsulitis or synovitis.

## 2021-03-14 ENCOUNTER — Other Ambulatory Visit: Payer: Self-pay | Admitting: Gastroenterology

## 2021-06-13 ENCOUNTER — Encounter (HOSPITAL_COMMUNITY): Payer: Self-pay | Admitting: Student

## 2021-06-13 ENCOUNTER — Observation Stay (HOSPITAL_COMMUNITY)
Admission: EM | Admit: 2021-06-13 | Discharge: 2021-06-15 | Disposition: A | Discharge status: Home / Self Care | Payer: PRIVATE HEALTH INSURANCE | Attending: Emergency Medicine | Admitting: Emergency Medicine

## 2021-06-13 ENCOUNTER — Emergency Department (HOSPITAL_COMMUNITY): Payer: PRIVATE HEALTH INSURANCE

## 2021-06-13 DIAGNOSIS — J9601 Acute respiratory failure with hypoxia: Secondary | ICD-10-CM | POA: Diagnosis not present

## 2021-06-13 DIAGNOSIS — J9311 Primary spontaneous pneumothorax: Secondary | ICD-10-CM | POA: Diagnosis not present

## 2021-06-13 DIAGNOSIS — Z79899 Other long term (current) drug therapy: Secondary | ICD-10-CM | POA: Diagnosis not present

## 2021-06-13 DIAGNOSIS — J439 Emphysema, unspecified: Secondary | ICD-10-CM | POA: Diagnosis present

## 2021-06-13 DIAGNOSIS — E876 Hypokalemia: Secondary | ICD-10-CM | POA: Diagnosis not present

## 2021-06-13 DIAGNOSIS — Z20822 Contact with and (suspected) exposure to covid-19: Secondary | ICD-10-CM | POA: Insufficient documentation

## 2021-06-13 DIAGNOSIS — J939 Pneumothorax, unspecified: Secondary | ICD-10-CM | POA: Diagnosis not present

## 2021-06-13 DIAGNOSIS — Z87891 Personal history of nicotine dependence: Secondary | ICD-10-CM | POA: Insufficient documentation

## 2021-06-13 DIAGNOSIS — E785 Hyperlipidemia, unspecified: Secondary | ICD-10-CM

## 2021-06-13 DIAGNOSIS — I10 Essential (primary) hypertension: Secondary | ICD-10-CM | POA: Diagnosis not present

## 2021-06-13 DIAGNOSIS — R0789 Other chest pain: Secondary | ICD-10-CM | POA: Diagnosis present

## 2021-06-13 LAB — CBC WITH DIFFERENTIAL/PLATELET
Abs Immature Granulocytes: 0.01 10*3/uL (ref 0.00–0.07)
Basophils Absolute: 0.1 10*3/uL (ref 0.0–0.1)
Basophils Relative: 1 %
Eosinophils Absolute: 0.1 10*3/uL (ref 0.0–0.5)
Eosinophils Relative: 1 %
HCT: 40.6 % (ref 39.0–52.0)
Hemoglobin: 13.9 g/dL (ref 13.0–17.0)
Immature Granulocytes: 0 %
Lymphocytes Relative: 10 %
Lymphs Abs: 0.5 10*3/uL — ABNORMAL LOW (ref 0.7–4.0)
MCH: 33.7 pg (ref 26.0–34.0)
MCHC: 34.2 g/dL (ref 30.0–36.0)
MCV: 98.3 fL (ref 80.0–100.0)
Monocytes Absolute: 0.4 10*3/uL (ref 0.1–1.0)
Monocytes Relative: 8 %
Neutro Abs: 4.3 10*3/uL (ref 1.7–7.7)
Neutrophils Relative %: 80 %
Platelets: 320 10*3/uL (ref 150–400)
RBC: 4.13 MIL/uL — ABNORMAL LOW (ref 4.22–5.81)
RDW: 13.2 % (ref 11.5–15.5)
WBC: 5.4 10*3/uL (ref 4.0–10.5)
nRBC: 0 % (ref 0.0–0.2)

## 2021-06-13 LAB — COMPREHENSIVE METABOLIC PANEL
ALT: 56 U/L — ABNORMAL HIGH (ref 0–44)
AST: 183 U/L — ABNORMAL HIGH (ref 15–41)
Albumin: 4.6 g/dL (ref 3.5–5.0)
Alkaline Phosphatase: 67 U/L (ref 38–126)
Anion gap: 11 (ref 5–15)
BUN: 12 mg/dL (ref 6–20)
CO2: 24 mmol/L (ref 22–32)
Calcium: 9.1 mg/dL (ref 8.9–10.3)
Chloride: 106 mmol/L (ref 98–111)
Creatinine, Ser: 0.85 mg/dL (ref 0.61–1.24)
GFR, Estimated: >60 mL/min (ref >60)
Glucose, Bld: 120 mg/dL — ABNORMAL HIGH (ref 70–99)
Potassium: 3.5 mmol/L (ref 3.5–5.1)
Sodium: 141 mmol/L (ref 135–145)
Total Bilirubin: 0.6 mg/dL (ref 0.3–1.2)
Total Protein: 8 g/dL (ref 6.5–8.1)

## 2021-06-13 LAB — RESP PANEL BY RT-PCR (FLU A&B, COVID) ARPGX2
Influenza A by PCR: NEGATIVE
Influenza B by PCR: NEGATIVE
SARS Coronavirus 2 by RT PCR: NEGATIVE

## 2021-06-13 LAB — TROPONIN I (HIGH SENSITIVITY)
Troponin I (High Sensitivity): 4 ng/L (ref <18)
Troponin I (High Sensitivity): 4 ng/L (ref <18)

## 2021-06-13 LAB — LIPASE, BLOOD: Lipase: 59 U/L — ABNORMAL HIGH (ref 11–51)

## 2021-06-13 MED ORDER — NITROGLYCERIN 0.4 MG SL SUBL
0.4000 mg | SUBLINGUAL_TABLET | SUBLINGUAL | Status: DC | PRN
  Administered 2021-06-13: 0.4 mg via SUBLINGUAL
  Filled 2021-06-13: qty 1

## 2021-06-13 MED ORDER — SODIUM CHLORIDE 0.9 % IV SOLN: 250.0000 mL | INTRAVENOUS | Status: DC | PRN

## 2021-06-13 MED ORDER — AMLODIPINE BESYLATE 10 MG PO TABS
10.0000 mg | ORAL_TABLET | Freq: Every day | ORAL | Status: DC
  Administered 2021-06-14 – 2021-06-15 (×2): 10 mg via ORAL
  Filled 2021-06-13 (×2): qty 1

## 2021-06-13 MED ORDER — SODIUM CHLORIDE 0.9% FLUSH
3.0000 mL | Freq: Two times a day (BID) | INTRAVENOUS | Status: DC
  Administered 2021-06-13 – 2021-06-15 (×4): 3 mL via INTRAVENOUS

## 2021-06-13 MED ORDER — ONDANSETRON HCL 4 MG/2ML IJ SOLN
4.0000 mg | Freq: Once | INTRAMUSCULAR | Status: AC
  Administered 2021-06-13: 4 mg via INTRAVENOUS
  Filled 2021-06-13: qty 2

## 2021-06-13 MED ORDER — HYDROMORPHONE HCL 1 MG/ML IJ SOLN: 1.0000 mg | INTRAMUSCULAR | Status: DC | PRN

## 2021-06-13 MED ORDER — SODIUM CHLORIDE 0.9% FLUSH: 3.0000 mL | INTRAVENOUS | Status: DC | PRN

## 2021-06-13 MED ORDER — ACETAMINOPHEN 325 MG PO TABS
650.0000 mg | ORAL_TABLET | Freq: Four times a day (QID) | ORAL | Status: DC | PRN
  Administered 2021-06-14: 650 mg via ORAL
  Filled 2021-06-13: qty 2

## 2021-06-13 MED ORDER — MORPHINE SULFATE (PF) 4 MG/ML IV SOLN
4.0000 mg | Freq: Once | INTRAVENOUS | Status: AC
  Administered 2021-06-13: 4 mg via INTRAVENOUS
  Filled 2021-06-13: qty 1

## 2021-06-13 MED ORDER — ASPIRIN 81 MG PO CHEW
324.0000 mg | CHEWABLE_TABLET | Freq: Once | ORAL | Status: AC
  Administered 2021-06-13: 324 mg via ORAL
  Filled 2021-06-13: qty 4

## 2021-06-13 MED ORDER — AMLODIPINE BESYLATE 5 MG PO TABS
10.0000 mg | ORAL_TABLET | Freq: Every day | ORAL | Status: DC
  Filled 2021-06-13: qty 2

## 2021-06-13 MED ORDER — TRAZODONE HCL 50 MG PO TABS: 50.0000 mg | ORAL_TABLET | Freq: Every evening | ORAL | Status: DC | PRN

## 2021-06-13 MED ORDER — ACETAMINOPHEN 650 MG RE SUPP: 650.0000 mg | Freq: Four times a day (QID) | RECTAL | Status: DC | PRN

## 2021-06-13 MED ORDER — IOHEXOL 350 MG/ML SOLN
80.0000 mL | Freq: Once | INTRAVENOUS | Status: AC | PRN
  Administered 2021-06-13: 80 mL via INTRAVENOUS

## 2021-06-13 MED ORDER — HYDROMORPHONE HCL 1 MG/ML IJ SOLN
1.0000 mg | Freq: Once | INTRAMUSCULAR | Status: AC
  Administered 2021-06-13: 1 mg via INTRAVENOUS
  Filled 2021-06-13: qty 1

## 2021-06-13 MED ORDER — PANTOPRAZOLE SODIUM 40 MG PO TBEC
40.0000 mg | DELAYED_RELEASE_TABLET | Freq: Every day | ORAL | Status: DC
  Administered 2021-06-13 – 2021-06-15 (×3): 40 mg via ORAL
  Filled 2021-06-13 (×3): qty 1

## 2021-06-13 MED ORDER — SODIUM CHLORIDE (PF) 0.9 % IJ SOLN
INTRAMUSCULAR | Status: AC
  Filled 2021-06-13: qty 50

## 2021-06-13 MED ORDER — FENTANYL CITRATE (PF) 100 MCG/2ML IJ SOLN
50.0000 ug | Freq: Once | INTRAMUSCULAR | Status: AC
  Administered 2021-06-13: 50 ug via INTRAVENOUS
  Filled 2021-06-13: qty 2

## 2021-06-13 MED ORDER — SODIUM CHLORIDE 0.9 % IV BOLUS
500.0000 mL | Freq: Once | INTRAVENOUS | Status: AC
  Administered 2021-06-13: 500 mL via INTRAVENOUS

## 2021-06-13 MED ORDER — ATORVASTATIN CALCIUM 10 MG PO TABS
20.0000 mg | ORAL_TABLET | Freq: Every day | ORAL | Status: DC
  Administered 2021-06-13 – 2021-06-15 (×3): 20 mg via ORAL
  Filled 2021-06-13 (×3): qty 2

## 2021-06-13 NOTE — H&P (Signed)
History and Physical    Kimoni Pagliarulo DGU:[id number] DOB: 1961/03/09 DOA: 06/13/2021  PCP: Knox Royalty, MD   Patient coming from: Home  Chief Complaint: SOB, right upper back and chest pain  HPI: Ray [name] is a 60 y.o. 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 Presents with complaint of right sided upper chest pain, pain under right shoulder blade and SOB that started around 1 pm today when at work. 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 He suddenly developed a chest pressure in right upper chest and under right shoulder blade. The pressure worsened with deep inspiration and movement of upper body. Nothing helped relieve the pain. He states it felt similar to previous pneumothorax he has had in past. He reports associated diaphoresis and nausea with lightheadedness.  He did not have any syncope or vomiting.  Reports he had a mild dry cough over the last few days which is not uncommon for him when the seasons change he states.  He has not had any fever, chills, hemoptysis, leg pain or swelling, denies any recent prolonged immobilization or travel.  He states he has no history of DVT or PEs.  ED Course: Mr. Ruffolo has been tachycardic and tachypneic in the emergency room chest x-ray was clear but with his continued symptoms ER provider obtained a CT angiography to make sure there is no pulmonary embolism.  CTA showed 15% right-sided upper lung pneumothorax.  ER provider discussed with cardiothoracic surgery who recommended patient be observed overnight at [facility] and they would see in the morning.  They requested patient go to [facility] in case a procedure intervention is required in the morning if pneumothorax does not improve.  Patient was placed on supplemental oxygen.  CBC was unremarkable.  Lipase was 59.  AST 183, ALT 56, alkaline phosphate 67.  Sodium  141, potassium 3.5, creatinine 0.85, BUN 12.  Troponin was 4 2 occasions in the ER.  Hospitalist service was asked to place patient observation for further management  Review of Systems:  General: Reports light headedness, Denies fever, chills, weight loss, night sweats. Denies change in appetite HENT: Denies head trauma, headache, denies change in hearing, tinnitus.  Denies nasal congestion.  Denies sore throat.  Denies difficulty swallowing Eyes: Denies blurry vision, pain in eye, drainage.  Denies discoloration of eyes. Neck: Denies pain.  Denies swelling.  Denies pain with movement. Cardiovascular: Reports right upper chest pain. Denies palpitations.  Denies edema.  Denies orthopnea Respiratory: Reports shortness of breath. Denies cough.  Denies wheezing.  Denies sputum production Gastrointestinal: Reports mild nausea. Denies abdominal pain, swelling.  Denies vomiting, diarrhea.  Denies melena.  Denies hematemesis. Musculoskeletal: Denies limitation of movement.  Denies deformity or swelling. Denies arthralgias or myalgias. Genitourinary: Denies pelvic pain.  Denies urinary frequency or hesitancy.  Denies dysuria.  Skin: Denies rash.  Denies petechiae, purpura, ecchymosis. Neurological: Denies syncope. Denies seizure activity. Denies paresthesia. Denies slurred speech, drooping face.  Denies visual change. Psychiatric: Denies depression, anxiety. Denies hallucinations.  Past Medical History:  Diagnosis Date   Atelectasis    r/side   Hypertension     Past Surgical History:  Procedure Laterality Date   COLONOSCOPY     done at age 59 per pt and was told to have it in 10 years again   STAPLING OF BLEBS Right 12/20/2017   Procedure: STAPLING OF BLEBS;  Surgeon: Delight Ovens, MD;  Location: [facility] OR;  Service: Thoracic;  Laterality: Right;   STAPLING OF BLEBS Left 11/10/2019   Procedure: STAPLING OF BLEBS;  Surgeon: Delight Ovens, MD;  Location: [facility] OR;  Service: Thoracic;   Laterality: Left;   VIDEO ASSISTED THORACOSCOPY Right 12/20/2017   Procedure: VIDEO ASSISTED THORACOSCOPY;  Surgeon: Delight Ovens, MD;  Location: [facility] OR;  Service: Thoracic;  Laterality: Right;   VIDEO ASSISTED THORACOSCOPY Left 11/10/2019   Procedure: VIDEO ASSISTED THORACOSCOPY;  Surgeon: Delight Ovens, MD;  Location: [facility] OR;  Service: Thoracic;  Laterality: Left;   VIDEO BRONCHOSCOPY N/A 12/20/2017   Procedure: VIDEO BRONCHOSCOPY;  Surgeon: Delight Ovens, MD;  Location: [facility] OR;  Service: Thoracic;  Laterality: N/A;   VIDEO BRONCHOSCOPY N/A 11/10/2019   Procedure: VIDEO BRONCHOSCOPY;  Surgeon: Delight Ovens, MD;  Location: [facility] OR;  Service: Thoracic;  Laterality: N/A;    Social History  reports that he has quit smoking. He has never used smokeless tobacco. He reports current alcohol use. He reports current drug use. Drug: Marijuana.  Allergies  Allergen Reactions   Asa [Aspirin] Nausea Only   Lisinopril Swelling    Lip/Tongue swelling     Family History  Problem Relation Age of Onset   Hypertension Mother    Colon cancer Neg Hx    Esophageal cancer Neg Hx      Prior to Admission medications   Medication Sig Start Date End Date Taking? Authorizing Provider  amLODipine (NORVASC) 10 MG tablet Take 1 tablet (10 mg total) by mouth daily. 11/13/19  Yes Noralee Stain, DO  atorvastatin (LIPITOR) 20 MG tablet Take 20 mg by mouth daily. 03/15/21  Yes [provider]  pantoprazole (PROTONIX) 40 MG tablet Take 1 tablet (40 mg total) by mouth daily. Please call [phone] to schedule an office visit for more refills. Patient taking differently: Take 40 mg by mouth daily. 03/14/21  Yes Lynann Bologna, MD  traZODone (DESYREL) 50 MG tablet Take 50 mg by mouth at bedtime as needed for sleep.  01/29/17  Yes [provider]  Vitamin D, Ergocalciferol, (DRISDOL) 1.25 MG (50000 UNIT) CAPS capsule Take 50,000 Units by mouth once a week. 01/14/21  Yes [provider]  bisoprolol-hydrochlorothiazide (ZIAC) 10-6.25 MG per tablet Take 1 tablet by mouth daily. Patient not taking: Reported on 06/13/2021 12/23/13   Nelva Nay, MD  ondansetron (ZOFRAN ODT) 4 MG disintegrating tablet Take 1 tablet (4 mg total) by mouth every 8 (eight) hours as needed for nausea or vomiting. Patient not taking: No sig reported 03/02/20   Lynann Bologna, MD  potassium chloride SA (KLOR-CON) 20 MEQ tablet Take 2 tablets (40 mEq total) by mouth daily for 7 doses. Patient not taking: Reported on 06/13/2021 03/04/20 03/11/20  Lynann Bologna, MD    Physical Exam: Vitals:   06/13/21 1900 06/13/21 1915 06/13/21 1930 06/13/21 1945  BP: (!) 172/103 (!) 168/87 (!) 165/86 (!) 168/88  Pulse: (!) 120 (!) 116 (!) 115 (!) 115  Resp:  18  18  Temp:      TempSrc:      SpO2: 100% 96% 100% 100%  Weight:      Height:        Constitutional: NAD, calm, comfortable Vitals:   06/13/21 1900 06/13/21 1915 06/13/21 1930 06/13/21 1945  BP: (!) 172/103 (!) 168/87 (!) 165/86 (!) 168/88  Pulse: (!) 120 (!) 116 (!) 115 (!) 115  Resp:  18  18  Temp:  TempSrc:      SpO2: 100% 96% 100% 100%  Weight:      Height:       General: WDWN, Alert and oriented x3.  Eyes: EOMI, PERRL, conjunctivae normal. Sclera nonicteric HENT:  [place]/AT, external ears normal.  Nares patent without epistasis.  Mucous membranes are moist.  Neck: Soft, normal range of motion, supple, no masses,Trachea midline Respiratory: Diminished breath sounds in right upper lung field, all other lung fields clear. no wheezing, no crackles. Normal respiratory effort. No accessory muscle use.  Cardiovascular: Sinus tachycardia rhythm, no murmurs / rubs / gallops. No extremity edema. 2+ pedal pulses.  Abdomen: Soft, no tenderness, nondistended, no rebound or guarding.  No masses palpated. Bowel sounds normoactive Musculoskeletal: FROM. no cyanosis. No joint deformity upper and lower extremities. Normal muscle tone.  Skin: Warm,  dry, intact no rashes, lesions, ulcers. No induration Neurologic: CN 2-12 grossly intact.  Normal speech.  Sensation intact,  Strength 5/5 in all extremities.   Psychiatric: Normal judgment and insight.  Normal mood.    Labs on Admission: I have personally reviewed following labs and imaging studies  CBC: Recent Labs  Lab 06/13/21 1614  WBC 5.4  NEUTROABS 4.3  HGB 13.9  HCT 40.6  MCV 98.3  PLT 320    Basic Metabolic Panel: Recent Labs  Lab 06/13/21 1614  NA 141  K 3.5  CL 106  CO2 24  GLUCOSE 120*  BUN 12  CREATININE 0.85  CALCIUM 9.1    GFR: Estimated Creatinine Clearance: 95.3 mL/min (by C-G formula based on SCr of 0.85 mg/dL).  Liver Function Tests: Recent Labs  Lab 06/13/21 1614  AST 183*  ALT 56*  ALKPHOS 67  BILITOT 0.6  PROT 8.0  ALBUMIN 4.6    Urine analysis:    Component Value Date/Time   COLORURINE YELLOW 11/10/2019 0121   APPEARANCEUR CLEAR 11/10/2019 0121   LABSPEC 1.016 11/10/2019 0121   PHURINE 6.0 11/10/2019 0121   GLUCOSEU 50 (A) 11/10/2019 0121   HGBUR NEGATIVE 11/10/2019 0121   BILIRUBINUR NEGATIVE 11/10/2019 0121   KETONESUR NEGATIVE 11/10/2019 0121   PROTEINUR NEGATIVE 11/10/2019 0121   UROBILINOGEN 0.2 12/23/2013 1646   NITRITE NEGATIVE 11/10/2019 0121   LEUKOCYTESUR NEGATIVE 11/10/2019 0121    Radiological Exams on Admission: CT Angio Chest PE W/Cm &/Or Wo Cm  Result Date: 06/13/2021 CLINICAL DATA:  60 year old male with concern for pulmonary embolism. EXAM: CT ANGIOGRAPHY CHEST WITH CONTRAST TECHNIQUE: Multidetector CT imaging of the chest was performed using the standard protocol during bolus administration of intravenous contrast. Multiplanar CT image reconstructions and MIPs were obtained to evaluate the vascular anatomy. CONTRAST:  80mL OMNIPAQUE IOHEXOL 350 MG/ML SOLN COMPARISON:  Chest CT dated 11/07/2019 and radiograph dated 06/13/2021. FINDINGS: Evaluation of this exam is limited due to respiratory motion artifact.  Cardiovascular: There is no cardiomegaly or pericardial effusion. The thoracic aorta is unremarkable. The origins of the great vessels of the aortic arch appear patent. Evaluation of the pulmonary arteries is limited due to respiratory motion artifact. No large central pulmonary artery embolus identified. Mediastinum/Nodes: There is no hilar or mediastinal adenopathy. The esophagus and the thyroid gland are grossly unremarkable. No mediastinal fluid collection. Lungs/Pleura: There is a right-sided pneumothorax measuring approximately 18 mm in thickness to the anterior pleural surface, likely 15%. No mediastinal shift. There is background of emphysema. Linear lucency at the right lung base, suspicious for a small pulmonary laceration. No lobar consolidation or pleural effusion. Biapical scarring and postsurgical changes. The  central airways are patent. Upper Abdomen: Fatty liver. Musculoskeletal: No acute osseous pathology. Review of the MIP images confirms the above findings. IMPRESSION: 1. Right-sided pneumothorax, likely 15%. 2. Emphysema and probable small pulmonary laceration at the right lung base. 3. No CT evidence of central pulmonary artery embolus. 4. Aortic Atherosclerosis (ICD10-I70.0) and Emphysema (ICD10-J43.9). These results were called by telephone at the time of interpretation on 06/13/2021 at 6:29 pm to provider [facility] , who verbally acknowledged these results. Electronically Signed   By: Elgie Collard M.D.   On: 06/13/2021 18:32   DG Chest Portable 1 View  Result Date: 06/13/2021 CLINICAL DATA:  Right-sided chest pain for several hours EXAM: PORTABLE CHEST 1 VIEW COMPARISON:  06/19/2020 FINDINGS: Cardiac shadow is stable. Aortic calcifications are noted. Postsurgical changes are seen in the right apex. No focal infiltrate, effusion or pneumothorax is seen. No bony abnormality is noted. IMPRESSION: No active disease. Electronically Signed   By: Alcide Clever M.D.   On: 06/13/2021  16:45    EKG: Independently reviewed.  EKG shows sinus tachycardia with heart rate of 126.  No acute ST elevation or depression.  QTc 441  Assessment/Plan Principal Problem:   Pneumothorax on right Mr. Cleland is placed on Medical Telemetry for observation. He is provided with supplemental oxygen with pneumothorax.  CT surgery consulted by ER provider and they recommended pt be sent to [facility] for observation overnight in case he will require a procedure to intervene and definitively treat the pneumothorax. CT surgery will see pt in am Pain control provided with Dilaudid overnight if needed for severe pain.   Active Problems:   Hypertension Continue Norvasc. Monitor BP.    Lung blebs  Has hx of Blebs and has had pneumothorax in past requiring chest tube placement.      DVT prophylaxis: Padua score low. Ambulation for DVT prophylaxis  Code Status:   Full code  Family Communication:  Diagnosis and plan discussed with patient.  Patient verbalized understanding agrees with plan.  Further recommendations to follow as clinically indicated Disposition Plan:   Patient is from:  Home  Anticipated DC to:  Home  Anticipated DC date:  Anticipate less than 2 midnight stay  Anticipated DC barriers: No barriers to discharge identified this time  Consults called:  CT surgery at [facility] consulted by ER provider  Admission status:  Observation   Carlton Adam MD [facility]  How to contact the [facility] Attending or Consulting provider 7A - 7P or covering provider during after hours 7P -7A, for this patient?   Check the care team in [facility] and look for a) attending/consulting [facility] provider listed and b) the [facility] team listed Log into [email or web address] and use [facility]'s universal password to access. If you do not have the password, please contact the hospital operator. Locate the [facility] provider you are looking for under [facility] and page to a number that you can be directly  reached. If you still have difficulty reaching the provider, please page the [facility] (Director on Call) for the Hospitalists listed on amion for assistance.  06/13/2021, 8:39 PM

## 2021-06-13 NOTE — ED Provider Notes (Signed)
Emergency Medicine Provider Triage Evaluation Note  Ray [name] , a 60 y.o. male  was evaluated in triage.  Pt complains of right-sided chest pain radiating to back. Prior hx tobacco use and hx of multiple collapsed lungs.   Sudden onset right-sided chest pain starting at 1 PM.  Radiated towards shoulder and back. Felt similar to prior.  Associated nausea, diaphoresis, and shortness of breath.  Review of Systems  Positive: Right-sided chest pain, SOB, nausea, diaphoresis, cough. Negative: Vomiting.  Physical Exam  BP (!) 164/86 (BP Location: Left Arm)   Pulse (!) 125   Temp 99 F (37.2 C) (Oral)   Resp (!) 21   SpO2 96%  Gen:   Awake, no distress  Resp:  Normal effort. Breath sounds intact bilaterally.  MSK:   Moves extremities without difficulty    Medical Decision Making  Medically screening exam initiated at 4:08 PM.  Appropriate orders placed.  Eufemio Strahm was informed that the remainder of the evaluation will be completed by another provider, this initial triage assessment does not replace that evaluation, and the importance of remaining in the ED until their evaluation is complete.  Concern for ACS, pneumothorax, PE.   Lorelee New, PA-C 06/13/21 1613    Gerhard Munch, MD 06/13/21 [id number]

## 2021-06-13 NOTE — ED Triage Notes (Signed)
Pt states he was at work and started having right sided chest pain about 3 hours ago.

## 2021-06-13 NOTE — ED Provider Notes (Signed)
[facility]-EMERGENCY DEPT Provider Note   CSN: [id number] Arrival date & time: 06/13/21  1550     History Chief Complaint  Patient presents with   Chest Pain     Ray [name] is a 60 y.o. male with a hx of hypertension, prior spontaneous pneumothoraces, & prior stapling of blebs  who presents to the ED with complaints of chest pain that started @ 1300 today. Pain is to the right side of the chest, feels like pressure, constant, worse with attempts to deep breathe and with movement, no alleviating factors. Associated dyspnea, diaphoresis, nausea, and lightheadedness. Feels somewhat like prior pneumothoraces. Has had mild dry cough over past few days. Denies fever, nausea, vomiting, syncope, hemoptysis, leg pain/swelling, recent surgery/trauma, recent long travel, hormone use, personal hx of cancer, or hx of DVT/PE.  HPI     Past Medical History:  Diagnosis Date   Atelectasis    r/side   Hypertension     Patient Active Problem List   Diagnosis Date Noted   Hypokalemia 11/06/2019   Lung blebs (HCC) 12/20/2017   Pneumothorax on right 12/19/2017   Pneumothorax 02/26/2017   Hypertension 02/26/2017   Nicotine dependence 02/26/2017   Spontaneous pneumothorax 02/26/2017    Past Surgical History:  Procedure Laterality Date   COLONOSCOPY     done at age 39 per pt and was told to have it in 10 years again   STAPLING OF BLEBS Right 12/20/2017   Procedure: STAPLING OF BLEBS;  Surgeon: Delight Ovens, MD;  Location: [facility] OR;  Service: Thoracic;  Laterality: Right;   STAPLING OF BLEBS Left 11/10/2019   Procedure: STAPLING OF BLEBS;  Surgeon: Delight Ovens, MD;  Location: [facility] OR;  Service: Thoracic;  Laterality: Left;   VIDEO ASSISTED THORACOSCOPY Right 12/20/2017   Procedure: VIDEO ASSISTED THORACOSCOPY;  Surgeon: Delight Ovens, MD;  Location: [facility] OR;  Service: Thoracic;  Laterality: Right;   VIDEO ASSISTED THORACOSCOPY Left 11/10/2019   Procedure:  VIDEO ASSISTED THORACOSCOPY;  Surgeon: Delight Ovens, MD;  Location: [facility] OR;  Service: Thoracic;  Laterality: Left;   VIDEO BRONCHOSCOPY N/A 12/20/2017   Procedure: VIDEO BRONCHOSCOPY;  Surgeon: Delight Ovens, MD;  Location: [facility] OR;  Service: Thoracic;  Laterality: N/A;   VIDEO BRONCHOSCOPY N/A 11/10/2019   Procedure: VIDEO BRONCHOSCOPY;  Surgeon: Delight Ovens, MD;  Location: [facility] OR;  Service: Thoracic;  Laterality: N/A;       Family History  Problem Relation Age of Onset   Hypertension Mother    Colon cancer Neg Hx    Esophageal cancer Neg Hx     Social History   Tobacco Use   Smoking status: Former    Pack years: 0.00   Smokeless tobacco: Never   Tobacco comments:    quit 20 years ago  Vaping Use   Vaping Use: Never used  Substance Use Topics   Alcohol use: Yes    Comment: pt reports 1 beer daily   Drug use: Yes    Types: Marijuana    Home Medications Prior to Admission medications   Medication Sig Start Date End Date Taking? Authorizing Provider  amLODipine (NORVASC) 10 MG tablet Take 1 tablet (10 mg total) by mouth daily. 11/13/19   Noralee Stain, DO  atorvastatin (LIPITOR) 10 MG tablet Take 10 mg by mouth at bedtime. 11/02/19   [provider]  bisoprolol-hydrochlorothiazide (ZIAC) 10-6.25 MG per tablet Take 1 tablet by mouth daily. 12/23/13   Nelva Nay, MD  losartan (COZAAR) 100 MG tablet Take 100 mg by mouth every morning. 02/16/17   [provider]  ondansetron (ZOFRAN ODT) 4 MG disintegrating tablet Take 1 tablet (4 mg total) by mouth every 8 (eight) hours as needed for nausea or vomiting. 03/02/20   Lynann Bologna, MD  pantoprazole (PROTONIX) 40 MG tablet Take 1 tablet (40 mg total) by mouth daily. Please call [phone] to schedule an office visit for more refills. 03/14/21   Lynann Bologna, MD  potassium chloride SA (KLOR-CON) 20 MEQ tablet Take 2 tablets (40 mEq total) by mouth daily for 7 doses. 03/04/20 03/11/20  Lynann Bologna,  MD  traZODone (DESYREL) 50 MG tablet Take 50 mg by mouth at bedtime as needed for sleep.  01/29/17   [provider]    Allergies    Asa [aspirin] and Lisinopril  Review of Systems   Review of Systems  Constitutional:  Positive for diaphoresis. Negative for chills and fever.  Respiratory:  Positive for shortness of breath.   Cardiovascular:  Positive for chest pain. Negative for leg swelling.  Gastrointestinal:  Positive for nausea. Negative for abdominal pain and vomiting.  Neurological:  Positive for light-headedness. Negative for syncope.  All other systems reviewed and are negative.  Physical Exam Updated Vital Signs BP (!) 164/86 (BP Location: Left Arm)   Pulse (!) 125   Temp 99 F (37.2 C) (Oral)   Resp (!) 21   SpO2 96%   Physical Exam Vitals and nursing note reviewed.  Constitutional:      General: He is not in acute distress.    Appearance: He is well-developed. He is not toxic-appearing.  HENT:     Head: Normocephalic and atraumatic.  Eyes:     General:        Right eye: No discharge.        Left eye: No discharge.     Conjunctiva/sclera: Conjunctivae normal.  Cardiovascular:     Rate and Rhythm: Regular rhythm. Tachycardia present.  Pulmonary:     Effort: Tachypnea (mild) present. No respiratory distress.     Breath sounds: Decreased air movement (right upper lobe) present. No wheezing, rhonchi or rales.  Abdominal:     General: There is no distension.     Palpations: Abdomen is soft.     Tenderness: There is no abdominal tenderness.  Musculoskeletal:     Cervical back: Neck supple.  Skin:    General: Skin is warm and dry.     Findings: No rash.  Neurological:     Mental Status: He is alert.     Comments: Clear speech.   Psychiatric:        Behavior: Behavior normal.    ED Results / Procedures / Treatments   Labs (all labs ordered are listed, but only abnormal results are displayed) Labs Reviewed  CBC WITH DIFFERENTIAL/PLATELET -  Abnormal; Notable for the following components:      Result Value   RBC 4.13 (*)    Lymphs Abs 0.5 (*)    All other components within normal limits  COMPREHENSIVE METABOLIC PANEL - Abnormal; Notable for the following components:   Glucose, Bld 120 (*)    AST 183 (*)    ALT 56 (*)    All other components within normal limits  LIPASE, BLOOD - Abnormal; Notable for the following components:   Lipase 59 (*)    All other components within normal limits  RESP PANEL BY RT-PCR (FLU A&B, COVID) ARPGX2  TROPONIN I (HIGH  SENSITIVITY)  TROPONIN I (HIGH SENSITIVITY)    EKG None  Radiology CT Angio Chest PE W/Cm &/Or Wo Cm  Result Date: 06/13/2021 CLINICAL DATA:  60 year old male with concern for pulmonary embolism. EXAM: CT ANGIOGRAPHY CHEST WITH CONTRAST TECHNIQUE: Multidetector CT imaging of the chest was performed using the standard protocol during bolus administration of intravenous contrast. Multiplanar CT image reconstructions and MIPs were obtained to evaluate the vascular anatomy. CONTRAST:  54mL OMNIPAQUE IOHEXOL 350 MG/ML SOLN COMPARISON:  Chest CT dated 11/07/2019 and radiograph dated 06/13/2021. FINDINGS: Evaluation of this exam is limited due to respiratory motion artifact. Cardiovascular: There is no cardiomegaly or pericardial effusion. The thoracic aorta is unremarkable. The origins of the great vessels of the aortic arch appear patent. Evaluation of the pulmonary arteries is limited due to respiratory motion artifact. No large central pulmonary artery embolus identified. Mediastinum/Nodes: There is no hilar or mediastinal adenopathy. The esophagus and the thyroid gland are grossly unremarkable. No mediastinal fluid collection. Lungs/Pleura: There is a right-sided pneumothorax measuring approximately 18 mm in thickness to the anterior pleural surface, likely 15%. No mediastinal shift. There is background of emphysema. Linear lucency at the right lung base, suspicious for a small pulmonary  laceration. No lobar consolidation or pleural effusion. Biapical scarring and postsurgical changes. The central airways are patent. Upper Abdomen: Fatty liver. Musculoskeletal: No acute osseous pathology. Review of the MIP images confirms the above findings. IMPRESSION: 1. Right-sided pneumothorax, likely 15%. 2. Emphysema and probable small pulmonary laceration at the right lung base. 3. No CT evidence of central pulmonary artery embolus. 4. Aortic Atherosclerosis (ICD10-I70.0) and Emphysema (ICD10-J43.9). These results were called by telephone at the time of interpretation on 06/13/2021 at 6:29 pm to provider [facility] , who verbally acknowledged these results. Electronically Signed   By: Elgie Collard M.D.   On: 06/13/2021 18:32   DG Chest Portable 1 View  Result Date: 06/13/2021 CLINICAL DATA:  Right-sided chest pain for several hours EXAM: PORTABLE CHEST 1 VIEW COMPARISON:  06/19/2020 FINDINGS: Cardiac shadow is stable. Aortic calcifications are noted. Postsurgical changes are seen in the right apex. No focal infiltrate, effusion or pneumothorax is seen. No bony abnormality is noted. IMPRESSION: No active disease. Electronically Signed   By: Alcide Clever M.D.   On: 06/13/2021 16:45    Procedures Procedures   Medications Ordered in ED Medications  aspirin chewable tablet 324 mg (324 mg Oral Given 06/13/21 1619)    ED Course  I have reviewed the triage vital signs and the nursing notes.  Pertinent labs & imaging results that were available during my care of the patient were reviewed by me and considered in my medical decision making (see chart for details).    MDM Rules/Calculators/A&P                          Patient presents to the ED with complaints of right sided chest pain that began earlier this afternoon. Tachycardic & tachypneic on arrival. Mild decreased breath sounds noted on the right. Not in respiratory distress at this time.   Additional history obtained:   Additional history obtained from chart review & nursing note review.  Admission in November 2020 for pneumothorax.  No prior echo/heart cath on record.   EKG: Sinus tachycardia, no STEMI.   Lab Tests:  I reviewed and interpreted labs, which included:  CBC: Unremarkable CMP: Mildly elevated LFTs. Lipase: Mildly elevated Troponin: Within normal limits  Imaging Studies ordered:  I ordered imaging studies which included chest x-ray and subsequent CT angio of the chest, I independently reviewed, formal radiology impression shows:  CXR: NO active disease.  CTA: 1. Right-sided pneumothorax, likely 15%. 2. Emphysema and probable small pulmonary laceration at the right lung base. 3. No CT evidence of central pulmonary artery embolus. 4. Aortic Atherosclerosis (ICD10-I70.0) and Emphysema (ICD10-J43.9).  ED Course:   18:32: CONSULT: Discussed with radiology- patient has right sided pneumothorax, approximately 15%.   Patient placed on NRB, remains tachycardic, was not requiring supplemental O2 prior to initiation of NRB. Will discuss with CT surgery.   19:21: CONSULT: Discussed with cardiothoracic surgeon Dr. Cliffton AstersLightfoot- recommends supplemental O2, admit to hospitalist service for observation and monitoring, [facility] in case patient ultimately requires procedure.   Patient updated on results & plan of care & is in agreement.   19:51: CONSULT: Discussed with hospitalist Dr. Rachael Darbyhotiner- accepts admission.   Findings and plan of care discussed with supervising physician Dr. Fredderick PhenixBelfi who is in agreement.   Portions of this note were generated with Scientist, clinical (histocompatibility and immunogenetics)Dragon dictation software. Dictation errors may occur despite best attempts at proofreading.  Final Clinical Impression(s) / ED Diagnoses Final diagnoses:  Recurrent pneumothorax    Rx / DC Orders ED Discharge Orders     None        Cherly Andersonetrucelli, Amarian Botero R, PA-C 06/13/21 1957    Rolan BuccoBelfi, Melanie, MD 06/13/21 2349

## 2021-06-13 NOTE — ED Notes (Signed)
Attempted to call report. They needed 5 minutes to look at the chart.

## 2021-06-13 NOTE — ED Notes (Signed)
[facility] called for transport. 

## 2021-06-13 NOTE — ED Notes (Signed)
Report given to [facility]. 

## 2021-06-14 ENCOUNTER — Other Ambulatory Visit: Payer: Self-pay

## 2021-06-14 DIAGNOSIS — J939 Pneumothorax, unspecified: Secondary | ICD-10-CM | POA: Diagnosis not present

## 2021-06-14 DIAGNOSIS — R0789 Other chest pain: Secondary | ICD-10-CM | POA: Diagnosis not present

## 2021-06-14 LAB — BASIC METABOLIC PANEL
Anion gap: 10 (ref 5–15)
BUN: 8 mg/dL (ref 6–20)
CO2: 28 mmol/L (ref 22–32)
Calcium: 9.1 mg/dL (ref 8.9–10.3)
Chloride: 101 mmol/L (ref 98–111)
Creatinine, Ser: 0.88 mg/dL (ref 0.61–1.24)
GFR, Estimated: >60 mL/min (ref >60)
Glucose, Bld: 112 mg/dL — ABNORMAL HIGH (ref 70–99)
Potassium: 3.3 mmol/L — ABNORMAL LOW (ref 3.5–5.1)
Sodium: 139 mmol/L (ref 135–145)

## 2021-06-14 LAB — CBC
HCT: 38.8 % — ABNORMAL LOW (ref 39.0–52.0)
Hemoglobin: 13.1 g/dL (ref 13.0–17.0)
MCH: 33 pg (ref 26.0–34.0)
MCHC: 33.8 g/dL (ref 30.0–36.0)
MCV: 97.7 fL (ref 80.0–100.0)
Platelets: 270 10*3/uL (ref 150–400)
RBC: 3.97 MIL/uL — ABNORMAL LOW (ref 4.22–5.81)
RDW: 13.1 % (ref 11.5–15.5)
WBC: 5.7 10*3/uL (ref 4.0–10.5)
nRBC: 0 % (ref 0.0–0.2)

## 2021-06-14 LAB — HIV ANTIBODY (ROUTINE TESTING W REFLEX): HIV Screen 4th Generation wRfx: NONREACTIVE

## 2021-06-14 MED ORDER — MORPHINE SULFATE (PF) 2 MG/ML IV SOLN
2.0000 mg | INTRAVENOUS | Status: DC | PRN
  Administered 2021-06-14 – 2021-06-15 (×4): 2 mg via INTRAVENOUS
  Filled 2021-06-14 (×4): qty 1

## 2021-06-14 MED ORDER — ONDANSETRON HCL 4 MG/2ML IJ SOLN: 4.0000 mg | Freq: Four times a day (QID) | INTRAMUSCULAR | Status: DC | PRN

## 2021-06-14 MED ORDER — POTASSIUM CHLORIDE CRYS ER 20 MEQ PO TBCR
40.0000 meq | EXTENDED_RELEASE_TABLET | Freq: Once | ORAL | Status: AC
  Administered 2021-06-14: 40 meq via ORAL
  Filled 2021-06-14: qty 2

## 2021-06-14 MED ORDER — LABETALOL HCL 5 MG/ML IV SOLN: 5.0000 mg | INTRAVENOUS | Status: DC | PRN

## 2021-06-14 MED ORDER — TRAMADOL HCL 50 MG PO TABS
50.0000 mg | ORAL_TABLET | Freq: Four times a day (QID) | ORAL | Status: DC | PRN
  Administered 2021-06-14 – 2021-06-15 (×2): 50 mg via ORAL
  Filled 2021-06-14 (×2): qty 1

## 2021-06-14 NOTE — Progress Notes (Signed)
Provide IS to the pt per MD order. Educated the pt how to use the IS, the frequency and its benifts. Pt verbalized understanding and performed it correctly  Lawson Radar, RN .

## 2021-06-14 NOTE — Plan of Care (Signed)

## 2021-06-14 NOTE — Progress Notes (Signed)
PROGRESS NOTE    Ray Sitteryrone Leppo  GNF:[id number] DOB: 1961-10-22 DOA: 06/13/2021 PCP: Knox RoyaltyJones, Enrico, MD     Brief Narrative:  Ray [name] is a 60 y.o. male with medical history significant for HTN, hx of pulmonary blebs with spontaneous pneumothorax in past. He presents with complaint of right sided upper chest pain, pain under right shoulder blade and SOB that started around 1 pm today when at work. 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 He suddenly developed a chest pressure in right upper chest and under right shoulder blade. The pressure worsened with deep inspiration and movement of upper body. Nothing helped relieve the pain. He states it felt similar to previous pneumothorax he has had in past. He reports associated diaphoresis and nausea with lightheadedness.  He did not have any syncope or vomiting.  Reports he had a mild dry cough over the last few days which is not uncommon for him when the seasons change he states.  He has not had any fever, chills, hemoptysis, leg pain or swelling, denies any recent prolonged immobilization or travel.  He states he has no history of DVT or PEs.  Work-up in the emergency department revealed 15% right-sided upper lung pneumothorax.  Patient required nonrebreather for oxygenation.  Cardiothoracic surgery was consulted.  [facility] for close monitoring by cardiothoracic surgery service.  New events last 24 hours / Subjective: Patient had taken off his oxygen this morning to eat breakfast.  His oxygen saturations are in the high 90s on room air.  He denies any shortness of breath.  He states that he does not tolerate Dilaudid or oxycodone due to nausea.  Asking for morphine and tramadol instead.  Assessment & Plan:   Principal Problem:   Pneumothorax on right Active Problems:   Hypertension   Lung blebs (HCC)   Recurrent spontaneous  pneumothorax, on right -Hx right-sided spontaneous pneumothorax requiring chest tube in March 2018, status post VATS of recurrent right-sided pneumothorax December 2018, had left-sided spontaneous pneumothorax status post VATS November 2020 -Now presenting with spontaneous pneumothorax on the right -Cardiothoracic surgery consulted  Acute hypoxemic respiratory failure -Required nonrebreather in the emergency department -On room air this morning during breakfast, was replaced on 9 L nonrebreather mask later in the morning  Hypertension -Continue Norvasc  Hyperlipidemia -Continue Lipitor  Hypokalemia -Replace, trend  DVT prophylaxis: SCDs   Code Status:     Code Status Orders  (From admission, onward)           Start     Ordered   06/13/21 2044  Full code  Continuous        06/13/21 2043           Code Status History     Date Active Date Inactive Code Status Order ID Comments User Context   11/06/2019 0359 11/12/2019 1915 Full Code [id number]  Pearson GrippeKim, James, MD ED   12/19/2017 1346 12/23/2017 1324 Full Code [id number]  Rowe ClackGold, Wayne E, PA-C ED   02/26/2017 0957 03/01/2017 1541 Full Code [id number]  Richarda OverlieAbrol, Nayana, MD ED      Family Communication: No family at bedside Disposition Plan:  Status is: Observation  The patient will require care spanning > 2 midnights and should be moved to inpatient because: Hemodynamically unstable and Inpatient level of care appropriate due to severity of illness  Dispo: The patient is from: Home  Anticipated d/c is to: Home              Patient currently is not medically stable to d/c.   Difficult to place patient No      Consultants:  Cardiothoracic surgery  Procedures:  None  Antimicrobials:  Anti-infectives (From admission, onward)    None        Objective: Vitals:   06/13/21 2336 06/14/21 0632 06/14/21 0806 06/14/21 1146  BP: (!) 168/100 (!) 145/86 (!) 162/94 (!) 146/87  Pulse: 100 87 (!) 102 85  Resp:  17 20 17 20   Temp: 98.8 F (37.1 C) 98.6 F (37 C) 98.9 F (37.2 C) 98.6 F (37 C)  TempSrc: Oral Oral Oral Oral  SpO2: 100% 100% 98% 100%  Weight: 64.6 kg     Height:        Intake/Output Summary (Last 24 hours) at 06/14/2021 1308 Last data filed at 06/14/2021 0500 Gross per 24 hour  Intake 500 ml  Output 300 ml  Net 200 ml   Filed Weights   06/13/21 1726 06/13/21 2336  Weight: 70.3 kg 64.6 kg    Examination:  General exam: Appears calm and comfortable  Respiratory system: Diminished breath sounds but clear overall without wheeze or rhonchi, no respiratory distress Cardiovascular system: S1 & S2 heard, RRR. No murmurs. No pedal edema. Gastrointestinal system: Abdomen is nondistended, soft and nontender. Normal bowel sounds heard. Central nervous system: Alert and oriented. No focal neurological deficits. Speech clear.  Extremities: Symmetric in appearance  Skin: No rashes, lesions or ulcers on exposed skin  Psychiatry: Judgement and insight appear normal. Mood & affect appropriate.   Data Reviewed: I have personally reviewed following labs and imaging studies  CBC: Recent Labs  Lab 06/13/21 1614 06/14/21 0121  WBC 5.4 5.7  NEUTROABS 4.3  --   HGB 13.9 13.1  HCT 40.6 38.8*  MCV 98.3 97.7  PLT 320 270   Basic Metabolic Panel: Recent Labs  Lab 06/13/21 1614 06/14/21 0121  NA 141 139  K 3.5 3.3*  CL 106 101  CO2 24 28  GLUCOSE 120* 112*  BUN 12 8  CREATININE 0.85 0.88  CALCIUM 9.1 9.1   GFR: Estimated Creatinine Clearance: 84.6 mL/min (by C-G formula based on SCr of 0.88 mg/dL). Liver Function Tests: Recent Labs  Lab 06/13/21 1614  AST 183*  ALT 56*  ALKPHOS 67  BILITOT 0.6  PROT 8.0  ALBUMIN 4.6   Recent Labs  Lab 06/13/21 1614  LIPASE 59*   No results for input(s): AMMONIA in the last 168 hours. Coagulation Profile: No results for input(s): INR, PROTIME in the last 168 hours. Cardiac Enzymes: No results for input(s): CKTOTAL, CKMB,  CKMBINDEX, TROPONINI in the last 168 hours. BNP (last 3 results) No results for input(s): PROBNP in the last 8760 hours. HbA1C: No results for input(s): HGBA1C in the last 72 hours. CBG: No results for input(s): GLUCAP in the last 168 hours. Lipid Profile: No results for input(s): CHOL, HDL, LDLCALC, TRIG, CHOLHDL, LDLDIRECT in the last 72 hours. Thyroid Function Tests: No results for input(s): TSH, T4TOTAL, FREET4, T3FREE, THYROIDAB in the last 72 hours. Anemia Panel: No results for input(s): VITAMINB12, FOLATE, FERRITIN, TIBC, IRON, RETICCTPCT in the last 72 hours. Sepsis Labs: No results for input(s): PROCALCITON, LATICACIDVEN in the last 168 hours.  Recent Results (from the past 240 hour(s))  Resp Panel by RT-PCR (Flu A&B, Covid) Nasopharyngeal Swab     Status: None   Collection Time: 06/13/21  7:35 PM   Specimen: Nasopharyngeal Swab; Nasopharyngeal(NP) swabs in vial transport medium  Result Value Ref Range Status   SARS Coronavirus 2 by RT PCR NEGATIVE NEGATIVE Final    Comment: (NOTE) SARS-CoV-2 target nucleic acids are NOT DETECTED.  The SARS-CoV-2 RNA is generally detectable in upper respiratory specimens during the acute phase of infection. The lowest concentration of SARS-CoV-2 viral copies this assay can detect is 138 copies/mL. A negative result does not preclude SARS-Cov-2 infection and should not be used as the sole basis for treatment or other patient management decisions. A negative result may occur with  improper specimen collection/handling, submission of specimen other than nasopharyngeal swab, presence of viral mutation(s) within the areas targeted by this assay, and inadequate number of viral copies(<138 copies/mL). A negative result must be combined with clinical observations, patient history, and epidemiological information. The expected result is Negative.  Fact Sheet for Patients:  BloggerCourse.com  Fact Sheet for Healthcare  Providers:  SeriousBroker.it  This test is no t yet approved or cleared by the [place] FDA and  has been authorized for detection and/or diagnosis of SARS-CoV-2 by FDA under an Emergency Use Authorization (EUA). This EUA will remain  in effect (meaning this test can be used) for the duration of the COVID-19 declaration under Section 564(b)(1) of the Act, 21 U.S.C.section 360bbb-3(b)(1), unless the authorization is terminated  or revoked sooner.       Influenza A by PCR NEGATIVE NEGATIVE Final   Influenza B by PCR NEGATIVE NEGATIVE Final    Comment: (NOTE) The Xpert Xpress SARS-CoV-2/FLU/RSV plus assay is intended as an aid in the diagnosis of influenza from Nasopharyngeal swab specimens and should not be used as a sole basis for treatment. Nasal washings and aspirates are unacceptable for Xpert Xpress SARS-CoV-2/FLU/RSV testing.  Fact Sheet for Patients: BloggerCourse.com  Fact Sheet for Healthcare Providers: SeriousBroker.it  This test is not yet approved or cleared by the [place] FDA and has been authorized for detection and/or diagnosis of SARS-CoV-2 by FDA under an Emergency Use Authorization (EUA). This EUA will remain in effect (meaning this test can be used) for the duration of the COVID-19 declaration under Section 564(b)(1) of the Act, 21 U.S.C. section 360bbb-3(b)(1), unless the authorization is terminated or revoked.  Performed at [facility], [address], [place], [place] [address]       Radiology Studies: CT Angio Chest PE W/Cm &/Or Wo Cm  Result Date: 06/13/2021 CLINICAL DATA:  60 year old male with concern for pulmonary embolism. EXAM: CT ANGIOGRAPHY CHEST WITH CONTRAST TECHNIQUE: Multidetector CT imaging of the chest was performed using the standard protocol during bolus administration of intravenous contrast. Multiplanar CT image reconstructions  and MIPs were obtained to evaluate the vascular anatomy. CONTRAST:  33mL OMNIPAQUE IOHEXOL 350 MG/ML SOLN COMPARISON:  Chest CT dated 11/07/2019 and radiograph dated 06/13/2021. FINDINGS: Evaluation of this exam is limited due to respiratory motion artifact. Cardiovascular: There is no cardiomegaly or pericardial effusion. The thoracic aorta is unremarkable. The origins of the great vessels of the aortic arch appear patent. Evaluation of the pulmonary arteries is limited due to respiratory motion artifact. No large central pulmonary artery embolus identified. Mediastinum/Nodes: There is no hilar or mediastinal adenopathy. The esophagus and the thyroid gland are grossly unremarkable. No mediastinal fluid collection. Lungs/Pleura: There is a right-sided pneumothorax measuring approximately 18 mm in thickness to the anterior pleural surface, likely 15%. No mediastinal shift. There is background of emphysema. Linear lucency at the right lung  base, suspicious for a small pulmonary laceration. No lobar consolidation or pleural effusion. Biapical scarring and postsurgical changes. The central airways are patent. Upper Abdomen: Fatty liver. Musculoskeletal: No acute osseous pathology. Review of the MIP images confirms the above findings. IMPRESSION: 1. Right-sided pneumothorax, likely 15%. 2. Emphysema and probable small pulmonary laceration at the right lung base. 3. No CT evidence of central pulmonary artery embolus. 4. Aortic Atherosclerosis (ICD10-I70.0) and Emphysema (ICD10-J43.9). These results were called by telephone at the time of interpretation on 06/13/2021 at 6:29 pm to provider [facility] , who verbally acknowledged these results. Electronically Signed   By: Elgie Collard M.D.   On: 06/13/2021 18:32   DG Chest Portable 1 View  Result Date: 06/13/2021 CLINICAL DATA:  Right-sided chest pain for several hours EXAM: PORTABLE CHEST 1 VIEW COMPARISON:  06/19/2020 FINDINGS: Cardiac shadow is stable.  Aortic calcifications are noted. Postsurgical changes are seen in the right apex. No focal infiltrate, effusion or pneumothorax is seen. No bony abnormality is noted. IMPRESSION: No active disease. Electronically Signed   By: Alcide Clever M.D.   On: 06/13/2021 16:45      Scheduled Meds:  amLODipine  10 mg Oral Daily   atorvastatin  20 mg Oral Daily   pantoprazole  40 mg Oral Daily   sodium chloride flush  3 mL Intravenous Q12H   Continuous Infusions:  sodium chloride       LOS: 0 days      Time spent: 25 minutes   Noralee Stain, DO [facility] 06/14/2021, 1:08 PM   Available via Epic secure chat 7am-7pm After these hours, please refer to coverage provider listed on [email or web address]

## 2021-06-14 NOTE — Consult Note (Signed)
[address]       [place] [address]             [phone]                    Ray [name] [facility] Medical Record #[id number] Date of Birth: February 12, 1961  Referring: No ref. provider found Primary Care: Knox Royalty, MD Primary Cardiologist: None  Chief Complaint:    Chief Complaint  Patient presents with   Chest Pain    History of Present Illness:    Ray [name] 60 y.o. male admitted from the emergency department with pleuritic chest pain.  He has a history of right-sided pneumothoraces and is undergone thoracoscopy with wedge resection mechanical pleurodesis x2 in the past.  CT scan was performed to evaluate for pulmonary embolism, and noted a 15% right-sided pneumothorax.  CTS was consulted to assist with management.    Past Medical History:  Diagnosis Date   Atelectasis    r/side   Hypertension     Past Surgical History:  Procedure Laterality Date   COLONOSCOPY     done at age 23 per pt and was told to have it in 10 years again   STAPLING OF BLEBS Right 12/20/2017   Procedure: STAPLING OF BLEBS;  Surgeon: Delight Ovens, MD;  Location: [facility] OR;  Service: Thoracic;  Laterality: Right;   STAPLING OF BLEBS Left 11/10/2019   Procedure: STAPLING OF BLEBS;  Surgeon: Delight Ovens, MD;  Location: [facility] OR;  Service: Thoracic;  Laterality: Left;   VIDEO ASSISTED THORACOSCOPY Right 12/20/2017   Procedure: VIDEO ASSISTED THORACOSCOPY;  Surgeon: Delight Ovens, MD;  Location: [facility] OR;  Service: Thoracic;  Laterality: Right;   VIDEO ASSISTED THORACOSCOPY Left 11/10/2019   Procedure: VIDEO ASSISTED THORACOSCOPY;  Surgeon: Delight Ovens, MD;  Location: [facility] OR;  Service: Thoracic;  Laterality: Left;   VIDEO BRONCHOSCOPY N/A 12/20/2017   Procedure: VIDEO BRONCHOSCOPY;  Surgeon: Delight Ovens, MD;  Location: [facility] OR;  Service: Thoracic;  Laterality: N/A;   VIDEO BRONCHOSCOPY N/A 11/10/2019   Procedure: VIDEO BRONCHOSCOPY;  Surgeon:  Delight Ovens, MD;  Location: [facility] OR;  Service: Thoracic;  Laterality: N/A;    Family History  Problem Relation Age of Onset   Hypertension Mother    Colon cancer Neg Hx    Esophageal cancer Neg Hx      Social History   Tobacco Use  Smoking Status Former   Pack years: 0.00  Smokeless Tobacco Never  Tobacco Comments   quit 20 years ago    Social History   Substance and Sexual Activity  Alcohol Use Yes   Comment: pt reports 1 beer daily     Allergies  Allergen Reactions   Asa [Aspirin] Nausea Only   Lisinopril Swelling    Lip/Tongue swelling     Current Facility-Administered Medications  Medication Dose Route Frequency Provider Last Rate Last Admin   0.9 %  sodium chloride infusion  250 mL Intravenous PRN Chotiner, Claudean Severance, MD       acetaminophen (TYLENOL) tablet 650 mg  650 mg Oral Q6H PRN Chotiner, Claudean Severance, MD   650 mg at 06/14/21 3299   Or   acetaminophen (TYLENOL) suppository 650 mg  650 mg Rectal Q6H PRN Chotiner, Claudean Severance, MD       amLODipine (NORVASC) tablet 10 mg  10 mg Oral Daily Chotiner, Claudean Severance, MD   10 mg at 06/14/21 [phone]  atorvastatin (LIPITOR) tablet 20 mg  20 mg Oral Daily Chotiner, Claudean Severance, MD   20 mg at 06/14/21 0815   labetalol (NORMODYNE) injection 5 mg  5 mg Intravenous Q2H PRN Noralee Stain, DO       morphine 2 MG/ML injection 2 mg  2 mg Intravenous Q4H PRN Noralee Stain, DO   2 mg at 06/14/21 1151   ondansetron (ZOFRAN) injection 4 mg  4 mg Intravenous Q6H PRN Opyd, Lavone Neri, MD       pantoprazole (PROTONIX) EC tablet 40 mg  40 mg Oral Daily Chotiner, Claudean Severance, MD   40 mg at 06/14/21 0815   sodium chloride flush (NS) 0.9 % injection 3 mL  3 mL Intravenous Q12H Chotiner, Claudean Severance, MD   3 mL at 06/14/21 0815   sodium chloride flush (NS) 0.9 % injection 3 mL  3 mL Intravenous PRN Chotiner, Claudean Severance, MD       traMADol Janean Sark) tablet 50 mg  50 mg Oral Q6H PRN Noralee Stain, DO       traZODone (DESYREL) tablet 50 mg  50 mg Oral  QHS PRN Chotiner, Claudean Severance, MD        Review of Systems  Constitutional: Negative.   Respiratory:  Positive for shortness of breath.   Cardiovascular:  Positive for chest pain.   PHYSICAL EXAMINATION: BP (!) 146/87 (BP Location: Left Arm)   Pulse 85   Temp 98.6 F (37 C) (Oral)   Resp 20   Ht 6' (1.829 m)   Wt 64.6 kg   SpO2 100%   BMI 19.33 kg/m   Physical Exam Constitutional:      General: He is not in acute distress.    Appearance: He is well-developed. He is not ill-appearing.  HENT:     Head: Normocephalic and atraumatic.  Eyes:     Extraocular Movements: Extraocular movements intact.  Cardiovascular:     Rate and Rhythm: Normal rate.  Pulmonary:     Effort: Pulmonary effort is normal. No tachypnea.  Skin:    General: Skin is warm and dry.  Neurological:     Mental Status: He is alert.     Diagnostic Studies & Laboratory data:     Recent Radiology Findings:   CT Angio Chest PE W/Cm &/Or Wo Cm  Result Date: 06/13/2021 CLINICAL DATA:  60 year old male with concern for pulmonary embolism. EXAM: CT ANGIOGRAPHY CHEST WITH CONTRAST TECHNIQUE: Multidetector CT imaging of the chest was performed using the standard protocol during bolus administration of intravenous contrast. Multiplanar CT image reconstructions and MIPs were obtained to evaluate the vascular anatomy. CONTRAST:  9mL OMNIPAQUE IOHEXOL 350 MG/ML SOLN COMPARISON:  Chest CT dated 11/07/2019 and radiograph dated 06/13/2021. FINDINGS: Evaluation of this exam is limited due to respiratory motion artifact. Cardiovascular: There is no cardiomegaly or pericardial effusion. The thoracic aorta is unremarkable. The origins of the great vessels of the aortic arch appear patent. Evaluation of the pulmonary arteries is limited due to respiratory motion artifact. No large central pulmonary artery embolus identified. Mediastinum/Nodes: There is no hilar or mediastinal adenopathy. The esophagus and the thyroid gland are grossly  unremarkable. No mediastinal fluid collection. Lungs/Pleura: There is a right-sided pneumothorax measuring approximately 18 mm in thickness to the anterior pleural surface, likely 15%. No mediastinal shift. There is background of emphysema. Linear lucency at the right lung base, suspicious for a small pulmonary laceration. No lobar consolidation or pleural effusion. Biapical scarring and postsurgical changes. The central airways are  patent. Upper Abdomen: Fatty liver. Musculoskeletal: No acute osseous pathology. Review of the MIP images confirms the above findings. IMPRESSION: 1. Right-sided pneumothorax, likely 15%. 2. Emphysema and probable small pulmonary laceration at the right lung base. 3. No CT evidence of central pulmonary artery embolus. 4. Aortic Atherosclerosis (ICD10-I70.0) and Emphysema (ICD10-J43.9). These results were called by telephone at the time of interpretation on 06/13/2021 at 6:29 pm to provider [facility] , who verbally acknowledged these results. Electronically Signed   By: Elgie Collard M.D.   On: 06/13/2021 18:32   DG Chest Portable 1 View  Result Date: 06/13/2021 CLINICAL DATA:  Right-sided chest pain for several hours EXAM: PORTABLE CHEST 1 VIEW COMPARISON:  06/19/2020 FINDINGS: Cardiac shadow is stable. Aortic calcifications are noted. Postsurgical changes are seen in the right apex. No focal infiltrate, effusion or pneumothorax is seen. No bony abnormality is noted. IMPRESSION: No active disease. Electronically Signed   By: Alcide Clever M.D.   On: 06/13/2021 16:45       I have independently reviewed the above radiology studies  and reviewed the findings with the patient.   Recent Lab Findings: Lab Results  Component Value Date   WBC 5.7 06/14/2021   HGB 13.1 06/14/2021   HCT 38.8 (L) 06/14/2021   PLT 270 06/14/2021   GLUCOSE 112 (H) 06/14/2021   ALT 56 (H) 06/13/2021   AST 183 (H) 06/13/2021   NA 139 06/14/2021   K 3.3 (L) 06/14/2021   CL 101 06/14/2021    CREATININE 0.88 06/14/2021   BUN 8 06/14/2021   CO2 28 06/14/2021   INR 1.0 11/09/2019       Assessment / Plan:   60 year old male with 15% right-sided basilar pneumothorax.  He has a history of bullous emphysema and has undergone wedge resection mechanical pleurodesis x2 in the past.  Most recent surgery was in 2020.  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  On review of the cross-sectional imaging he does still have some apical bulla, but surgical intervention at this point would likely lead to a worsening air leaks upon entry.  Would recommend continued pulmonary toilet with incentive spirometry.  Would only consider surgery if his symptoms worsens.  Of note the pneumothorax was not present on his chest x-ray.      Corliss Skains 06/14/2021 3:44 PM

## 2021-06-14 NOTE — Progress Notes (Signed)
Pt received via carelink. Pt AO x4. Vomiting clear fluid, received Zofran on route via carelink. Pt stated dilaudid makes him vomit. CHG bath completed.  Connected to tele and CCMD notified. Pt is in 10l non-rebreather sating 100%. Oriented pt to room and call bell system. Call bell within reach. [facility] paged notifying pt's arrival. Will continue to monitor.

## 2021-06-15 ENCOUNTER — Observation Stay (HOSPITAL_COMMUNITY): Payer: PRIVATE HEALTH INSURANCE

## 2021-06-15 DIAGNOSIS — J939 Pneumothorax, unspecified: Secondary | ICD-10-CM | POA: Diagnosis not present

## 2021-06-15 DIAGNOSIS — E785 Hyperlipidemia, unspecified: Secondary | ICD-10-CM

## 2021-06-15 LAB — BASIC METABOLIC PANEL
Anion gap: 7 (ref 5–15)
BUN: 7 mg/dL (ref 6–20)
CO2: 29 mmol/L (ref 22–32)
Calcium: 9.1 mg/dL (ref 8.9–10.3)
Chloride: 100 mmol/L (ref 98–111)
Creatinine, Ser: 0.9 mg/dL (ref 0.61–1.24)
GFR, Estimated: >60 mL/min (ref >60)
Glucose, Bld: 112 mg/dL — ABNORMAL HIGH (ref 70–99)
Potassium: 3.5 mmol/L (ref 3.5–5.1)
Sodium: 136 mmol/L (ref 135–145)

## 2021-06-15 LAB — MAGNESIUM: Magnesium: 1.4 mg/dL — ABNORMAL LOW (ref 1.7–2.4)

## 2021-06-15 MED ORDER — POTASSIUM CHLORIDE CRYS ER 20 MEQ PO TBCR
40.0000 meq | EXTENDED_RELEASE_TABLET | Freq: Once | ORAL | Status: AC
  Administered 2021-06-15: 40 meq via ORAL
  Filled 2021-06-15: qty 2

## 2021-06-15 MED ORDER — MAGNESIUM SULFATE 2 GM/50ML IV SOLN
2.0000 g | Freq: Once | INTRAVENOUS | Status: AC
  Administered 2021-06-15: 2 g via INTRAVENOUS
  Filled 2021-06-15: qty 50

## 2021-06-15 NOTE — Progress Notes (Signed)
     [address]       [place] [address]             [phone]       Pleuritic pain improved  Vitals:   06/15/21 0308 06/15/21 0738  BP: (!) 146/93 (!) 141/84  Pulse: 74 86  Resp: 16 (!) 21  Temp: 98.4 F (36.9 C) 98.3 F (36.8 C)  SpO2: 97% 98%    Alert NAD Sinus EWOB   CXR today.  If stable ok for D/C today or tomorrow.  Stefanee Mckell Keane Scrape

## 2021-06-15 NOTE — Progress Notes (Signed)
Discharge instructions provided to patient. Need for follow-up reviewed. All questions answered. IV removed. Patient to be escorted home by his daughter.   Allegra Grana RN

## 2021-06-15 NOTE — Discharge Summary (Signed)
Physician Discharge Summary  Ray [name] WJX:[id number] DOB: Jul 21, 1961 DOA: 06/13/2021  PCP: Knox Royalty, MD  Admit date: 06/13/2021 Discharge date: 06/15/2021  Admitted From: Home Disposition:  Home  Recommendations for Outpatient Follow-up:  Follow up with Cardiothoracic surgery in 1 week   Discharge Condition: Stable CODE STATUS: Full  Diet recommendation: Regular   Brief/Interim Summary: Ray [name] is a 60 y.o. male with medical history significant for HTN, hx of pulmonary blebs with spontaneous pneumothorax in past. He presents with complaint of right sided upper chest pain, pain under right shoulder blade and SOB that started around 1 pm today when at work. 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 He suddenly developed a chest pressure in right upper chest and under right shoulder blade. The pressure worsened with deep inspiration and movement of upper body. Nothing helped relieve the pain. He states it felt similar to previous pneumothorax he has had in past. He reports associated diaphoresis and nausea with lightheadedness.  He did not have any syncope or vomiting.  Reports he had a mild dry cough over the last few days which is not uncommon for him when the seasons change he states.  He has not had any fever, chills, hemoptysis, leg pain or swelling, denies any recent prolonged immobilization or travel.  He states he has no history of DVT or PEs.   Work-up in the emergency department revealed 15% right-sided upper lung pneumothorax.  Patient required nonrebreather for oxygenation.  Cardiothoracic surgery was consulted.  Patient was transferred from [facility] to [facility] for close monitoring by cardiothoracic surgery service.   Subjective on day of discharge: Patient seen walking around his room, organizing his belongings, on room air without dyspnea.  He was reevaluated by cardiothoracic surgery with repeat chest  x-ray showing unchanged small right apical pneumothorax.  Discharge Diagnoses:  Principal Problem:   Pneumothorax on right Active Problems:   Hypertension   Lung blebs (HCC)   Hypokalemia   HLD (hyperlipidemia)   Hypomagnesemia   Recurrent spontaneous pneumothorax, on right -Hx right-sided spontaneous pneumothorax requiring chest tube in March 2018, status post VATS of recurrent right-sided pneumothorax December 2018, had left-sided spontaneous pneumothorax status post VATS November 2020 -Now presenting with spontaneous pneumothorax on the right -Cardiothoracic surgery following -Chest x-ray remains stable   Acute hypoxemic respiratory failure -Required nonrebreather in the emergency department -Weaned to room air   Hypertension -Continue Norvasc  Hyperlipidemia -Continue Lipitor   Hypomagnesemia Hypokalemia -Replace, trend     Discharge Instructions  Discharge Instructions     Call MD for:  difficulty breathing, headache or visual disturbances   Complete by: As directed    Call MD for:  extreme fatigue   Complete by: As directed    Call MD for:  persistant dizziness or light-headedness   Complete by: As directed    Call MD for:  persistant nausea and vomiting   Complete by: As directed    Call MD for:  severe uncontrolled pain   Complete by: As directed    Call MD for:  temperature >100.4   Complete by: As directed    Discharge instructions   Complete by: As directed    You were cared for by a hospitalist during your hospital stay. If you have any questions about your discharge medications or the care you received while you were in the hospital after you are discharged, you can call the unit and  ask to speak with the hospitalist on call if the hospitalist that took care of you is not available. Once you are discharged, your primary care physician will handle any further medical issues. Please note that NO REFILLS for any discharge medications will be authorized  once you are discharged, as it is imperative that you return to your primary care physician (or establish a relationship with a primary care physician if you do not have one) for your aftercare needs so that they can reassess your need for medications and monitor your lab values.   Increase activity slowly   Complete by: As directed       Allergies as of 06/15/2021       Reactions   Asa [aspirin] Nausea Only   Lisinopril Swelling   Lip/Tongue swelling         Medication List     STOP taking these medications    bisoprolol-hydrochlorothiazide 10-6.25 MG tablet Commonly known as: Ziac   ondansetron 4 MG disintegrating tablet Commonly known as: Zofran ODT   potassium chloride SA 20 MEQ tablet Commonly known as: KLOR-CON       TAKE these medications    amLODipine 10 MG tablet Commonly known as: NORVASC Take 1 tablet (10 mg total) by mouth daily.   atorvastatin 20 MG tablet Commonly known as: LIPITOR Take 20 mg by mouth daily.   pantoprazole 40 MG tablet Commonly known as: PROTONIX Take 1 tablet (40 mg total) by mouth daily. Please call [phone] to schedule an office visit for more refills. What changed: additional instructions   traZODone 50 MG tablet Commonly known as: DESYREL Take 50 mg by mouth at bedtime as needed for sleep.   Vitamin D (Ergocalciferol) 1.25 MG (50000 UNIT) Caps capsule Commonly known as: DRISDOL Take 50,000 Units by mouth once a week.        Follow-up Information     Lightfoot, Eliezer LoftsHarrell O, MD. Schedule an appointment as soon as possible for a visit in 1 week(s).   Specialty: Cardiothoracic Surgery Contact information: [address] [place] [address] [phone]                Allergies  Allergen Reactions   Jonne Plysa [Aspirin] Nausea Only   Lisinopril Swelling    Lip/Tongue swelling     Consultations: Cardiothoracic surgery    Procedures/Studies: DG Chest 2 View  Result Date: 06/15/2021 CLINICAL  DATA:  Follow-up pneumothorax EXAM: CHEST - 2 VIEW COMPARISON:  06/13/2021 CT FINDINGS: Small right apical pneumothorax. No interval progression. Postoperative right apex. No edema, consolidation, or effusion. Normal heart size and mediastinal contours IMPRESSION: Unchanged small right apical pneumothorax. Electronically Signed   By: Marnee SpringJonathon  Watts M.D.   On: 06/15/2021 11:49   CT Angio Chest PE W/Cm &/Or Wo Cm  Result Date: 06/13/2021 CLINICAL DATA:  60 year old male with concern for pulmonary embolism. EXAM: CT ANGIOGRAPHY CHEST WITH CONTRAST TECHNIQUE: Multidetector CT imaging of the chest was performed using the standard protocol during bolus administration of intravenous contrast. Multiplanar CT image reconstructions and MIPs were obtained to evaluate the vascular anatomy. CONTRAST:  80mL OMNIPAQUE IOHEXOL 350 MG/ML SOLN COMPARISON:  Chest CT dated 11/07/2019 and radiograph dated 06/13/2021. FINDINGS: Evaluation of this exam is limited due to respiratory motion artifact. Cardiovascular: There is no cardiomegaly or pericardial effusion. The thoracic aorta is unremarkable. The origins of the great vessels of the aortic arch appear patent. Evaluation of the pulmonary arteries is limited due to respiratory motion artifact. No  large central pulmonary artery embolus identified. Mediastinum/Nodes: There is no hilar or mediastinal adenopathy. The esophagus and the thyroid gland are grossly unremarkable. No mediastinal fluid collection. Lungs/Pleura: There is a right-sided pneumothorax measuring approximately 18 mm in thickness to the anterior pleural surface, likely 15%. No mediastinal shift. There is background of emphysema. Linear lucency at the right lung base, suspicious for a small pulmonary laceration. No lobar consolidation or pleural effusion. Biapical scarring and postsurgical changes. The central airways are patent. Upper Abdomen: Fatty liver. Musculoskeletal: No acute osseous pathology. Review of the  MIP images confirms the above findings. IMPRESSION: 1. Right-sided pneumothorax, likely 15%. 2. Emphysema and probable small pulmonary laceration at the right lung base. 3. No CT evidence of central pulmonary artery embolus. 4. Aortic Atherosclerosis (ICD10-I70.0) and Emphysema (ICD10-J43.9). These results were called by telephone at the time of interpretation on 06/13/2021 at 6:29 pm to provider [facility] , who verbally acknowledged these results. Electronically Signed   By: Elgie Collard M.D.   On: 06/13/2021 18:32   DG Chest Portable 1 View  Result Date: 06/13/2021 CLINICAL DATA:  Right-sided chest pain for several hours EXAM: PORTABLE CHEST 1 VIEW COMPARISON:  06/19/2020 FINDINGS: Cardiac shadow is stable. Aortic calcifications are noted. Postsurgical changes are seen in the right apex. No focal infiltrate, effusion or pneumothorax is seen. No bony abnormality is noted. IMPRESSION: No active disease. Electronically Signed   By: Alcide Clever M.D.   On: 06/13/2021 16:45       Discharge Exam: Vitals:   06/15/21 0308 06/15/21 0738  BP: (!) 146/93 (!) 141/84  Pulse: 74 86  Resp: 16 (!) 21  Temp: 98.4 F (36.9 C) 98.3 F (36.8 C)  SpO2: 97% 98%     General: Pt is alert, awake, not in acute distress Cardiovascular: RRR, S1/S2 +, no edema Respiratory: Diminished breath sounds on right, no respiratory distress, no conversational dyspnea, on room air Abdominal: Soft, NT, ND, bowel sounds + Extremities: no edema, no cyanosis Psych: Normal mood and affect, stable judgement and insight     The results of significant diagnostics from this hospitalization (including imaging, microbiology, ancillary and laboratory) are listed below for reference.     Microbiology: Recent Results (from the past 240 hour(s))  Resp Panel by RT-PCR (Flu A&B, Covid) Nasopharyngeal Swab     Status: None   Collection Time: 06/13/21  7:35 PM   Specimen: Nasopharyngeal Swab; Nasopharyngeal(NP) swabs in  vial transport medium  Result Value Ref Range Status   SARS Coronavirus 2 by RT PCR NEGATIVE NEGATIVE Final    Comment: (NOTE) SARS-CoV-2 target nucleic acids are NOT DETECTED.  The SARS-CoV-2 RNA is generally detectable in upper respiratory specimens during the acute phase of infection. The lowest concentration of SARS-CoV-2 viral copies this assay can detect is 138 copies/mL. A negative result does not preclude SARS-Cov-2 infection and should not be used as the sole basis for treatment or other patient management decisions. A negative result may occur with  improper specimen collection/handling, submission of specimen other than nasopharyngeal swab, presence of viral mutation(s) within the areas targeted by this assay, and inadequate number of viral copies(<138 copies/mL). A negative result must be combined with clinical observations, patient history, and epidemiological information. The expected result is Negative.  Fact Sheet for Patients:  BloggerCourse.com  Fact Sheet for Healthcare Providers:  SeriousBroker.it  This test is no t yet approved or cleared by the [place] FDA and  has been authorized for detection and/or diagnosis of  SARS-CoV-2 by FDA under an Emergency Use Authorization (EUA). This EUA will remain  in effect (meaning this test can be used) for the duration of the COVID-19 declaration under Section 564(b)(1) of the Act, 21 U.S.C.section 360bbb-3(b)(1), unless the authorization is terminated  or revoked sooner.       Influenza A by PCR NEGATIVE NEGATIVE Final   Influenza B by PCR NEGATIVE NEGATIVE Final    Comment: (NOTE) The Xpert Xpress SARS-CoV-2/FLU/RSV plus assay is intended as an aid in the diagnosis of influenza from Nasopharyngeal swab specimens and should not be used as a sole basis for treatment. Nasal washings and aspirates are unacceptable for Xpert Xpress SARS-CoV-2/FLU/RSV testing.  Fact  Sheet for Patients: BloggerCourse.com  Fact Sheet for Healthcare Providers: SeriousBroker.it  This test is not yet approved or cleared by the [place] FDA and has been authorized for detection and/or diagnosis of SARS-CoV-2 by FDA under an Emergency Use Authorization (EUA). This EUA will remain in effect (meaning this test can be used) for the duration of the COVID-19 declaration under Section 564(b)(1) of the Act, 21 U.S.C. section 360bbb-3(b)(1), unless the authorization is terminated or revoked.  Performed at [facility], [address], [place], [place] [address]      Labs: BNP (last 3 results) No results for input(s): BNP in the last 8760 hours. Basic Metabolic Panel: Recent Labs  Lab 06/13/21 1614 06/14/21 0121 06/15/21 0121  NA 141 139 136  K 3.5 3.3* 3.5  CL 106 101 100  CO2 24 28 29   GLUCOSE 120* 112* 112*  BUN 12 8 7   CREATININE 0.85 0.88 0.90  CALCIUM 9.1 9.1 9.1  MG  --   --  1.4*   Liver Function Tests: Recent Labs  Lab 06/13/21 1614  AST 183*  ALT 56*  ALKPHOS 67  BILITOT 0.6  PROT 8.0  ALBUMIN 4.6   Recent Labs  Lab 06/13/21 1614  LIPASE 59*   No results for input(s): AMMONIA in the last 168 hours. CBC: Recent Labs  Lab 06/13/21 1614 06/14/21 0121  WBC 5.4 5.7  NEUTROABS 4.3  --   HGB 13.9 13.1  HCT 40.6 38.8*  MCV 98.3 97.7  PLT 320 270   Cardiac Enzymes: No results for input(s): CKTOTAL, CKMB, CKMBINDEX, TROPONINI in the last 168 hours. BNP: Invalid input(s): POCBNP CBG: No results for input(s): GLUCAP in the last 168 hours. D-Dimer No results for input(s): DDIMER in the last 72 hours. Hgb A1c No results for input(s): HGBA1C in the last 72 hours. Lipid Profile No results for input(s): CHOL, HDL, LDLCALC, TRIG, CHOLHDL, LDLDIRECT in the last 72 hours. Thyroid function studies No results for input(s): TSH, T4TOTAL, T3FREE, THYROIDAB in the last 72  hours.  Invalid input(s): FREET3 Anemia work up No results for input(s): VITAMINB12, FOLATE, FERRITIN, TIBC, IRON, RETICCTPCT in the last 72 hours. Urinalysis    Component Value Date/Time   COLORURINE YELLOW 11/10/2019 0121   APPEARANCEUR CLEAR 11/10/2019 0121   LABSPEC 1.016 11/10/2019 0121   PHURINE 6.0 11/10/2019 0121   GLUCOSEU 50 (A) 11/10/2019 0121   HGBUR NEGATIVE 11/10/2019 0121   BILIRUBINUR NEGATIVE 11/10/2019 0121   KETONESUR NEGATIVE 11/10/2019 0121   PROTEINUR NEGATIVE 11/10/2019 0121   UROBILINOGEN 0.2 12/23/2013 1646   NITRITE NEGATIVE 11/10/2019 0121   LEUKOCYTESUR NEGATIVE 11/10/2019 0121   Sepsis Labs Invalid input(s): PROCALCITONIN,  WBC,  LACTICIDVEN Microbiology Recent Results (from the past 240 hour(s))  Resp Panel by RT-PCR (Flu A&B, Covid) Nasopharyngeal  Swab     Status: None   Collection Time: 06/13/21  7:35 PM   Specimen: Nasopharyngeal Swab; Nasopharyngeal(NP) swabs in vial transport medium  Result Value Ref Range Status   SARS Coronavirus 2 by RT PCR NEGATIVE NEGATIVE Final    Comment: (NOTE) SARS-CoV-2 target nucleic acids are NOT DETECTED.  The SARS-CoV-2 RNA is generally detectable in upper respiratory specimens during the acute phase of infection. The lowest concentration of SARS-CoV-2 viral copies this assay can detect is 138 copies/mL. A negative result does not preclude SARS-Cov-2 infection and should not be used as the sole basis for treatment or other patient management decisions. A negative result may occur with  improper specimen collection/handling, submission of specimen other than nasopharyngeal swab, presence of viral mutation(s) within the areas targeted by this assay, and inadequate number of viral copies(<138 copies/mL). A negative result must be combined with clinical observations, patient history, and epidemiological information. The expected result is Negative.  Fact Sheet for Patients:   BloggerCourse.com  Fact Sheet for Healthcare Providers:  SeriousBroker.it  This test is no t yet approved or cleared by the [place] FDA and  has been authorized for detection and/or diagnosis of SARS-CoV-2 by FDA under an Emergency Use Authorization (EUA). This EUA will remain  in effect (meaning this test can be used) for the duration of the COVID-19 declaration under Section 564(b)(1) of the Act, 21 U.S.C.section 360bbb-3(b)(1), unless the authorization is terminated  or revoked sooner.       Influenza A by PCR NEGATIVE NEGATIVE Final   Influenza B by PCR NEGATIVE NEGATIVE Final    Comment: (NOTE) The Xpert Xpress SARS-CoV-2/FLU/RSV plus assay is intended as an aid in the diagnosis of influenza from Nasopharyngeal swab specimens and should not be used as a sole basis for treatment. Nasal washings and aspirates are unacceptable for Xpert Xpress SARS-CoV-2/FLU/RSV testing.  Fact Sheet for Patients: BloggerCourse.com  Fact Sheet for Healthcare Providers: SeriousBroker.it  This test is not yet approved or cleared by the [place] FDA and has been authorized for detection and/or diagnosis of SARS-CoV-2 by FDA under an Emergency Use Authorization (EUA). This EUA will remain in effect (meaning this test can be used) for the duration of the COVID-19 declaration under Section 564(b)(1) of the Act, 21 U.S.C. section 360bbb-3(b)(1), unless the authorization is terminated or revoked.  Performed at [facility], [address] [place], [place] [address]      Patient was seen and examined on the day of discharge and was found to be in stable condition. Time coordinating discharge: 25 minutes including assessment and coordination of care, as well as examination of the patient.   SIGNED:  Noralee Stain, DO [facility] 06/15/2021, 12:19 PM

## 2021-06-25 ENCOUNTER — Other Ambulatory Visit: Payer: Self-pay | Admitting: Gastroenterology

## 2021-07-06 ENCOUNTER — Other Ambulatory Visit: Payer: Self-pay | Admitting: Thoracic Surgery (Cardiothoracic Vascular Surgery)

## 2021-07-06 DIAGNOSIS — J439 Emphysema, unspecified: Secondary | ICD-10-CM

## 2021-07-07 ENCOUNTER — Ambulatory Visit: Payer: PRIVATE HEALTH INSURANCE | Admitting: Thoracic Surgery (Cardiothoracic Vascular Surgery)

## 2021-07-11 ENCOUNTER — Ambulatory Visit: Payer: PRIVATE HEALTH INSURANCE

## 2021-07-11 ENCOUNTER — Ambulatory Visit: Payer: PRIVATE HEALTH INSURANCE | Admitting: Thoracic Surgery (Cardiothoracic Vascular Surgery)

## 2022-01-25 ENCOUNTER — Ambulatory Visit
Admission: RE | Admit: 2022-01-25 | Discharge: 2022-01-25 | Disposition: A | Discharge status: Home / Self Care | Payer: PRIVATE HEALTH INSURANCE | Source: Ambulatory Visit | Attending: Family Medicine | Admitting: Family Medicine

## 2022-01-25 ENCOUNTER — Other Ambulatory Visit: Payer: Self-pay

## 2022-01-25 ENCOUNTER — Other Ambulatory Visit: Payer: Self-pay | Admitting: Family Medicine

## 2022-01-25 DIAGNOSIS — M79641 Pain in right hand: Secondary | ICD-10-CM

## 2022-01-25 DIAGNOSIS — M25642 Stiffness of left hand, not elsewhere classified: Secondary | ICD-10-CM

## 2022-01-25 DIAGNOSIS — M79642 Pain in left hand: Secondary | ICD-10-CM

## 2022-01-25 DIAGNOSIS — M25641 Stiffness of right hand, not elsewhere classified: Secondary | ICD-10-CM

## 2022-04-11 ENCOUNTER — Other Ambulatory Visit: Payer: Self-pay

## 2022-04-11 ENCOUNTER — Emergency Department (HOSPITAL_COMMUNITY): Payer: PRIVATE HEALTH INSURANCE

## 2022-04-11 ENCOUNTER — Emergency Department (HOSPITAL_COMMUNITY)
Admission: EM | Admit: 2022-04-11 | Discharge: 2022-04-11 | Disposition: A | Discharge status: Home / Self Care | Payer: PRIVATE HEALTH INSURANCE | Attending: Emergency Medicine | Admitting: Emergency Medicine

## 2022-04-11 ENCOUNTER — Encounter (HOSPITAL_COMMUNITY): Payer: Self-pay

## 2022-04-11 DIAGNOSIS — M5410 Radiculopathy, site unspecified: Secondary | ICD-10-CM | POA: Diagnosis not present

## 2022-04-11 DIAGNOSIS — E876 Hypokalemia: Secondary | ICD-10-CM | POA: Diagnosis not present

## 2022-04-11 DIAGNOSIS — M79602 Pain in left arm: Secondary | ICD-10-CM | POA: Insufficient documentation

## 2022-04-11 DIAGNOSIS — Z79899 Other long term (current) drug therapy: Secondary | ICD-10-CM | POA: Diagnosis not present

## 2022-04-11 DIAGNOSIS — M25512 Pain in left shoulder: Secondary | ICD-10-CM | POA: Insufficient documentation

## 2022-04-11 DIAGNOSIS — R0789 Other chest pain: Secondary | ICD-10-CM | POA: Diagnosis not present

## 2022-04-11 DIAGNOSIS — R0602 Shortness of breath: Secondary | ICD-10-CM | POA: Insufficient documentation

## 2022-04-11 DIAGNOSIS — I10 Essential (primary) hypertension: Secondary | ICD-10-CM | POA: Diagnosis not present

## 2022-04-11 LAB — CBC
HCT: 36.3 % — ABNORMAL LOW (ref 39.0–52.0)
Hemoglobin: 12.2 g/dL — ABNORMAL LOW (ref 13.0–17.0)
MCH: 32.5 pg (ref 26.0–34.0)
MCHC: 33.6 g/dL (ref 30.0–36.0)
MCV: 96.8 fL (ref 80.0–100.0)
Platelets: 313 10*3/uL (ref 150–400)
RBC: 3.75 MIL/uL — ABNORMAL LOW (ref 4.22–5.81)
RDW: 12.8 % (ref 11.5–15.5)
WBC: 5.1 10*3/uL (ref 4.0–10.5)
nRBC: 0 % (ref 0.0–0.2)

## 2022-04-11 LAB — BASIC METABOLIC PANEL
Anion gap: 7 (ref 5–15)
BUN: 18 mg/dL (ref 6–20)
CO2: 22 mmol/L (ref 22–32)
Calcium: 8.5 mg/dL — ABNORMAL LOW (ref 8.9–10.3)
Chloride: 111 mmol/L (ref 98–111)
Creatinine, Ser: 1.06 mg/dL (ref 0.61–1.24)
GFR, Estimated: >60 mL/min (ref >60)
Glucose, Bld: 98 mg/dL (ref 70–99)
Potassium: 3.2 mmol/L — ABNORMAL LOW (ref 3.5–5.1)
Sodium: 140 mmol/L (ref 135–145)

## 2022-04-11 LAB — TROPONIN I (HIGH SENSITIVITY): Troponin I (High Sensitivity): 4 ng/L (ref <18)

## 2022-04-11 MED ORDER — IBUPROFEN 800 MG PO TABS
800.0000 mg | ORAL_TABLET | Freq: Once | ORAL | Status: AC
  Administered 2022-04-11: 800 mg via ORAL
  Filled 2022-04-11: qty 1

## 2022-04-11 MED ORDER — METHOCARBAMOL 500 MG PO TABS: 500.0000 mg | ORAL_TABLET | Freq: Two times a day (BID) | ORAL | 0 refills | Status: AC

## 2022-04-11 NOTE — Discharge Instructions (Addendum)
You were seen in the emergency department for shoulder pain. ? ?We discussed I think your symptoms are likely from an orthopedic problem.  It could be coming from your shoulder or your spine.  I recommend continuing to use the creams that you have, as well as ibuprofen.  Also prescribing you a muscle relaxer that you can try. ? ?I have attached the contact information for the orthopedic doctor, for you to call and make an appointment. ?

## 2022-04-11 NOTE — ED Triage Notes (Signed)
Pt presents to ED from home with c/o left side shoulder pain that began x 1 week ago. Pt endorses left side chest pain and SOB x 1 week. Hx collapsed lung ?

## 2022-04-11 NOTE — ED Provider Notes (Signed)
?[facility] DEPT ?Provider Note ? ? ?CSN: [id number] ?Arrival date & time: 04/11/22  2028 ? ?  ? ?History ? ?Chief Complaint  ?Patient presents with  ? Shoulder Pain  ? Chest Pain  ? ? ?Ray [name] is a 61 y.o. male who presents the emergency department complaining of left shoulder pain and left-sided chest pain for a week.  Patient has a history of a recurrent pneumothorax.  He states that he had noticed a "knot" on his left shoulder blade, that he thinks is gone down in size.  He has been trying some topical creams without relief.  He states that he is "not a medicine person", so has not been taking any medicine by mouth for his symptoms.  He is also complaining of some numbness in his left fourth and fifth fingers. No injuries.  ? ? ?Shoulder Pain ?Associated symptoms: no fever   ?Chest Pain ?Associated symptoms: numbness   ?Associated symptoms: no abdominal pain, no cough, no fever and no shortness of breath   ? ?  ? ?Home Medications ?Prior to Admission medications   ?Medication Sig Start Date End Date Taking? Authorizing Provider  ?methocarbamol (ROBAXIN) 500 MG tablet Take 1 tablet (500 mg total) by mouth 2 (two) times daily. 04/11/22  Yes Kayde Warehime T, PA-C  ?amLODipine (NORVASC) 10 MG tablet Take 1 tablet (10 mg total) by mouth daily. 11/13/19   Dessa Phi, DO  ?atorvastatin (LIPITOR) 20 MG tablet Take 20 mg by mouth daily. 03/15/21   [provider]  ?pantoprazole (PROTONIX) 40 MG tablet Take 1 tablet (40 mg total) by mouth daily. Please call [phone] to schedule an office visit for more refills. 03/14/21   Jackquline Denmark, MD  ?traZODone (DESYREL) 50 MG tablet Take 50 mg by mouth at bedtime as needed for sleep.  01/29/17   [provider]  ?Vitamin D, Ergocalciferol, (DRISDOL) 1.25 MG (50000 UNIT) CAPS capsule Take 50,000 Units by mouth once a week. 01/14/21   [provider]  ?   ? ?Allergies    ?Asa [aspirin] and Lisinopril   ? ?Review of  Systems   ?Review of Systems  ?Constitutional:  Negative for fever.  ?Respiratory:  Negative for cough and shortness of breath.   ?Cardiovascular:  Positive for chest pain.  ?Gastrointestinal:  Negative for abdominal pain.  ?Musculoskeletal:   ?     Left arm pain  ?Neurological:  Positive for numbness.  ?     Numbness in left fourth and fifth fingers  ?All other systems reviewed and are negative. ? ?Physical Exam ?Updated Vital Signs ?BP 109/71   Pulse 76   Temp 97.7 ?F (36.5 ?C) (Oral)   Resp 17   Ht 6' (1.829 m)   Wt 75 kg Comment: Pt walked to the scale and got weighed. Pt states weigh gain over the past two months.  SpO2 100%   BMI 22.42 kg/m?  ?Physical Exam ?Vitals and nursing note reviewed.  ?Constitutional:   ?   Appearance: Normal appearance.  ?HENT:  ?   Head: Normocephalic and atraumatic.  ?Eyes:  ?   Conjunctiva/sclera: Conjunctivae normal.  ?Cardiovascular:  ?   Rate and Rhythm: Normal rate and regular rhythm.  ?Pulmonary:  ?   Effort: Pulmonary effort is normal. No respiratory distress.  ?   Breath sounds: Normal breath sounds.  ?Abdominal:  ?   General: There is no distension.  ?   Palpations: Abdomen is soft.  ?   Tenderness: There  is no abdominal tenderness.  ?Musculoskeletal:  ?   Comments: No deformities, contusions, abrasions, punctures/penetrations, burns, tenderness, lacerations or swelling to the bilateral upper extremities. Some tenderness over the left shoulder blade. Normal grip strength bilaterally. Decreased sensation in the left 4th and 5th digits.  ? ?No midline spinal tenderness, step offs or crepitus.   ?Skin: ?   General: Skin is warm and dry.  ?Neurological:  ?   General: No focal deficit present.  ?   Mental Status: He is alert.  ? ? ?ED Results / Procedures / Treatments   ?Labs ?(all labs ordered are listed, but only abnormal results are displayed) ?Labs Reviewed  ?BASIC METABOLIC PANEL - Abnormal; Notable for the following components:  ?    Result Value  ? Potassium 3.2  (*)   ? Calcium 8.5 (*)   ? All other components within normal limits  ?CBC - Abnormal; Notable for the following components:  ? RBC 3.75 (*)   ? Hemoglobin 12.2 (*)   ? HCT 36.3 (*)   ? All other components within normal limits  ?TROPONIN I (HIGH SENSITIVITY)  ?TROPONIN I (HIGH SENSITIVITY)  ? ? ?EKG ?None ? ?Radiology ?DG Chest 2 View ? ?Result Date: 04/11/2022 ?CLINICAL DATA:  Left chest pain and shortness of breath EXAM: CHEST - 2 VIEW COMPARISON:  PA Lat 06/15/2021 FINDINGS: The heart size and mediastinal contours are within normal limits. Both lungs are clear of infiltrates with again noted surgical suture material in both medial apices and right apical linear scar-like opacities. The visualized skeletal structures are unremarkable. There are artifacts from overlying buttons. IMPRESSION: No evidence of acute chest disease or interval changes. Electronically Signed   By: Telford Nab M.D.   On: 04/11/2022 21:12   ? ?Procedures ?Procedures  ? ? ?Medications Ordered in ED ?Medications - No data to display ? ?ED Course/ Medical Decision Making/ A&P ?  ?                        ?Medical Decision Making ?Amount and/or Complexity of Data Reviewed ?Labs: ordered. ?Radiology: ordered. ? ?Risk ?Prescription drug management. ? ? ?This patient presents to the ED for concern of left arm pain and left-sided chest pain, this involves an extensive number of treatment options, and is a complaint that carries with it a high risk of complications and morbidity. The emergent differential diagnosis prior to evaluation includes, but is not limited to, ACS, pneumothorax, pulmonary embolism, pneumonia, radiculopathy, shoulder sprain, ligamentous injury.  This is not an exhaustive differential.  ? ?Past Medical History / Co-morbidities / Social History: ?Hypertension, pneumothorax, hyperlipidemia ? ?Physical Exam: ?Physical exam performed. The pertinent findings include: No deformities noted to the left shoulder, some tenderness over  the left shoulder blade.  Normal grip strength, decreased sensation to the left fourth and fifth fingers.  No anterior chest pain to palpation, lung sounds clear.  Normal vital signs. ? ?Lab Tests: ?I ordered, and personally interpreted labs.  The pertinent results include: No leukocytosis. Troponin of 4.  Mild hypokalemia 3.2, otherwise electrolytes within normal limits. ?  ?Imaging Studies: ?I ordered imaging studies including chest x-ray. I independently visualized and interpreted imaging which showed no acute cardiopulmonary abnormalities. I agree with the radiologist interpretation. ?  ?Cardiac Monitoring:  ?The patient was maintained on a cardiac monitor.  My attending physician Dr. Darl Householder viewed and interpreted the cardiac monitored which showed an underlying rhythm of: Sinus rhythm.  I agree with  this interpretation. ?  ?Disposition: ?After consideration of the diagnostic results and the patients response to treatment, I feel that patient's not requiring admission or inpatient treatment for symptoms.  After my evaluation I think that patient symptoms are likely musculoskeletal in origin.  As the symptoms seem more localized to his left shoulder and left arm versus his chest, and he has had an overall benign work-up, I very low suspicion for ACS or acute intrathoracic process.  He states that he does some heavy lifting at work, and think his symptoms could have stemmed from this.  He has not tried any medication for this, so I advised that we start some over-the-counter therapies as well as he would like to try muscle relaxer.  I think this is reasonable.  I think his symptoms are either related to shoulder sprain/ligamentous injury or cervical radiculopathy.  We will give him the contact information for orthopedics.  We discussed reasons to return to the emergency department, patient agrees to the plan. ? ?Final Clinical Impression(s) / ED Diagnoses ?Final diagnoses:  ?Acute pain of left shoulder   ?Radiculopathy, unspecified spinal region  ? ? ?Rx / DC Orders ?ED Discharge Orders   ? ?      Ordered  ?  methocarbamol (ROBAXIN) 500 MG tablet  2 times daily       ? 04/11/22 2254  ? ?  ?  ? ?  ? ?Portions of this report

## 2022-04-12 LAB — TROPONIN I (HIGH SENSITIVITY): Troponin I (High Sensitivity): 4 ng/L (ref <18)
